# Patient Record
Sex: Male | Born: 1965 | Race: Black or African American | Hispanic: No | Marital: Married | Smoking: Never smoker
Health system: Southern US, Community
[De-identification: ages and names within clinical notes are randomized; demographics above are authoritative.]

## PROBLEM LIST (undated history)

## (undated) DIAGNOSIS — I639 Cerebral infarction, unspecified: Secondary | ICD-10-CM

## (undated) DIAGNOSIS — N289 Disorder of kidney and ureter, unspecified: Secondary | ICD-10-CM

## (undated) DIAGNOSIS — M109 Gout, unspecified: Secondary | ICD-10-CM

## (undated) DIAGNOSIS — I10 Essential (primary) hypertension: Secondary | ICD-10-CM

## (undated) HISTORY — PX: ANKLE FRACTURE SURGERY: SHX122

---

## 2007-05-29 ENCOUNTER — Emergency Department (HOSPITAL_COMMUNITY): Admission: EM | Admit: 2007-05-29 | Discharge: 2007-05-29 | Payer: Self-pay | Admitting: Emergency Medicine

## 2008-08-05 ENCOUNTER — Ambulatory Visit (HOSPITAL_BASED_OUTPATIENT_CLINIC_OR_DEPARTMENT_OTHER): Admission: RE | Admit: 2008-08-05 | Discharge: 2008-08-05 | Payer: Self-pay | Admitting: Internal Medicine

## 2008-08-10 ENCOUNTER — Ambulatory Visit: Payer: Self-pay | Admitting: Internal Medicine

## 2008-11-25 ENCOUNTER — Ambulatory Visit: Payer: Self-pay | Admitting: Diagnostic Radiology

## 2008-11-26 ENCOUNTER — Encounter: Payer: Self-pay | Admitting: Emergency Medicine

## 2008-11-26 ENCOUNTER — Ambulatory Visit: Payer: Self-pay | Admitting: Internal Medicine

## 2008-11-26 ENCOUNTER — Ambulatory Visit: Payer: Self-pay | Admitting: Diagnostic Radiology

## 2008-11-26 ENCOUNTER — Inpatient Hospital Stay (HOSPITAL_COMMUNITY): Admission: EM | Admit: 2008-11-26 | Discharge: 2008-11-29 | Payer: Self-pay | Admitting: Internal Medicine

## 2009-03-27 ENCOUNTER — Emergency Department (HOSPITAL_COMMUNITY): Admission: EM | Admit: 2009-03-27 | Discharge: 2009-03-27 | Payer: Self-pay | Admitting: Emergency Medicine

## 2010-02-02 ENCOUNTER — Encounter: Payer: Self-pay | Admitting: Internal Medicine

## 2010-04-06 LAB — POCT I-STAT, CHEM 8
BUN: 12 mg/dL (ref 6–23)
Chloride: 112 mEq/L (ref 96–112)
Creatinine, Ser: <0.2 mg/dL — ABNORMAL LOW (ref 0.4–1.5)
Glucose, Bld: 97 mg/dL (ref 70–99)
Potassium: 5.4 mEq/L — ABNORMAL HIGH (ref 3.5–5.1)
Sodium: 132 mEq/L — ABNORMAL LOW (ref 135–145)

## 2010-04-06 LAB — DIFFERENTIAL
Basophils Absolute: 0 10*3/uL (ref 0.0–0.1)
Basophils Relative: 0 % (ref 0–1)
Eosinophils Relative: 0 % (ref 0–5)
Lymphocytes Relative: 23 % (ref 12–46)

## 2010-04-06 LAB — CBC
HCT: 40.2 % (ref 39.0–52.0)
MCHC: 33.1 g/dL (ref 30.0–36.0)
Platelets: 209 10*3/uL (ref 150–400)
RDW: 13.5 % (ref 11.5–15.5)

## 2010-04-06 LAB — URINALYSIS, ROUTINE W REFLEX MICROSCOPIC
Ketones, ur: NEGATIVE mg/dL
Leukocytes, UA: NEGATIVE
Nitrite: NEGATIVE
Protein, ur: 100 mg/dL — AB
Urobilinogen, UA: 1 mg/dL (ref 0.0–1.0)
pH: 7.5 (ref 5.0–8.0)

## 2010-04-06 LAB — URINE MICROSCOPIC-ADD ON

## 2010-04-15 LAB — BASIC METABOLIC PANEL
BUN: 12 mg/dL (ref 6–23)
BUN: 11 mg/dL (ref 6–23)
CO2: 25 mEq/L (ref 19–32)
CO2: 29 mEq/L (ref 19–32)
CO2: 30 mEq/L (ref 19–32)
CO2: 27 mEq/L (ref 19–32)
Calcium: 9.3 mg/dL (ref 8.4–10.5)
Chloride: 106 mEq/L (ref 96–112)
Chloride: 108 mEq/L (ref 96–112)
Chloride: 105 mEq/L (ref 96–112)
Creatinine, Ser: 2.03 mg/dL — ABNORMAL HIGH (ref 0.4–1.5)
Creatinine, Ser: 1.4 mg/dL (ref 0.4–1.5)
GFR calc Af Amer: 43 mL/min — ABNORMAL LOW (ref >60)
GFR calc Af Amer: 59 mL/min — ABNORMAL LOW (ref >60)
GFR calc Af Amer: >60 mL/min (ref >60)
GFR calc Af Amer: >60 mL/min (ref >60)
GFR calc non Af Amer: 36 mL/min — ABNORMAL LOW (ref >60)
GFR calc non Af Amer: 54 mL/min — ABNORMAL LOW (ref >60)
GFR calc non Af Amer: 55 mL/min — ABNORMAL LOW (ref >60)
Glucose, Bld: 104 mg/dL — ABNORMAL HIGH (ref 70–99)
Glucose, Bld: 94 mg/dL (ref 70–99)
Glucose, Bld: 98 mg/dL (ref 70–99)
Potassium: 4.1 mEq/L (ref 3.5–5.1)
Potassium: 4.2 mEq/L (ref 3.5–5.1)
Potassium: 4.3 mEq/L (ref 3.5–5.1)
Potassium: 3.9 mEq/L (ref 3.5–5.1)
Sodium: 138 mEq/L (ref 135–145)
Sodium: 139 mEq/L (ref 135–145)
Sodium: 141 mEq/L (ref 135–145)
Sodium: 142 mEq/L (ref 135–145)

## 2010-04-15 LAB — SODIUM, URINE, RANDOM: Sodium, Ur: 26 mEq/L

## 2010-04-15 LAB — URINALYSIS, ROUTINE W REFLEX MICROSCOPIC
Bilirubin Urine: NEGATIVE
Glucose, UA: NEGATIVE mg/dL
Hgb urine dipstick: NEGATIVE
Ketones, ur: NEGATIVE mg/dL
pH: 5.5 (ref 5.0–8.0)

## 2010-04-15 LAB — CBC
HCT: 32.6 % — ABNORMAL LOW (ref 39.0–52.0)
HCT: 33.4 % — ABNORMAL LOW (ref 39.0–52.0)
HCT: 34.3 % — ABNORMAL LOW (ref 39.0–52.0)
HCT: 38.8 % — ABNORMAL LOW (ref 39.0–52.0)
Hemoglobin: 10.9 g/dL — ABNORMAL LOW (ref 13.0–17.0)
Hemoglobin: 10.9 g/dL — ABNORMAL LOW (ref 13.0–17.0)
Hemoglobin: 11.3 g/dL — ABNORMAL LOW (ref 13.0–17.0)
Hemoglobin: 12.8 g/dL — ABNORMAL LOW (ref 13.0–17.0)
MCHC: 33.4 g/dL (ref 30.0–36.0)
MCHC: 32.9 g/dL (ref 30.0–36.0)
MCV: 89.9 fL (ref 78.0–100.0)
MCV: 90.4 fL (ref 78.0–100.0)
MCV: 89.1 fL (ref 78.0–100.0)
Platelets: 226 10*3/uL (ref 150–400)
Platelets: 231 10*3/uL (ref 150–400)
RBC: 3.63 MIL/uL — ABNORMAL LOW (ref 4.22–5.81)
RBC: 3.71 MIL/uL — ABNORMAL LOW (ref 4.22–5.81)
RBC: 3.99 MIL/uL — ABNORMAL LOW (ref 4.22–5.81)
RBC: 4.36 MIL/uL (ref 4.22–5.81)
RDW: 13.8 % (ref 11.5–15.5)
RDW: 14.2 % (ref 11.5–15.5)
RDW: 13.1 % (ref 11.5–15.5)
WBC: 7.3 10*3/uL (ref 4.0–10.5)
WBC: 8.1 10*3/uL (ref 4.0–10.5)

## 2010-04-15 LAB — DIFFERENTIAL
Basophils Absolute: 0 10*3/uL (ref 0.0–0.1)
Basophils Relative: 0 % (ref 0–1)
Basophils Relative: 1 % (ref 0–1)
Eosinophils Absolute: 0.1 10*3/uL (ref 0.0–0.7)
Eosinophils Absolute: 0 10*3/uL (ref 0.0–0.7)
Eosinophils Relative: 1 % (ref 0–5)
Eosinophils Relative: 1 % (ref 0–5)
Lymphocytes Relative: 35 % (ref 12–46)
Lymphocytes Relative: 23 % (ref 12–46)
Lymphs Abs: 1.9 10*3/uL (ref 0.7–4.0)
Monocytes Absolute: 0.7 10*3/uL (ref 0.1–1.0)
Monocytes Relative: 6 % (ref 3–12)
Monocytes Relative: 9 % (ref 3–12)
Neutro Abs: 4.1 10*3/uL (ref 1.7–7.7)
Neutro Abs: 5.5 10*3/uL (ref 1.7–7.7)
Neutrophils Relative %: 67 % (ref 43–77)

## 2010-04-15 LAB — POCT CARDIAC MARKERS
CKMB, poc: <1 ng/mL — ABNORMAL LOW (ref 1.0–8.0)
CKMB, poc: <1 ng/mL — ABNORMAL LOW (ref 1.0–8.0)
Myoglobin, poc: 83.6 ng/mL (ref 12–200)
Myoglobin, poc: 99.5 ng/mL (ref 12–200)
Troponin i, poc: <0.05 ng/mL (ref 0.00–0.09)
Troponin i, poc: <0.05 ng/mL (ref 0.00–0.09)

## 2010-04-15 LAB — URINE MICROSCOPIC-ADD ON

## 2010-04-15 LAB — RETICULOCYTES
RBC.: 4.09 MIL/uL — ABNORMAL LOW (ref 4.22–5.81)
Retic Count, Absolute: 81.8 10*3/uL (ref 19.0–186.0)
Retic Ct Pct: 2 % (ref 0.4–3.1)

## 2010-04-15 LAB — APTT: aPTT: 29 seconds (ref 24–37)

## 2010-04-15 LAB — HEPATIC FUNCTION PANEL
AST: 12 U/L (ref 0–37)
Albumin: 3.7 g/dL (ref 3.5–5.2)
Alkaline Phosphatase: 19 U/L — ABNORMAL LOW (ref 39–117)
Total Bilirubin: 0.5 mg/dL (ref 0.3–1.2)
Total Protein: 7.1 g/dL (ref 6.0–8.3)

## 2010-04-15 LAB — LIPID PANEL
Cholesterol: 207 mg/dL — ABNORMAL HIGH (ref 0–200)
HDL: 34 mg/dL — ABNORMAL LOW (ref >39)
LDL Cholesterol: 150 mg/dL — ABNORMAL HIGH (ref 0–99)
Total CHOL/HDL Ratio: 6.1 RATIO

## 2010-04-15 LAB — VITAMIN B12: Vitamin B-12: 219 pg/mL (ref 211–911)

## 2010-04-15 LAB — PROTIME-INR
INR: 1.08 (ref 0.00–1.49)
INR: 1.19 (ref 0.00–1.49)
INR: 1.06 (ref 0.00–1.49)
Prothrombin Time: 13.9 seconds (ref 11.6–15.2)
Prothrombin Time: 13.7 seconds (ref 11.6–15.2)

## 2010-04-15 LAB — CARDIAC PANEL(CRET KIN+CKTOT+MB+TROPI)
CK, MB: 0.9 ng/mL (ref 0.3–4.0)
Relative Index: 0.7 (ref 0.0–2.5)
Total CK: 123 U/L (ref 7–232)
Total CK: 137 U/L (ref 7–232)
Troponin I: 0.02 ng/mL (ref 0.00–0.06)

## 2010-04-15 LAB — TSH: TSH: 1.316 u[IU]/mL (ref 0.350–4.500)

## 2010-04-15 LAB — URINE CULTURE

## 2010-04-15 LAB — HEPARIN LEVEL (UNFRACTIONATED)
Heparin Unfractionated: 0.44 IU/mL (ref 0.30–0.70)
Heparin Unfractionated: 0.49 IU/mL (ref 0.30–0.70)

## 2010-04-15 LAB — IRON AND TIBC: Iron: 107 ug/dL (ref 42–135)

## 2010-05-26 NOTE — Procedures (Signed)
NAME:  DECARI, DUGGAR NO.:  [id number]   MEDICAL RECORD NO.:  [id number]          PATIENT TYPE:  OUT   LOCATION:  SLEEP CENTER                 FACILITY:  [facility]   PHYSICIAN:  Clinton D. Maple Hudson, MD, FCCP, FACPDATE OF BIRTH:  06/20/1965   DATE OF STUDY:  08/05/2008                            NOCTURNAL POLYSOMNOGRAM   REFERRING PHYSICIAN:  Ike Chukwu [name]   INDICATION FOR STUDY:  Hypersomnia with sleep apnea.   EPWORTH SLEEPINESS SCORE:  Epworth sleepiness score 4/24, BMI 25.1.  Weight 180 pounds, height 71 inches.  Neck 16 inches.   MEDICATIONS:  Home medication charted and reviewed.   SLEEP ARCHITECTURE:  Total sleep time 321.5 minutes with sleep  efficiency 89.8%.  Stage I was 7.9%, stage II 59.7%, stage III 13.7%,  REM 18.7% of total sleep time.  Sleep latency 7 minutes.  REM latency  64.5 minutes.  Awake after sleep onset 29.5 minutes.  Arousal index 22.  No bedtime medication taken.   RESPIRATORY DATA:  Apnea/hypopnea index (AHI) 5.2 per hour.  A total of  28 events were scored including 6 obstructive apneas and 22 hypopneas.  Events were significantly more common while supine.  REM AHI 2 per hour.  An additional 34 marginal events, not meeting duration and intensity  criteria to be scored as apneas or hypopneas, were recognized as  respiratory events related to arousal for a cumulative respiratory  disturbance index (RDI) of 11.6 per hour.  This was a diagnostic NPSG  study.   OXYGEN DATA:  Moderately loud snoring with oxygen desaturation to a  nadir of 89%.  Mean oxygen saturation through the study was 96.6% on  room air.   CARDIAC DATA:  Sinus rhythm.   MOVEMENT-PARASOMNIA:  No movement disturbance.  No bathroom trips.   IMPRESSIONS-RECOMMENDATIONS:  1. Minimal obstructive sleep apnea/hypopnea syndrome, AHI 5.2 per hour      (normal range 0-5 per hour), RDI 11.6 per hour.  Events were      related to supine sleep position.  Loud snoring with  oxygen      desaturation to a nadir of 89% on room air.  2. Current scoring rules would recognize an RDI of 11.6 to be      consistent with a diagnosis of mild obstructive sleep      apnea/hypopnea syndrome which can be addressed with a trial of CPAP      therapy if otherwise, clinically      appropriate and if more conservative measures are insufficient.      Consider return for CPAP titration or evaluate for alternative      management as indicated.      Clinton D. Maple Hudson, MD, [facility], FACP  Diplomate, Biomedical engineer of Sleep Medicine  Electronically Signed     CDY/MEDQ  D:  08/11/2008 09:56:41  T:  08/11/2008 11:00:01  Job:  [id number]

## 2011-03-02 ENCOUNTER — Emergency Department (INDEPENDENT_AMBULATORY_CARE_PROVIDER_SITE_OTHER): Payer: BC Managed Care – PPO

## 2011-03-02 ENCOUNTER — Emergency Department (HOSPITAL_BASED_OUTPATIENT_CLINIC_OR_DEPARTMENT_OTHER)
Admission: EM | Admit: 2011-03-02 | Discharge: 2011-03-02 | Disposition: A | Discharge status: Home / Self Care | Payer: BC Managed Care – PPO | Attending: Emergency Medicine | Admitting: Emergency Medicine

## 2011-03-02 ENCOUNTER — Encounter (HOSPITAL_BASED_OUTPATIENT_CLINIC_OR_DEPARTMENT_OTHER): Payer: Self-pay | Admitting: *Deleted

## 2011-03-02 ENCOUNTER — Other Ambulatory Visit: Payer: Self-pay

## 2011-03-02 DIAGNOSIS — R0602 Shortness of breath: Secondary | ICD-10-CM

## 2011-03-02 DIAGNOSIS — I10 Essential (primary) hypertension: Secondary | ICD-10-CM | POA: Insufficient documentation

## 2011-03-02 DIAGNOSIS — R0789 Other chest pain: Secondary | ICD-10-CM

## 2011-03-02 DIAGNOSIS — R079 Chest pain, unspecified: Secondary | ICD-10-CM

## 2011-03-02 HISTORY — DX: Essential (primary) hypertension: I10

## 2011-03-02 LAB — COMPREHENSIVE METABOLIC PANEL
ALT: 13 U/L (ref 0–53)
AST: 24 U/L (ref 0–37)
Albumin: 4.2 g/dL (ref 3.5–5.2)
Alkaline Phosphatase: 29 U/L — ABNORMAL LOW (ref 39–117)
BUN: 10 mg/dL (ref 6–23)
Chloride: 101 mEq/L (ref 96–112)
Potassium: 3.8 mEq/L (ref 3.5–5.1)
Sodium: 139 mEq/L (ref 135–145)
Total Bilirubin: 0.3 mg/dL (ref 0.3–1.2)
Total Protein: 7.6 g/dL (ref 6.0–8.3)

## 2011-03-02 LAB — DIFFERENTIAL
Basophils Absolute: 0 10*3/uL (ref 0.0–0.1)
Basophils Relative: 0 % (ref 0–1)
Eosinophils Absolute: 0.1 10*3/uL (ref 0.0–0.7)
Monocytes Relative: 10 % (ref 3–12)
Neutro Abs: 3.6 10*3/uL (ref 1.7–7.7)
Neutrophils Relative %: 51 % (ref 43–77)

## 2011-03-02 LAB — CBC
Hemoglobin: 12.7 g/dL — ABNORMAL LOW (ref 13.0–17.0)
MCH: 29.1 pg (ref 26.0–34.0)
MCHC: 34.7 g/dL (ref 30.0–36.0)
Platelets: 201 10*3/uL (ref 150–400)
RDW: 12.5 % (ref 11.5–15.5)

## 2011-03-02 LAB — TROPONIN I: Troponin I: <0.3 ng/mL (ref <0.30)

## 2011-03-02 NOTE — ED Notes (Signed)
Pt to room 1, pt is alert and in nad. Pt reports chest "burning" since Sunday, worse after eating and with laying down at night. Denies any sob. Pt states "burning" also increases with lying on side.

## 2011-03-02 NOTE — Discharge Instructions (Signed)
Prilosec 20 mg twice daily for the next two weeks.      Chest Pain (Nonspecific) It is often hard to give a specific diagnosis for the cause of chest pain. There is always a chance that your pain could be related to something serious, such as a heart attack or a blood clot in the lungs. You need to follow up with your caregiver for further evaluation. CAUSES   Heartburn.   Pneumonia or bronchitis.   Anxiety and stress.   Inflammation around your heart (pericarditis) or lung (pleuritis or pleurisy).   A blood clot in the lung.   A collapsed lung (pneumothorax). It can develop suddenly on its own (spontaneous pneumothorax) or from injury (trauma) to the chest.  The chest wall is composed of bones, muscles, and cartilage. Any of these can be the source of the pain.  The bones can be bruised by injury.   The muscles or cartilage can be strained by coughing or overwork.   The cartilage can be affected by inflammation and become sore (costochondritis).  DIAGNOSIS  Lab tests or other studies, such as X-rays, an EKG, stress testing, or cardiac imaging, may be needed to find the cause of your pain.  TREATMENT   Treatment depends on what may be causing your chest pain. Treatment may include:   Acid blockers for heartburn.   Anti-inflammatory medicine.   Pain medicine for inflammatory conditions.   Antibiotics if an infection is present.   You may be advised to change lifestyle habits. This includes stopping smoking and avoiding caffeine and chocolate.   You may be advised to keep your head raised (elevated) when sleeping. This reduces the chance of acid going backward from your stomach into your esophagus.   Most of the time, nonspecific chest pain will improve within 2 to 3 days with rest and mild pain medicine.  HOME CARE INSTRUCTIONS   If antibiotics were prescribed, take the full amount even if you start to feel better.   For the next few days, avoid physical activities  that bring on chest pain. Continue physical activities as directed.   Do not smoke cigarettes or drink alcohol until your symptoms are gone.   Only take over-the-counter or prescription medicine for pain, discomfort, or fever as directed by your caregiver.   Follow your caregiver's suggestions for further testing if your chest pain does not go away.   Keep any follow-up appointments you made. If you do not go to an appointment, you could develop lasting (chronic) problems with pain. If there is any problem keeping an appointment, you must call to reschedule.  SEEK MEDICAL CARE IF:   You think you are having problems from the medicine you are taking. Read your medicine instructions carefully.   Your chest pain does not go away, even after treatment.   You develop a rash with blisters on your chest.  SEEK IMMEDIATE MEDICAL CARE IF:   You have increased chest pain or pain that spreads to your arm, neck, jaw, back, or belly (abdomen).   You develop shortness of breath, an increasing cough, or you are coughing up blood.   You have severe back or abdominal pain, feel sick to your stomach (nauseous) or throw up (vomit).   You develop severe weakness, fainting, or chills.   You have an oral temperature above 102 F (38.9 C), not controlled by medicine.  THIS IS AN EMERGENCY. Do not wait to see if the pain will go away. Get medical help  at once. Call your local emergency services (911 in [place] Do not drive yourself to the hospital. MAKE SURE YOU:   Understand these instructions.   Will watch your condition.   Will get help right away if you are not doing well or get worse.  Document Released: 10/07/2004 Document Revised: 09/09/2010 Document Reviewed: 08/03/2007 [facility] Patient Information 2012 [place], [place].

## 2011-03-02 NOTE — ED Provider Notes (Signed)
History     CSN: [id number]  Arrival date & time 03/02/11  1753   First MD Initiated Contact with Patient 03/02/11 1814      Chief Complaint  Patient presents with  . Chest Pain    (Consider location/radiation/quality/duration/timing/severity/associated sxs/prior treatment) HPI Comments: Several day history of "burning in chest".  Worse with position, lying flat, certain foods.  Denies sob, diaphoresis, nausea, or radiation to arm or jaw.  No exertional component.  Had normal heart cath 2 years ago.  Patient is a 46 y.o. male presenting with chest pain. The history is provided by the patient.  Chest Pain The severity of the pain is moderate. The quality of the pain is described as burning. The pain does not radiate. Chest pain is worsened by certain positions. Pertinent negatives for primary symptoms include no fever, no cough, no nausea, no vomiting and no dizziness. He tried nothing for the symptoms.     Past Medical History  Diagnosis Date  . Hypertension     History reviewed. No pertinent past surgical history.  History reviewed. No pertinent family history.  History  Substance Use Topics  . Smoking status: Never Smoker   . Smokeless tobacco: Not on file  . Alcohol Use: No      Review of Systems  Constitutional: Negative for fever.  Respiratory: Negative for cough.   Cardiovascular: Positive for chest pain.  Gastrointestinal: Negative for nausea and vomiting.  Neurological: Negative for dizziness.  All other systems reviewed and are negative.    Allergies  Review of patient's allergies indicates no known allergies.  Home Medications  No current outpatient prescriptions on file.  There were no vitals taken for this visit.  Physical Exam  Nursing note and vitals reviewed. Constitutional: He is oriented to person, place, and time. He appears well-developed and well-nourished. No distress.  HENT:  Head: Normocephalic and atraumatic.  Mouth/Throat:  Oropharynx is clear and moist.  Neck: Normal range of motion. Neck supple.  Cardiovascular: Normal rate and regular rhythm.   No murmur heard. Pulmonary/Chest: Effort normal and breath sounds normal. No respiratory distress. He has no wheezes.  Abdominal: Soft. Bowel sounds are normal. He exhibits no distension. There is no tenderness.  Musculoskeletal: Normal range of motion. He exhibits no edema.  Neurological: He is alert and oriented to person, place, and time.  Skin: Skin is warm and dry. He is not diaphoretic.    ED Course  Procedures (including critical care time)  Labs Reviewed - No data to display No results found.   No diagnosis found.   Date: 03/02/2011  Rate: 89  Rhythm: normal sinus rhythm  QRS Axis: left  Intervals: normal  ST/T Wave abnormalities: normal  Conduction Disutrbances:none  Narrative Interpretation:   Old EKG Reviewed: unchanged    MDM  The labs and ekg look okay with the exception of the glucose of 60 (he was given food).  The chest xray looks okay as well.  He had a cath done 2 years ago that was unremarkable.  Given that and the fact that his symptoms are atypical, I believe the patient can be safely discharged.  I will recommend he try prilosec twice daily for the next two weeks and follow up prn.        Geoffery Lyons, MD 03/02/11 [phone]

## 2011-10-13 ENCOUNTER — Emergency Department (HOSPITAL_BASED_OUTPATIENT_CLINIC_OR_DEPARTMENT_OTHER)
Admission: EM | Admit: 2011-10-13 | Discharge: 2011-10-14 | Disposition: A | Discharge status: Home / Self Care | Payer: BC Managed Care – PPO | Attending: Emergency Medicine | Admitting: Emergency Medicine

## 2011-10-13 ENCOUNTER — Encounter (HOSPITAL_BASED_OUTPATIENT_CLINIC_OR_DEPARTMENT_OTHER): Payer: Self-pay | Admitting: *Deleted

## 2011-10-13 DIAGNOSIS — R51 Headache: Secondary | ICD-10-CM

## 2011-10-13 DIAGNOSIS — I10 Essential (primary) hypertension: Secondary | ICD-10-CM

## 2011-10-13 MED ORDER — LISINOPRIL 10 MG PO TABS
10.0000 mg | ORAL_TABLET | Freq: Once | ORAL | Status: AC
  Administered 2011-10-13: 10 mg via ORAL
  Filled 2011-10-13: qty 1

## 2011-10-13 MED ORDER — ACETAMINOPHEN 325 MG PO TABS
650.0000 mg | ORAL_TABLET | Freq: Once | ORAL | Status: AC
  Administered 2011-10-13: 650 mg via ORAL
  Filled 2011-10-13: qty 2

## 2011-10-13 NOTE — ED Notes (Addendum)
Pt c/o h/a  Today not taking  BP meds

## 2011-10-13 NOTE — ED Provider Notes (Signed)
History     CSN: [id number]  Arrival date & time 10/13/11  4098   First MD Initiated Contact with Patient 10/13/11 2140      Chief Complaint  Patient presents with  . Headache    (Consider location/radiation/quality/duration/timing/severity/associated sxs/prior treatment) HPI Kevin Jefferson is a 46 y.o. male past medical history of hypertension who has previously seen a nephrologist and was previously taking 3 separate pills, for medications for hypertension and has not taken any of them in the last 2 days.  Patient was at [facility] when he noticed his blood pressure was elevated. He said he would come to the emergency department for further evaluation. Just complaining about headache he says is 8/10, as always associated with an elevated blood pressure, had a slow onset, typically is relieved by pain medicine and laying down, is located in the frontal region and all over the top of the head, is a squeezing headache, not associated with changes in vision, numbness, tingling, dysarthria or weakness. Patient denies any chest pain or shortness of breath, no nausea vomiting or diarrhea.   Past Medical History  Diagnosis Date  . Hypertension     History reviewed. No pertinent past surgical history.  History reviewed. No pertinent family history.  History  Substance Use Topics  . Smoking status: Never Smoker   . Smokeless tobacco: Not on file  . Alcohol Use: No      Review of Systems At least 10pt or greater review of systems completed and are negative except where specified in the HPI.  Allergies  Review of patient's allergies indicates no known allergies.  Home Medications   Current Outpatient Rx  Name Route Sig Dispense Refill  . AMLODIPINE BESYLATE 10 MG PO TABS Oral Take 10 mg by mouth daily.    . BUPROPION HCL ER (SR) 150 MG PO TB12 Oral Take 150 mg by mouth 2 (two) times daily.    Marland Kitchen LISINOPRIL 10 MG PO TABS Oral Take 10 mg by mouth 2 (two) times daily.    .  TRIAMTERENE-HCTZ 37.5-25 MG PO CAPS Oral Take 1 capsule by mouth every morning.      BP 176/109  Pulse 78  Temp 98.7 F (37.1 C) (Oral)  Resp 20  Ht 5\' 11"  (1.803 m)  Wt 180 lb (81.647 kg)  BMI 25.10 kg/m2  SpO2 99%  Physical Exam  Nursing notes reviewed.  Electronic medical record reviewed. VITAL SIGNS:   Filed Vitals:   10/13/11 2004 10/13/11 2142  BP: 181/119 176/109  Pulse: 76 78  Temp: 98.7 F (37.1 C)   TempSrc: Oral   Resp: 20   Height: 5\' 11"  (1.803 m)   Weight: 180 lb (81.647 kg)   SpO2: 99% 99%   CONSTITUTIONAL: Awake, oriented, appears non-toxic HENT: Atraumatic, normocephalic, oral mucosa pink and moist, airway patent. Nares patent without drainage. External ears normal. EYES: Conjunctiva clear, EOMI, PERRLA NECK: Trachea midline, non-tender, supple CARDIOVASCULAR: Normal heart rate, Normal rhythm, No murmurs, rubs, gallops PULMONARY/CHEST: Clear to auscultation, no rhonchi, wheezes, or rales. Symmetrical breath sounds. Non-tender. ABDOMINAL: Non-distended, soft, non-tender - no rebound or guarding.  BS normal. NEUROLOGIC:CN: Facial sensation equal to light touch bilaterally.  Good muscle bulk in the masseter muscle and good lateral movement of the jaw.  Facial expressions equal and good strength with smile/frown and puffed cheeks.  Hearing grossly intact to finger rub test.  Uvula, tongue are midline with no deviation. Symmetrical palate elevation.  Trapezius and SCM muscles are 5/5 strength bilaterally.  Strength: 5/5 strength flexors and extensors in the upper and lower extremities.  Grip strength, finger adduction/abduction 5/5. Sensation: Sensation intact distally to light touch Cerebellar: No ataxia with walking or dysmetria with finger to nose, rapid alternating hand movements and heels to shin testing. EXTREMITIES: No clubbing, cyanosis, or edema SKIN: Warm, Dry, No erythema, No rash  ED Course  Procedures (including critical care time)  Labs  Reviewed - No data to display No results found.   No diagnosis found.  MDM  Nichole Keltner is a 46 y.o. male with hypertension who has not taken his hypertensive medications in 2 days and presents to the ER because he is concerned that he has high blood pressure and a headache.  Patient was counseled that if he does not take his blood pressure medication, his blood pressure will likely be elevated. Patient was encouraged to make lifestyle changes increasing healthy diet and increasing exercise to potentially reduce the number of medications he must take. He is urged to followup with his nephrologist or primary care physician of choice. Given him one lisinopril which he says he's been taking because he has not been taking the other medications. I do not suspect any end organ damage at this time and do not think any laboratory studies are needed as the patient does not have any chest pain, cardiac symptoms, and he has a normal neurologic exam  I explained the diagnosis and have given explicit precautions to return to the ER including chest pain, shortness of breath, weakness, numbness, dysarthria  or any other new or worsening symptoms. The patient understands and accepts the medical plan as it's been dictated and I have answered their questions. Discharge instructions concerning home care and prescriptions have been given.  The patient is STABLE and is discharged to home in good condition.       Jones Skene, MD 10/20/11 1147

## 2011-10-29 DIAGNOSIS — I517 Cardiomegaly: Secondary | ICD-10-CM | POA: Insufficient documentation

## 2012-02-13 ENCOUNTER — Encounter (HOSPITAL_BASED_OUTPATIENT_CLINIC_OR_DEPARTMENT_OTHER): Payer: Self-pay | Admitting: *Deleted

## 2012-02-13 ENCOUNTER — Emergency Department (HOSPITAL_BASED_OUTPATIENT_CLINIC_OR_DEPARTMENT_OTHER)
Admission: EM | Admit: 2012-02-13 | Discharge: 2012-02-13 | Disposition: A | Discharge status: Home / Self Care | Payer: BC Managed Care – PPO | Attending: Emergency Medicine | Admitting: Emergency Medicine

## 2012-02-13 ENCOUNTER — Emergency Department (HOSPITAL_BASED_OUTPATIENT_CLINIC_OR_DEPARTMENT_OTHER): Payer: BC Managed Care – PPO

## 2012-02-13 DIAGNOSIS — Y939 Activity, unspecified: Secondary | ICD-10-CM | POA: Insufficient documentation

## 2012-02-13 DIAGNOSIS — W010XXA Fall on same level from slipping, tripping and stumbling without subsequent striking against object, initial encounter: Secondary | ICD-10-CM | POA: Insufficient documentation

## 2012-02-13 DIAGNOSIS — I10 Essential (primary) hypertension: Secondary | ICD-10-CM | POA: Insufficient documentation

## 2012-02-13 DIAGNOSIS — S7000XA Contusion of unspecified hip, initial encounter: Secondary | ICD-10-CM | POA: Insufficient documentation

## 2012-02-13 DIAGNOSIS — Y929 Unspecified place or not applicable: Secondary | ICD-10-CM | POA: Insufficient documentation

## 2012-02-13 MED ORDER — HYDROCODONE-ACETAMINOPHEN 5-325 MG PO TABS: 1.0000 | ORAL_TABLET | Freq: Four times a day (QID) | ORAL | Status: DC | PRN

## 2012-02-13 MED ORDER — NAPROXEN 500 MG PO TABS: 500.0000 mg | ORAL_TABLET | Freq: Two times a day (BID) | ORAL | Status: DC

## 2012-02-13 NOTE — ED Provider Notes (Signed)
History  This chart was scribed for Shelda Jakes, MD by Erskine Emery, ED Scribe. This patient was seen in room MH01/MH01 and the patient's care was started at 21:08.   CSN: [id number]  Arrival date & time 02/13/12  2054   First MD Initiated Contact with Patient 02/13/12 2108      Chief Complaint  Patient presents with  . Hip Injury    (Consider location/radiation/quality/duration/timing/severity/associated sxs/prior treatment) The history is provided by the patient. No language interpreter was used.  Kevin Jefferson is a 47 y.o. male who presents to the Emergency Department complaining of right hip and waist line pain since he tripped on a curb and fell from standing onto his right side yesterday. Pt reports the pain is about an 8/10 when moving and last night it felt like the whole front of his leg was burning. Pt reports some back pain upon walking and right leg pain but denies any associated abdominal pain, nausea, emesis, diarrhea, neck pain, headache, visual changes, dysuria, hematuria, rash, h/o bleeding easily, injury to the area in the past, or pain in foot.  Past Medical History  Diagnosis Date  . Hypertension     History reviewed. No pertinent past surgical history.  History reviewed. No pertinent family history.  History  Substance Use Topics  . Smoking status: Never Smoker   . Smokeless tobacco: Not on file  . Alcohol Use: No      Review of Systems  Constitutional: Negative for fever and chills.  HENT: Negative for congestion, rhinorrhea and neck pain.   Eyes: Negative for visual disturbance.  Respiratory: Negative for cough and shortness of breath.   Cardiovascular: Negative for chest pain.  Gastrointestinal: Negative for nausea, vomiting and diarrhea.  Genitourinary: Negative for dysuria and hematuria.  Musculoskeletal: Positive for back pain.       Right hip pain. Right leg pain and burning.  Skin: Negative for rash.  Neurological: Negative for  headaches.  Hematological: Does not bruise/bleed easily.  Psychiatric/Behavioral: Negative for sleep disturbance.  All other systems reviewed and are negative.    Allergies  Review of patient's allergies indicates no known allergies.  Home Medications   Current Outpatient Rx  Name  Route  Sig  Dispense  Refill  . AMLODIPINE BESYLATE 10 MG PO TABS   Oral   Take 10 mg by mouth daily.         . BUPROPION HCL ER (SR) 150 MG PO TB12   Oral   Take 150 mg by mouth 2 (two) times daily.         Marland Kitchen HYDROCODONE-ACETAMINOPHEN 5-325 MG PO TABS   Oral   Take 1-2 tablets by mouth every 6 (six) hours as needed for pain.   14 tablet   0   . LISINOPRIL 10 MG PO TABS   Oral   Take 10 mg by mouth 2 (two) times daily.         Marland Kitchen NAPROXEN 500 MG PO TABS   Oral   Take 1 tablet (500 mg total) by mouth 2 (two) times daily.   14 tablet   0   . TRIAMTERENE-HCTZ 37.5-25 MG PO CAPS   Oral   Take 1 capsule by mouth every morning.           Triage Vitals: BP 143/81  Pulse 97  Temp 98.5 F (36.9 C) (Oral)  Resp 20  Ht 5\' 11"  (1.803 m)  Wt 180 lb (81.647 kg)  BMI 25.10 kg/m2  SpO2 96%  Physical Exam  Nursing note and vitals reviewed. Constitutional: He is oriented to person, place, and time. He appears well-developed and well-nourished. No distress.  HENT:  Head: Normocephalic and atraumatic.  Eyes: EOM are normal. Pupils are equal, round, and reactive to light.  Neck: Neck supple. No tracheal deviation present.  Cardiovascular: Normal rate, regular rhythm and normal heart sounds.   No murmur heard. Pulmonary/Chest: Effort normal and breath sounds normal. No respiratory distress. He has no wheezes.       Lungs are clear.  Abdominal: Soft. Bowel sounds are normal. He exhibits no distension.       No RUQ, RLQ, or left-sided pain. Pain at groin crease on right.  Musculoskeletal: Normal range of motion. He exhibits no edema.       2+ DP pulse on both feet.  Neurological: He is  alert and oriented to person, place, and time.  Skin: Skin is warm and dry.  Psychiatric: He has a normal mood and affect.    ED Course  Procedures (including critical care time) DIAGNOSTIC STUDIES: Oxygen Saturation is 96% on room air, adeqwuate by my interpretation.    COORDINATION OF CARE: 21:20--I evaluated the patient and we discussed a treatment plan including hip x-ray to which the pt agreed.   Dg Hip Complete Right  02/13/2012  *RADIOLOGY REPORT*  Clinical Data: Larey Seat 1 day ago.  Right hip pain.  RIGHT HIP - COMPLETE 2+ VIEW  Comparison: None.  Findings: No fracture or dislocation.  IMPRESSION: No fracture.   Original Report Authenticated By: Lacy Duverney, M.D.       1. Contusion, hip       MDM   X-rays are negative for any bony injury to the right hip suspect a contusion to the hip area. No evidence of any intra-abdominal injury on exam. We'll treat with anti-inflammatories and pain medication patient will followup if not improving in one week.      I personally performed the services described in this documentation, which was scribed in my presence. The recorded information has been reviewed and is accurate.     Shelda Jakes, MD 02/14/12 [phone]

## 2012-02-13 NOTE — ED Notes (Signed)
Pt states he fell yesterday and injured his right hip. Ambulatory with limp. Also c/o right side pain.

## 2012-02-13 NOTE — ED Notes (Signed)
Transported to xray 

## 2012-02-13 NOTE — ED Notes (Signed)
Returned from xray

## 2012-06-04 ENCOUNTER — Encounter (HOSPITAL_BASED_OUTPATIENT_CLINIC_OR_DEPARTMENT_OTHER): Payer: Self-pay

## 2012-06-04 ENCOUNTER — Emergency Department (HOSPITAL_BASED_OUTPATIENT_CLINIC_OR_DEPARTMENT_OTHER): Payer: BC Managed Care – PPO

## 2012-06-04 ENCOUNTER — Emergency Department (HOSPITAL_BASED_OUTPATIENT_CLINIC_OR_DEPARTMENT_OTHER)
Admission: EM | Admit: 2012-06-04 | Discharge: 2012-06-04 | Disposition: A | Discharge status: Home / Self Care | Payer: BC Managed Care – PPO | Attending: Emergency Medicine | Admitting: Emergency Medicine

## 2012-06-04 DIAGNOSIS — R112 Nausea with vomiting, unspecified: Secondary | ICD-10-CM | POA: Insufficient documentation

## 2012-06-04 DIAGNOSIS — Z79899 Other long term (current) drug therapy: Secondary | ICD-10-CM | POA: Insufficient documentation

## 2012-06-04 DIAGNOSIS — K5900 Constipation, unspecified: Secondary | ICD-10-CM | POA: Insufficient documentation

## 2012-06-04 DIAGNOSIS — I10 Essential (primary) hypertension: Secondary | ICD-10-CM | POA: Insufficient documentation

## 2012-06-04 DIAGNOSIS — R109 Unspecified abdominal pain: Secondary | ICD-10-CM | POA: Insufficient documentation

## 2012-06-04 LAB — CBC WITH DIFFERENTIAL/PLATELET
Hemoglobin: 12.1 g/dL — ABNORMAL LOW (ref 13.0–17.0)
Lymphs Abs: 2.3 10*3/uL (ref 0.7–4.0)
MCH: 29.3 pg (ref 26.0–34.0)
Monocytes Relative: 11 % (ref 3–12)
Neutro Abs: 4.4 10*3/uL (ref 1.7–7.7)
Neutrophils Relative %: 58 % (ref 43–77)
RBC: 4.13 MIL/uL — ABNORMAL LOW (ref 4.22–5.81)

## 2012-06-04 LAB — URINALYSIS, ROUTINE W REFLEX MICROSCOPIC
Bilirubin Urine: NEGATIVE
Glucose, UA: NEGATIVE mg/dL
Nitrite: NEGATIVE
Specific Gravity, Urine: 1.015 (ref 1.005–1.030)
pH: 6 (ref 5.0–8.0)

## 2012-06-04 LAB — BASIC METABOLIC PANEL
BUN: 12 mg/dL (ref 6–23)
Chloride: 102 mEq/L (ref 96–112)
Glucose, Bld: 95 mg/dL (ref 70–99)
Potassium: 3.5 mEq/L (ref 3.5–5.1)

## 2012-06-04 LAB — URINE MICROSCOPIC-ADD ON

## 2012-06-04 MED ORDER — OXYCODONE-ACETAMINOPHEN 5-325 MG PO TABS: 1.0000 | ORAL_TABLET | Freq: Four times a day (QID) | ORAL | Status: DC | PRN

## 2012-06-04 MED ORDER — AMLODIPINE BESYLATE 10 MG PO TABS: 10.0000 mg | ORAL_TABLET | Freq: Every day | ORAL | Status: DC

## 2012-06-04 MED ORDER — METRONIDAZOLE 500 MG PO TABS: 500.0000 mg | ORAL_TABLET | Freq: Three times a day (TID) | ORAL | Status: DC

## 2012-06-04 MED ORDER — AMLODIPINE BESYLATE 5 MG PO TABS
10.0000 mg | ORAL_TABLET | Freq: Once | ORAL | Status: AC
  Administered 2012-06-04: 10 mg via ORAL
  Filled 2012-06-04: qty 2

## 2012-06-04 MED ORDER — MORPHINE SULFATE 4 MG/ML IJ SOLN
4.0000 mg | Freq: Once | INTRAMUSCULAR | Status: AC
  Administered 2012-06-04: 4 mg via INTRAVENOUS
  Filled 2012-06-04: qty 1

## 2012-06-04 MED ORDER — ONDANSETRON HCL 4 MG/2ML IJ SOLN
4.0000 mg | Freq: Once | INTRAMUSCULAR | Status: AC
  Administered 2012-06-04: 4 mg via INTRAVENOUS
  Filled 2012-06-04: qty 2

## 2012-06-04 MED ORDER — CIPROFLOXACIN HCL 500 MG PO TABS: 500.0000 mg | ORAL_TABLET | Freq: Two times a day (BID) | ORAL | Status: DC

## 2012-06-04 MED ORDER — KETOROLAC TROMETHAMINE 30 MG/ML IJ SOLN
30.0000 mg | Freq: Once | INTRAMUSCULAR | Status: AC
  Administered 2012-06-04: 30 mg via INTRAVENOUS
  Filled 2012-06-04: qty 1

## 2012-06-04 MED ORDER — SODIUM CHLORIDE 0.9 % IV BOLUS (SEPSIS)
500.0000 mL | Freq: Once | INTRAVENOUS | Status: AC
  Administered 2012-06-04: 500 mL via INTRAVENOUS

## 2012-06-04 NOTE — ED Notes (Signed)
Patient reports right flank pain x 4 days. Reports vomiting this evening. Reports dark urine, no hx of kidney stones

## 2012-06-04 NOTE — ED Provider Notes (Signed)
History     CSN: [id number]  Arrival date & time 06/04/12  0454   First MD Initiated Contact with Patient 06/04/12 [phone]      Chief Complaint  Patient presents with  . Flank Pain    (Consider location/radiation/quality/duration/timing/severity/associated sxs/prior treatment) Patient is a 47 y.o. male presenting with flank pain. The history is provided by the patient.  Flank Pain This is a new problem. The current episode started more than 2 days ago. The problem occurs constantly. The problem has not changed since onset.Pertinent negatives include no chest pain and no headaches. Nothing aggravates the symptoms. Nothing relieves the symptoms. He has tried nothing for the symptoms. The treatment provided no relief.  has also vomited x 2 in 4 days and has been constipated  Past Medical History  Diagnosis Date  . Hypertension     History reviewed. No pertinent past surgical history.  No family history on file.  History  Substance Use Topics  . Smoking status: Never Smoker   . Smokeless tobacco: Not on file  . Alcohol Use: No      Review of Systems  Cardiovascular: Negative for chest pain.  Gastrointestinal: Positive for nausea, vomiting and constipation.  Genitourinary: Positive for flank pain. Negative for dysuria and difficulty urinating.  Neurological: Negative for headaches.  All other systems reviewed and are negative.    Allergies  Review of patient's allergies indicates no known allergies.  Home Medications   Current Outpatient Rx  Name  Route  Sig  Dispense  Refill  . amLODipine (NORVASC) 10 MG tablet   Oral   Take 10 mg by mouth daily.         Marland Kitchen triamterene-hydrochlorothiazide (DYAZIDE) 37.5-25 MG per capsule   Oral   Take 1 capsule by mouth every morning.           BP 187/117  Temp(Src) 98.6 F (37 C) (Oral)  SpO2 97%  Physical Exam  Constitutional: He is oriented to person, place, and time. He appears well-developed and well-nourished.  No distress.  HENT:  Head: Normocephalic and atraumatic.  Mouth/Throat: Oropharynx is clear and moist.  Eyes: Conjunctivae are normal. Pupils are equal, round, and reactive to light.  Neck: Normal range of motion. Neck supple.  Cardiovascular: Normal rate, regular rhythm and intact distal pulses.   Pulmonary/Chest: Effort normal and breath sounds normal. He has no wheezes. He has no rales.  Abdominal: Soft. Bowel sounds are increased. There is no tenderness. There is no rebound and no guarding.  Musculoskeletal: Normal range of motion.  Neurological: He is alert and oriented to person, place, and time.  Skin: Skin is warm and dry.    ED Course  Procedures (including critical care time)  Labs Reviewed  URINALYSIS, ROUTINE W REFLEX MICROSCOPIC - Abnormal; Notable for the following:    Leukocytes, UA TRACE (*)    All other components within normal limits  URINE MICROSCOPIC-ADD ON  CBC WITH DIFFERENTIAL  BASIC METABOLIC PANEL   No results found.   No diagnosis found.    MDM  Will cover inflammation with cipro and flagyl.  Follow up with your doctor this week for recheck.  Patient and wife verbalize understanding and agree to follow up        Susie Pousson Smitty Cords, MD 06/04/12 [phone]

## 2012-06-04 NOTE — ED Notes (Signed)
Returned from CT.

## 2013-03-31 ENCOUNTER — Emergency Department (HOSPITAL_BASED_OUTPATIENT_CLINIC_OR_DEPARTMENT_OTHER): Payer: 59

## 2013-03-31 ENCOUNTER — Inpatient Hospital Stay (HOSPITAL_BASED_OUTPATIENT_CLINIC_OR_DEPARTMENT_OTHER)
Admission: EM | Admit: 2013-03-31 | Discharge: 2013-04-04 | DRG: 064 | Disposition: A | Discharge status: Home Health | Payer: 59 | Attending: Internal Medicine | Admitting: Internal Medicine

## 2013-03-31 ENCOUNTER — Encounter (HOSPITAL_BASED_OUTPATIENT_CLINIC_OR_DEPARTMENT_OTHER): Payer: Self-pay | Admitting: Emergency Medicine

## 2013-03-31 DIAGNOSIS — N183 Chronic kidney disease, stage 3 unspecified: Secondary | ICD-10-CM

## 2013-03-31 DIAGNOSIS — Z006 Encounter for examination for normal comparison and control in clinical research program: Secondary | ICD-10-CM

## 2013-03-31 DIAGNOSIS — R079 Chest pain, unspecified: Secondary | ICD-10-CM

## 2013-03-31 DIAGNOSIS — I129 Hypertensive chronic kidney disease with stage 1 through stage 4 chronic kidney disease, or unspecified chronic kidney disease: Secondary | ICD-10-CM | POA: Diagnosis present

## 2013-03-31 DIAGNOSIS — Z91199 Patient's noncompliance with other medical treatment and regimen due to unspecified reason: Secondary | ICD-10-CM

## 2013-03-31 DIAGNOSIS — R51 Headache: Secondary | ICD-10-CM

## 2013-03-31 DIAGNOSIS — I639 Cerebral infarction, unspecified: Secondary | ICD-10-CM

## 2013-03-31 DIAGNOSIS — R531 Weakness: Secondary | ICD-10-CM | POA: Diagnosis present

## 2013-03-31 DIAGNOSIS — I6340 Cerebral infarction due to embolism of unspecified cerebral artery: Principal | ICD-10-CM | POA: Diagnosis present

## 2013-03-31 DIAGNOSIS — R209 Unspecified disturbances of skin sensation: Secondary | ICD-10-CM | POA: Diagnosis present

## 2013-03-31 DIAGNOSIS — R519 Headache, unspecified: Secondary | ICD-10-CM

## 2013-03-31 DIAGNOSIS — Z9119 Patient's noncompliance with other medical treatment and regimen: Secondary | ICD-10-CM

## 2013-03-31 DIAGNOSIS — I10 Essential (primary) hypertension: Secondary | ICD-10-CM

## 2013-03-31 DIAGNOSIS — G9340 Encephalopathy, unspecified: Secondary | ICD-10-CM

## 2013-03-31 DIAGNOSIS — Z8249 Family history of ischemic heart disease and other diseases of the circulatory system: Secondary | ICD-10-CM

## 2013-03-31 DIAGNOSIS — G9349 Other encephalopathy: Secondary | ICD-10-CM | POA: Diagnosis present

## 2013-03-31 DIAGNOSIS — I160 Hypertensive urgency: Secondary | ICD-10-CM

## 2013-03-31 DIAGNOSIS — R262 Difficulty in walking, not elsewhere classified: Secondary | ICD-10-CM | POA: Diagnosis present

## 2013-03-31 DIAGNOSIS — Z79899 Other long term (current) drug therapy: Secondary | ICD-10-CM

## 2013-03-31 DIAGNOSIS — G8190 Hemiplegia, unspecified affecting unspecified side: Secondary | ICD-10-CM | POA: Diagnosis present

## 2013-03-31 DIAGNOSIS — R0789 Other chest pain: Secondary | ICD-10-CM

## 2013-03-31 DIAGNOSIS — E785 Hyperlipidemia, unspecified: Secondary | ICD-10-CM | POA: Diagnosis present

## 2013-03-31 DIAGNOSIS — I2510 Atherosclerotic heart disease of native coronary artery without angina pectoris: Secondary | ICD-10-CM | POA: Diagnosis present

## 2013-03-31 DIAGNOSIS — H53149 Visual discomfort, unspecified: Secondary | ICD-10-CM | POA: Diagnosis present

## 2013-03-31 LAB — COMPREHENSIVE METABOLIC PANEL
ALT: 43 U/L (ref 0–53)
AST: 25 U/L (ref 0–37)
Albumin: 4 g/dL (ref 3.5–5.2)
Alkaline Phosphatase: 26 U/L — ABNORMAL LOW (ref 39–117)
BUN: 18 mg/dL (ref 6–23)
CALCIUM: 9.6 mg/dL (ref 8.4–10.5)
CO2: 29 meq/L (ref 19–32)
CREATININE: 1.7 mg/dL — AB (ref 0.50–1.35)
Chloride: 98 mEq/L (ref 96–112)
GFR, EST AFRICAN AMERICAN: 53 mL/min — AB (ref >90)
GFR, EST NON AFRICAN AMERICAN: 46 mL/min — AB (ref >90)
GLUCOSE: 110 mg/dL — AB (ref 70–99)
Potassium: 3.4 mEq/L — ABNORMAL LOW (ref 3.7–5.3)
SODIUM: 140 meq/L (ref 137–147)
TOTAL PROTEIN: 7.6 g/dL (ref 6.0–8.3)
Total Bilirubin: 0.5 mg/dL (ref 0.3–1.2)

## 2013-03-31 LAB — CBC WITH DIFFERENTIAL/PLATELET
Basophils Absolute: 0 K/uL (ref 0.0–0.1)
Basophils Relative: 1 % (ref 0–1)
Eosinophils Absolute: 0.1 K/uL (ref 0.0–0.7)
Eosinophils Relative: 1 % (ref 0–5)
HCT: 36.4 % — ABNORMAL LOW (ref 39.0–52.0)
Hemoglobin: 12.3 g/dL — ABNORMAL LOW (ref 13.0–17.0)
Lymphocytes Relative: 34 % (ref 12–46)
Lymphs Abs: 2.7 K/uL (ref 0.7–4.0)
MCH: 29.5 pg (ref 26.0–34.0)
MCHC: 33.8 g/dL (ref 30.0–36.0)
MCV: 87.3 fL (ref 78.0–100.0)
Monocytes Absolute: 0.8 K/uL (ref 0.1–1.0)
Monocytes Relative: 10 % (ref 3–12)
Neutro Abs: 4.5 K/uL (ref 1.7–7.7)
Neutrophils Relative %: 55 % (ref 43–77)
Platelets: 192 K/uL (ref 150–400)
RBC: 4.17 MIL/uL — ABNORMAL LOW (ref 4.22–5.81)
RDW: 12.8 % (ref 11.5–15.5)
WBC: 8.1 K/uL (ref 4.0–10.5)

## 2013-03-31 LAB — TROPONIN I: Troponin I: <0.3 ng/mL (ref <0.30)

## 2013-03-31 LAB — PROTIME-INR
INR: 1 (ref 0.00–1.49)
Prothrombin Time: 13 seconds (ref 11.6–15.2)

## 2013-03-31 LAB — APTT: APTT: 31 s (ref 24–37)

## 2013-03-31 MED ORDER — LABETALOL HCL 5 MG/ML IV SOLN
20.0000 mg | Freq: Once | INTRAVENOUS | Status: AC
  Administered 2013-03-31: 20 mg via INTRAVENOUS
  Filled 2013-03-31: qty 4

## 2013-03-31 NOTE — ED Provider Notes (Signed)
CSN: [id number]     Arrival date & time 03/31/13  2225 History  This chart was scribed for Loren Racer, MD by Blanchard Kelch, ED Scribe. The patient was seen in room MH02/MH02. Patient's care was started at 11:26 PM.    Chief Complaint  Patient presents with  . Chest Pain     Patient is a 48 y.o. male presenting with chest pain and headaches. The history is provided by the patient. No language interpreter was used.  Chest Pain Associated symptoms: headache, nausea and weakness   Associated symptoms: no abdominal pain, no back pain, no dizziness, no fever, no numbness, no palpitations, no shortness of breath and not vomiting   Headache Pain location:  Frontal (left frontal) Onset quality:  Sudden Duration:  2 hours Timing:  Constant Chronicity:  New Relieved by:  Nothing Worsened by:  Neck movement Associated symptoms: nausea and photophobia   Associated symptoms: no abdominal pain, no back pain, no diarrhea, no dizziness, no pain, no fever, no myalgias, no neck pain, no neck stiffness, no numbness, no sore throat and no vomiting     HPI Comments: Serapio Edelson is a 48 y.o. male who presents to the Emergency Department complaining of sudden-onset, unchanged, constant left sided headache that began about two hours ago. He states he was driving when it came on. He reports feeling generalized fatigue and drowsiness since the headache came on. The pain is worsened with movement of his head. He denies blurred vision. He denies taking his HTN medication regularly. He states his last dose was two days ago on 3/19. He is also complaining of constant, left sided chest pain that began prior to the headache coming on. He describes the pain as sharp. He also states he has nausea concurrently with the other symptoms that has resolved. He states that he has been mildly uncoordinated tonight. He denies recent medication intake or alcohol use prior to the drowsiness starting.  He denies abdominal pain,     Past Medical History  Diagnosis Date  . Hypertension    History reviewed. No pertinent past surgical history. History reviewed. No pertinent family history. History  Substance Use Topics  . Smoking status: Never Smoker   . Smokeless tobacco: Not on file  . Alcohol Use: No    Review of Systems  Constitutional: Negative for fever and chills.  HENT: Negative for dental problem and sore throat.   Eyes: Positive for photophobia. Negative for pain and visual disturbance.  Respiratory: Negative for chest tightness and shortness of breath.   Cardiovascular: Positive for chest pain. Negative for palpitations and leg swelling.  Gastrointestinal: Positive for nausea. Negative for vomiting, abdominal pain and diarrhea.  Musculoskeletal: Negative for back pain, myalgias, neck pain and neck stiffness.  Skin: Negative for rash and wound.  Neurological: Positive for weakness and headaches. Negative for dizziness, syncope, light-headedness and numbness.  All other systems reviewed and are negative.      Allergies  Review of patient's allergies indicates no known allergies.  Home Medications   Current Outpatient Rx  Name  Route  Sig  Dispense  Refill  . amLODipine (NORVASC) 10 MG tablet   Oral   Take 10 mg by mouth daily.         Marland Kitchen amLODipine (NORVASC) 10 MG tablet   Oral   Take 1 tablet (10 mg total) by mouth daily.   5 tablet   0   . ciprofloxacin (CIPRO) 500 MG tablet   Oral  Take 1 tablet (500 mg total) by mouth 2 (two) times daily. One po bid x 7 days   14 tablet   0   . metroNIDAZOLE (FLAGYL) 500 MG tablet   Oral   Take 1 tablet (500 mg total) by mouth 3 (three) times daily. One po bid x 7 days   21 tablet   0   . oxyCODONE-acetaminophen (PERCOCET) 5-325 MG per tablet   Oral   Take 1 tablet by mouth every 6 (six) hours as needed for pain.   10 tablet   0   . triamterene-hydrochlorothiazide (DYAZIDE) 37.5-25 MG per capsule   Oral   Take 1 capsule by mouth  every morning.          Triage Vitals: BP 186/115  Pulse 79  Temp(Src) 98 F (36.7 C) (Oral)  Resp 18  SpO2 99%  Physical Exam  Nursing note and vitals reviewed. Constitutional: He is oriented to person, place, and time. He appears well-developed and well-nourished. No distress.  HENT:  Head: Normocephalic and atraumatic.  Mouth/Throat: Oropharynx is clear and moist. No oropharyngeal exudate.  Eyes: EOM are normal. Pupils are equal, round, and reactive to light.  Neck: Normal range of motion. Neck supple.  No nuchal rigidity.  Cardiovascular: Normal rate and regular rhythm.  Exam reveals no gallop and no friction rub.   No murmur heard. Pulmonary/Chest: Effort normal and breath sounds normal. No respiratory distress. He has no wheezes. He has no rales. He exhibits tenderness (chest tenderness is completely reproduced with palpation over the left pectoralis. There is no crepitance or deformity.).  Abdominal: Soft. Bowel sounds are normal. He exhibits no distension and no mass. There is no tenderness. There is no rebound and no guarding.  Musculoskeletal: Normal range of motion. He exhibits no edema and no tenderness.  Patient has no calf swelling or tenderness.  Neurological: He is alert and oriented to person, place, and time.  Patient appears drowsy but is easily aroused. He has delayed response. He has no slurred speech. His finger to nose testing with mild lack of coordination bilaterally. Cranial nerves II through XII grossly intact. 4/5 motor in right upper extremity and right lower extremity. Drifting is apparent. 5/5 motor in left upper and lower extremities. Sensation is grossly intact to light touch.  Skin: Skin is warm and dry. No rash noted. No erythema.  Psychiatric: He has a normal mood and affect. His behavior is normal.    ED Course  Procedures (including critical care time)  DIAGNOSTIC STUDIES: Oxygen Saturation is 99% on room air, normal by my interpretation.     COORDINATION OF CARE: 11:30 PM -Will activate code stroke and [facility]. Patient verbalizes understanding and agrees with treatment plan.    Labs Review Labs Reviewed  CBC WITH DIFFERENTIAL - Abnormal; Notable for the following:    RBC 4.17 (*)    Hemoglobin 12.3 (*)    HCT 36.4 (*)    All other components within normal limits  COMPREHENSIVE METABOLIC PANEL  TROPONIN I   Imaging Review No results found.   EKG Interpretation None      Date: 04/01/2013  Rate: 77  Rhythm: normal sinus rhythm  QRS Axis: normal  Intervals: normal  ST/T Wave abnormalities: normal  Conduction Disutrbances:none  Narrative Interpretation:   Old EKG Reviewed: none available  CRITICAL CARE Performed by: Ranae PalmsYELVERTON, Tye Vigo Total critical care time: 20 min Critical care time was exclusive of separately billable procedures and treating other patients.  Critical care was necessary to treat or prevent imminent or life-threatening deterioration. Critical care was time spent personally by me on the following activities: development of treatment plan with patient and/or surrogate as well as nursing, discussions with consultants, evaluation of patient's response to treatment, examination of patient, obtaining history from patient or surrogate, ordering and performing treatments and interventions, ordering and review of laboratory studies, ordering and review of radiographic studies, pulse oximetry and re-evaluation of patient's condition.   MDM   Final diagnoses:  None   I personally performed the services described in this documentation, which was scribed in my presence. The recorded information has been reviewed and is accurate.  Given the new onset of symptoms including weakness code stroke was called. I discussed the case with Dr. Roseanne Reno who stated there was not enough evidence currently to give TPA. He will see the patient in the emergency department at [facility]. I discussed with Dr.  Meredith Mody or his excepted the patient in transfer. I have reviewed the CT of the patient's head and cannot see any acute bleeding. Have given a dose of labetalol to gradually lower the patient's blood pressure.  Patient is being transferred to the [facility] emergency Department via care Link.   Loren Racer, MD 04/01/13 [phone]

## 2013-03-31 NOTE — ED Notes (Signed)
Pt reports intermittant chest pain x 1 month with episode mid sternal CP this evening starting approx. 1 hr ago

## 2013-03-31 NOTE — ED Notes (Signed)
Pt noted to be excessively sleepy, snoring with forced respirations then light, periodic breaths.  Wife states patient had a sleep study approx 2 years ago.  Pt reports his head 'feels heavy', particularly on the left side.  No facial drooping noted.  Dr. Ranae PalmsYelverton notified.

## 2013-04-01 ENCOUNTER — Observation Stay (HOSPITAL_COMMUNITY): Payer: 59

## 2013-04-01 ENCOUNTER — Emergency Department (HOSPITAL_COMMUNITY): Payer: 59

## 2013-04-01 ENCOUNTER — Encounter (HOSPITAL_COMMUNITY): Payer: Self-pay | Admitting: *Deleted

## 2013-04-01 DIAGNOSIS — N183 Chronic kidney disease, stage 3 unspecified: Secondary | ICD-10-CM

## 2013-04-01 DIAGNOSIS — R51 Headache: Secondary | ICD-10-CM | POA: Diagnosis present

## 2013-04-01 DIAGNOSIS — R079 Chest pain, unspecified: Secondary | ICD-10-CM | POA: Diagnosis present

## 2013-04-01 DIAGNOSIS — R531 Weakness: Secondary | ICD-10-CM | POA: Diagnosis present

## 2013-04-01 DIAGNOSIS — I160 Hypertensive urgency: Secondary | ICD-10-CM | POA: Diagnosis present

## 2013-04-01 DIAGNOSIS — R519 Headache, unspecified: Secondary | ICD-10-CM | POA: Diagnosis present

## 2013-04-01 DIAGNOSIS — G9340 Encephalopathy, unspecified: Secondary | ICD-10-CM | POA: Diagnosis present

## 2013-04-01 DIAGNOSIS — I10 Essential (primary) hypertension: Secondary | ICD-10-CM

## 2013-04-01 LAB — BASIC METABOLIC PANEL
BUN: 16 mg/dL (ref 6–23)
CO2: 26 mEq/L (ref 19–32)
Calcium: 9 mg/dL (ref 8.4–10.5)
Chloride: 101 mEq/L (ref 96–112)
Creatinine, Ser: 1.49 mg/dL — ABNORMAL HIGH (ref 0.50–1.35)
GFR calc Af Amer: 62 mL/min — ABNORMAL LOW (ref >90)
GFR, EST NON AFRICAN AMERICAN: 54 mL/min — AB (ref >90)
Glucose, Bld: 99 mg/dL (ref 70–99)
POTASSIUM: 4 meq/L (ref 3.7–5.3)
Sodium: 140 mEq/L (ref 137–147)

## 2013-04-01 LAB — URINALYSIS, ROUTINE W REFLEX MICROSCOPIC
BILIRUBIN URINE: NEGATIVE
GLUCOSE, UA: NEGATIVE mg/dL
HGB URINE DIPSTICK: NEGATIVE
Ketones, ur: NEGATIVE mg/dL
Nitrite: NEGATIVE
PROTEIN: NEGATIVE mg/dL
Specific Gravity, Urine: 1.012 (ref 1.005–1.030)
Urobilinogen, UA: 0.2 mg/dL (ref 0.0–1.0)
pH: 6.5 (ref 5.0–8.0)

## 2013-04-01 LAB — URINE MICROSCOPIC-ADD ON

## 2013-04-01 LAB — RAPID URINE DRUG SCREEN, HOSP PERFORMED
Amphetamines: NOT DETECTED
BENZODIAZEPINES: NOT DETECTED
Barbiturates: NOT DETECTED
COCAINE: NOT DETECTED
Opiates: NOT DETECTED
Tetrahydrocannabinol: NOT DETECTED

## 2013-04-01 LAB — TROPONIN I
Troponin I: <0.3 ng/mL (ref <0.30)
Troponin I: <0.3 ng/mL (ref <0.30)

## 2013-04-01 LAB — CBC
HCT: 34.5 % — ABNORMAL LOW (ref 39.0–52.0)
Hemoglobin: 11.8 g/dL — ABNORMAL LOW (ref 13.0–17.0)
MCH: 29.3 pg (ref 26.0–34.0)
MCHC: 34.2 g/dL (ref 30.0–36.0)
MCV: 85.6 fL (ref 78.0–100.0)
PLATELETS: 175 10*3/uL (ref 150–400)
RBC: 4.03 MIL/uL — ABNORMAL LOW (ref 4.22–5.81)
RDW: 13.5 % (ref 11.5–15.5)
WBC: 7 10*3/uL (ref 4.0–10.5)

## 2013-04-01 MED ORDER — HYDRALAZINE HCL 20 MG/ML IJ SOLN
10.0000 mg | Freq: Four times a day (QID) | INTRAMUSCULAR | Status: DC | PRN
  Administered 2013-04-02: 10 mg via INTRAVENOUS
  Filled 2013-04-01: qty 1

## 2013-04-01 MED ORDER — ACETAMINOPHEN 325 MG PO TABS
650.0000 mg | ORAL_TABLET | Freq: Four times a day (QID) | ORAL | Status: DC | PRN
  Administered 2013-04-02 (×2): 650 mg via ORAL
  Filled 2013-04-01 (×2): qty 2

## 2013-04-01 MED ORDER — ASPIRIN 325 MG PO TABS
325.0000 mg | ORAL_TABLET | Freq: Every day | ORAL | Status: DC
  Administered 2013-04-01: 325 mg via ORAL
  Filled 2013-04-01: qty 1

## 2013-04-01 MED ORDER — BLOOD PRESSURE CONTROL BOOK
Freq: Once | Status: AC
  Administered 2013-04-01: 18:00:00
  Filled 2013-04-01: qty 1

## 2013-04-01 MED ORDER — ENOXAPARIN SODIUM 40 MG/0.4ML ~~LOC~~ SOLN
40.0000 mg | SUBCUTANEOUS | Status: DC
  Administered 2013-04-01 – 2013-04-03 (×3): 40 mg via SUBCUTANEOUS
  Filled 2013-04-01 (×4): qty 0.4

## 2013-04-01 MED ORDER — AMLODIPINE BESYLATE 10 MG PO TABS
10.0000 mg | ORAL_TABLET | Freq: Every day | ORAL | Status: DC
  Administered 2013-04-01 – 2013-04-04 (×4): 10 mg via ORAL
  Filled 2013-04-01 (×4): qty 1

## 2013-04-01 MED ORDER — ONDANSETRON HCL 4 MG/2ML IJ SOLN: 4.0000 mg | Freq: Four times a day (QID) | INTRAMUSCULAR | Status: DC | PRN

## 2013-04-01 MED ORDER — STUDY - INVESTIGATIONAL DRUG SIMPLE RECORD
180.0000 mg | Freq: Once | Status: AC
  Administered 2013-04-01: 180 mg via ORAL
  Filled 2013-04-01: qty 180

## 2013-04-01 MED ORDER — SODIUM CHLORIDE 0.9 % IV SOLN
INTRAVENOUS | Status: DC
  Administered 2013-04-01: 08:00:00 via INTRAVENOUS

## 2013-04-01 MED ORDER — STUDY - INVESTIGATIONAL DRUG SIMPLE RECORD
300.0000 mg | Freq: Once | Status: AC
  Administered 2013-04-01: 300 mg via ORAL
  Filled 2013-04-01: qty 300

## 2013-04-01 MED ORDER — STUDY - INVESTIGATIONAL DRUG SIMPLE RECORD
90.0000 mg | Freq: Two times a day (BID) | Status: DC
  Administered 2013-04-02 – 2013-04-04 (×5): 90 mg via ORAL
  Filled 2013-04-01 (×5): qty 90

## 2013-04-01 MED ORDER — SENNOSIDES-DOCUSATE SODIUM 8.6-50 MG PO TABS
1.0000 | ORAL_TABLET | Freq: Every evening | ORAL | Status: DC | PRN
  Filled 2013-04-01: qty 1

## 2013-04-01 MED ORDER — ASPIRIN 300 MG RE SUPP
300.0000 mg | Freq: Every day | RECTAL | Status: DC
  Filled 2013-04-01: qty 1

## 2013-04-01 MED ORDER — STUDY - INVESTIGATIONAL DRUG SIMPLE RECORD
100.0000 mg | Freq: Every day | Status: DC
  Administered 2013-04-02 – 2013-04-04 (×3): 100 mg via ORAL
  Filled 2013-04-01 (×3): qty 100

## 2013-04-01 MED ORDER — EXERCISE FOR HEART AND HEALTH BOOK
Freq: Once | Status: AC
  Administered 2013-04-01: 18:00:00
  Filled 2013-04-01 (×2): qty 1

## 2013-04-01 MED ORDER — STROKE: EARLY STAGES OF RECOVERY BOOK
Freq: Once | Status: AC
  Administered 2013-04-01: 18:00:00
  Filled 2013-04-01: qty 1

## 2013-04-01 MED ORDER — IOHEXOL 350 MG/ML SOLN
125.0000 mL | Freq: Once | INTRAVENOUS | Status: AC | PRN
  Administered 2013-04-01: 125 mL via INTRAVENOUS

## 2013-04-01 MED ORDER — POTASSIUM CHLORIDE CRYS ER 20 MEQ PO TBCR
40.0000 meq | EXTENDED_RELEASE_TABLET | Freq: Once | ORAL | Status: AC
  Administered 2013-04-01: 40 meq via ORAL
  Filled 2013-04-01: qty 2

## 2013-04-01 MED ORDER — HYDROCODONE-ACETAMINOPHEN 5-325 MG PO TABS
1.0000 | ORAL_TABLET | ORAL | Status: DC | PRN
  Administered 2013-04-01 (×2): 1 via ORAL
  Filled 2013-04-01 (×2): qty 1

## 2013-04-01 MED ORDER — ONDANSETRON HCL 4 MG/2ML IJ SOLN: 4.0000 mg | Freq: Three times a day (TID) | INTRAMUSCULAR | Status: DC | PRN

## 2013-04-01 NOTE — H&P (Signed)
[facility] History and Physical  Damarie Schoolfield OZH:[id number] DOB: 19-Jun-1965 DOA: 03/31/2013  Referring physician: Dr. Hyacinth Meeker, ER physician PCP: No primary provider on file.   Chief Complaint: Altered mental status  HPI: Nycholas Rayner is a 48 y.o. male who initially presented to [facility] with complaints of chest pain. His wife reports that the patient was having chest pain that began around 8 PM last evening. This began at occur while he was driving. He reports feeling generally unwell for the entire day. She may have noticed some slurring of speech, but the patient denies this. When the patient was walking, his wife noticed that he was leaning towards the right side. 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 He also reports some left facial numbness. Code stroke was called and patient was transferred to [facility] for further evaluation. Upon arrival, he was seen by neurology to further assess. It was felt the patient was not a candidate for TPA due to low NIH score and code stroke was canceled. The hospitalists have been asked to admit the patient for further workup.   Review of Systems:  Limited due to patient's mental status. Pertinent positives as per history of present illness.   Past Medical History  Diagnosis Date  . Hypertension    History reviewed. No pertinent past surgical history. Social History:  reports that he has never smoked. He does not have any smokeless tobacco history on file. He reports that he does not drink alcohol or use illicit drugs.  No Known Allergies  Family history: Positive for heart disease and hypertension   Prior to Admission medications   Medication Sig Start Date End Date Taking? Authorizing Provider  acetaminophen (TYLENOL) 500 MG tablet Take 1,500 mg by mouth every 6 (six) hours as needed for moderate pain or headache.   Yes Historical Provider, MD  amLODipine  (NORVASC) 10 MG tablet Take 10 mg by mouth daily.   Yes Historical Provider, MD  triamterene-hydrochlorothiazide (DYAZIDE) 37.5-25 MG per capsule Take 1 capsule by mouth daily.    Yes Historical Provider, MD   Physical Exam: Filed Vitals:   04/01/13 0500  BP: 145/95  Pulse: 78  Temp:   Resp: 12    BP 145/95  Pulse 78  Temp(Src) 98.6 F (37 C) (Oral)  Resp 12  SpO2 98%  General:  Appears calm and comfortable, somnolent Eyes: PERRL, normal lids, irises & conjunctiva ENT: grossly normal hearing, lips & tongue Neck: no LAD, masses or thyromegaly Cardiovascular: RRR, no m/r/g. Trace lower extremity edema. Telemetry: SR, no arrhythmias  Respiratory: CTA bilaterally, no w/r/r. Normal respiratory effort. Abdomen: soft, nondistended, tender in the periumbilical/hypogastric area, bowel sounds positive Skin: no rash or induration seen on limited exam Musculoskeletal: grossly normal tone BUE/BLE Psychiatric: grossly normal mood and affect, speech fluent and appropriate Neurologic: Strength is 5 out of 5 in the upper extremities bilaterally. Appears to be 2-3/5 in the right lower extremity, 3/5 in the left lower extremity. Unclear if it is a true weakness versus poor effort. No facial asymmetry. Cranial nerves appear to be grossly intact..          Labs on Admission:  Basic Metabolic Panel:  Recent Labs Lab 03/31/13 2300  NA 140  K 3.4*  CL 98  CO2 29  GLUCOSE 110*  BUN 18  CREATININE 1.70*  CALCIUM 9.6   Liver Function Tests:  Recent Labs Lab 03/31/13 2300  AST 25  ALT 43  ALKPHOS 26*  BILITOT 0.5  PROT 7.6  ALBUMIN 4.0   No results found for this basename: LIPASE, AMYLASE,  in the last 168 hours No results found for this basename: AMMONIA,  in the last 168 hours CBC:  Recent Labs Lab 03/31/13 2300  WBC 8.1  NEUTROABS 4.5  HGB 12.3*  HCT 36.4*  MCV 87.3  PLT 192   Cardiac Enzymes:  Recent Labs Lab 03/31/13 2300  TROPONINI <0.30    BNP (last 3  results) No results found for this basename: PROBNP,  in the last 8760 hours CBG: No results found for this basename: GLUCAP,  in the last 168 hours  Radiological Exams on Admission: Ct Angio Head W/cm &/or Wo Cm  04/01/2013   EXAM: CT ANGIOGRAPHY HEAD AND NECK  TECHNIQUE: Multidetector CT imaging of the head and neck was performed using the standard protocol during bolus administration of intravenous contrast. Multiplanar CT image reconstructions and MIPs were obtained to evaluate the vascular anatomy. Carotid stenosis measurements (when applicable) are obtained utilizing NASCET criteria, using the distal internal carotid diameter as the denominator.  CONTRAST:  125mL OMNIPAQUE IOHEXOL 350 MG/ML SOLN  COMPARISON:  None.  FINDINGS: CTA HEAD FINDINGS  Anterior circulation: Normal appearance of the cervical internal carotid arteries, petrous, cavernous and supra clinoid internal carotid arteries. A few scattered atherosclerotic plaques noted within the cavernous segments of the internal carotid arteries bilaterally. Widely patent anterior communicating artery. The right A1 segment is hypoplastic. Normal appearance of the anterior and middle cerebral arteries.  Posterior circulation: Left vertebral artery is dominant, with normal appearance of the vertebral arteries, vertebrobasilar junction and basilar artery, as well as main branch vessels. Basilar artery is tortuous. Small bilateral posterior communicating arteries are present. Normal appearance of posterior cerebral arteries.  No large vessel occlusion, hemodynamically significant stenosis, dissection, luminal irregularity, contrast extravasation or aneurysm within the anterior nor posterior circulation.  Review of the MIP images confirms the above findings.  CTA NECK FINDINGS  The visualized intrathoracic aorta is of normal caliber. Incidental note made of a bovine arch. Visualized subclavian arteries are widely patent bilaterally. No high-grade stenosis at  the origin of the great vessels.  The common carotid arteries are well opacified bilaterally without evidence of dissection or high-grade flow-limiting stenosis. Carotid bifurcations are normal. The internal carotid arteries are symmetric in well opacified bilaterally. No high-grade stenosis or carotid artery dissection.  Visualized external carotid arteries and their branches are within normal limits.  Both the vertebral arteries arise from the subclavian arteries. The left vertebral artery is dominant. No high-grade flow-limiting stenosis, dissection, or other abnormality identified within the vertebral arteries.  Review of the MIP images confirms the above findings.  Visualized soft tissues of the neck are within normal limits. Visualized lungs are clear.  IMPRESSION: CTA HEAD:  1. Normal CTA of the head without evidence of high-grade flow-limiting stenosis, proximal branch occlusion, or intracranial aneurysm.  CTA NECK:  1. Normal CTA of the neck without evidence of high-grade flow-limiting stenosis, dissection, or other acute abnormality. 2. Dominant left vertebral artery.   Electronically Signed   By: Rise MuBenjamin  McClintock M.D.   On: 04/01/2013 02:56   Dg Chest 2 View  03/31/2013   CLINICAL DATA:  Intermittent abdominal pain  EXAM: CHEST  2 VIEW  COMPARISON:  CT ABD/PELV WO CM dated 06/04/2012; DG CHEST 1V PORT dated 03/02/2011  FINDINGS: Mildly enlarged cardiac silhouette. No effusion, infiltrate, pneumothorax. No acute osseous abnormality.  IMPRESSION: No acute cardiopulmonary process.   Electronically Signed   By: Genevive Bi M.D.   On: 03/31/2013 23:57   Ct Head Wo Contrast  04/01/2013   CLINICAL DATA:  Chest pain, hypertension.  EXAM: CT HEAD WITHOUT CONTRAST  TECHNIQUE: Contiguous axial images were obtained from the base of the skull through the vertex without intravenous contrast.  COMPARISON:  CT HEAD W/O CM dated 11/25/2008  FINDINGS: No acute intracranial hemorrhage. No focal mass lesion. No  CT evidence of acute infarction. No midline shift or mass effect. No hydrocephalus. Basilar cisterns are patent. Paranasal sinuses and mastoid air cells are clear.  IMPRESSION: Normal head CT.   Electronically Signed   By: Genevive Bi M.D.   On: 04/01/2013 00:35   Ct Angio Neck W/cm &/or Wo/cm  04/01/2013   EXAM: CT ANGIOGRAPHY HEAD AND NECK  TECHNIQUE: Multidetector CT imaging of the head and neck was performed using the standard protocol during bolus administration of intravenous contrast. Multiplanar CT image reconstructions and MIPs were obtained to evaluate the vascular anatomy. Carotid stenosis measurements (when applicable) are obtained utilizing NASCET criteria, using the distal internal carotid diameter as the denominator.  CONTRAST:  OMNIPAQUE IOHEXOL 350 MG/ML SOLN  COMPARISON:  None.  FINDINGS: CTA HEAD FINDINGS  Anterior circulation: Normal appearance of the cervical internal carotid arteries, petrous, cavernous and supra clinoid internal carotid arteries. A few scattered atherosclerotic plaques noted within the cavernous segments of the internal carotid arteries bilaterally. Widely patent anterior communicating artery. The right A1 segment is hypoplastic. Normal appearance of the anterior and middle cerebral arteries.  Posterior circulation: Left vertebral artery is dominant, with normal appearance of the vertebral arteries, vertebrobasilar junction and basilar artery, as well as main branch vessels. Basilar artery is tortuous. Small bilateral posterior communicating arteries are present. Normal appearance of posterior cerebral arteries.  No large vessel occlusion, hemodynamically significant stenosis, dissection, luminal irregularity, contrast extravasation or aneurysm within the anterior nor posterior circulation.  Review of the MIP images confirms the above findings.  CTA NECK FINDINGS  The visualized intrathoracic aorta is of normal caliber. Incidental note made of a bovine arch.  Visualized subclavian arteries are widely patent bilaterally. No high-grade stenosis at the origin of the great vessels.  The common carotid arteries are well opacified bilaterally without evidence of dissection or high-grade flow-limiting stenosis. Carotid bifurcations are normal. The internal carotid arteries are symmetric in well opacified bilaterally. No high-grade stenosis or carotid artery dissection.  Visualized external carotid arteries and their branches are within normal limits.  Both the vertebral arteries arise from the subclavian arteries. The left vertebral artery is dominant. No high-grade flow-limiting stenosis, dissection, or other abnormality identified within the vertebral arteries.  Review of the MIP images confirms the above findings.  Visualized soft tissues of the neck are within normal limits. Visualized lungs are clear.  IMPRESSION: CTA HEAD:  1. Normal CTA of the head without evidence of high-grade flow-limiting stenosis, proximal branch occlusion, or intracranial aneurysm.  CTA NECK:  1. Normal CTA of the neck without evidence of high-grade flow-limiting stenosis, dissection, or other acute abnormality. 2. Dominant left vertebral artery.   Electronically Signed   By: Rise Mu M.D.   On: 04/01/2013 02:56   Ct Angio Chest Aorta W/cm &/or Wo/cm  04/01/2013   CLINICAL DATA:  Left-sided chest pain. Left-sided weakness and numbness. Lethargy.  EXAM: CT ANGIOGRAPHY CHEST WITH CONTRAST  TECHNIQUE: Multidetector CT imaging of the chest was performed using the standard protocol  during bolus administration of intravenous contrast. Multiplanar CT image reconstructions and MIPs were obtained to evaluate the vascular anatomy.  CONTRAST:  OMNIPAQUE IOHEXOL 350 MG/ML IV.  COMPARISON:  DG CHEST 2 VIEW dated 03/31/2013; DG CHEST 1V PORT dated 03/02/2011; DG CHEST 2 VIEW dated 11/25/2008  FINDINGS: Unenhanced images demonstrate no evidence of mural hematoma in the thoracic her of abdominal  aorta. Calcified plaque is present in the distal thoracic aorta.  Enhanced images demonstrate no evidence of thoracic or upper abdominal aortic aneurysm or dissection. Mild atherosclerosis involving the thoracic aorta with predominantly noncalcified plaque. Bovine aortic arch anatomy is present (left common carotid artery arises from the innominate artery). No visible atherosclerosis involving the proximal great vessels.  Mild LAD coronary atherosclerosis. Cardiomegaly with left ventricular hypertrophy. No pericardial effusion. Central pulmonary arteries patent.  No significant mediastinal, hilar, or axillary lymphadenopathy. Visualized thyroid gland unremarkable.  Expected dependent atelectasis posteriorly in the lower lobes. Pulmonary parenchyma clear without localized airspace consolidation, interstitial disease, or parenchymal nodules or masses. Central airways patent without significant bronchial wall thickening.  Visualized upper abdomen unremarkable for the early arterial phase of enhancement. Bone window images unremarkable.  Review of the MIP images confirms the above findings.  IMPRESSION: 1. No evidence of thoracic aortic aneurysm or dissection. Mild atherosclerosis involving the mid and distal thoracic aorta with predominantly noncalcified plaque. 2. Bovine aortic arch anatomy. No visible atherosclerosis involving the proximal great vessels. 3. Cardiomegaly with left ventricular hypertrophy and mild LAD coronary atherosclerosis. 4.  No acute cardiopulmonary disease.   Electronically Signed   By: Hulan Saas M.D.   On: 04/01/2013 02:57    EKG: Not available for my review. Per ER note, no acute findings  Assessment/Plan Active Problems:   Chest pain   Encephalopathy   Hypertensive urgency   Right sided weakness   CKD (chronic kidney disease) stage 3, GFR 30-59 ml/min   Headache(784.0)   1. Altered mental status. Possibly related to a hypertensive encephalopathy. Patient has a history of  noncompliance. He'll be restarted on his amlodipine. We will hold his diuretics due to elevated creatinine. We'll use when necessary hydralazine. Due to concern for underlying CVA, we will pursue further workup including MRI/MRA of the head, carotid Dopplers and 2-D echocardiogram. Neurology is already following the patient. CT angiogram of the head and neck and chest were unremarkable for acute findings. 2. Right-sided weakness. Etiology is not clear. Review of ER/neurology note indicates some inconsistencies. We'll await further workup. 3. Chest pain. Patient had cardiac catheterization done in 2010 Which showed nonobstructive coronary disease. we'll cycle cardiac markers. I suspect his pain is likely musculoskeletal in origin since it is reproducible.  4.  headache. Likely related to hypertension. Continue to monitor. 5. Chronic kidney disease stage III. We'll have to monitor renal function especially in light of significant IV dye load. Baseline creatinine is around 1.4-1.5.  Neurology, Dr. Roseanne Reno  Code Status: full code Family Communication: discussed with patient and wife at the bedside Disposition Plan: discharge home once improved  Time spent:  [facility] Pager [phone]

## 2013-04-01 NOTE — ED Notes (Signed)
Swallow screen not conducted prior to leaving to expedite departure.  NIH stroke scale at 2.

## 2013-04-01 NOTE — Progress Notes (Signed)
SLP Cancellation Note  Patient Details Name: Kevin Jefferson MRN: [id number] DOB: Aug 19, 1965   Cancelled treatment:       Reason Eval/Treat Not Completed: SLP screened, no needs identified, will sign off  Kevin FowlerKaren Tavares Levinson MS, CCC-SLP [phone] [facility]Kevin Jefferson 04/01/2013, 11:09 AM

## 2013-04-01 NOTE — Code Documentation (Signed)
[facility], initial complaint of chest pain, following by generalized weakness. Code stroke called at 2335 due to increased left side weakness and decreased sensation. [facility] via [place] [place], LKW 2030, [facility], stroke team and neurologist at bedside 0038. Head CT and [facility]. NIH 02, left leg drift with poor effort and left side numbness noted. Code Stroke cancelled per Dr. Roseanne RenoStewart at [phone]. Will continue to monitor

## 2013-04-01 NOTE — ED Notes (Signed)
Patient transported to CT 

## 2013-04-01 NOTE — ED Notes (Signed)
MD at bedside. 

## 2013-04-01 NOTE — Progress Notes (Signed)
MEDICATION RELATED NOTE  Pharmacy Re:  [facility] STUDY  Medications:  Scheduled:  . research study medication  300 mg Oral Once   Followed by  . [START ON 04/02/2013] research study medication  100 mg Oral Daily  . research study medication  180 mg Oral Once   Followed by  . [START ON 04/02/2013] research study medication  90 mg Oral BID    This patient is enrolled in the [employer] study and is receiving Aspirin or Ticagrelor.  Please do not order maintenance Aspirin for this patient while enrolled in the study.   Nadara MustardNita Missey Jefferson, PharmD., MS Clinical Pharmacist Pager:  [phone] Thank you for allowing pharmacy to be part of this patients care team. 04/01/2013,7:29 PM

## 2013-04-01 NOTE — Evaluation (Deleted)
Clinical/Bedside Swallow Evaluation Patient Details  Name: Kevin Jefferson MRN: [id number] Date of Birth: 03/18/1965  Today's Date: 04/01/2013 Time: 1030-1050 SLP Time Calculation (min): 20 min  Past Medical History:  Past Medical History  Diagnosis Date  . Hypertension    Past Surgical History: History reviewed. No pertinent past surgical history. HPI:   Kevin Jefferson is an 48 y.o. male with a history of hypertension and not compliant with taking medication regularly who developed acute onset of left-sided headache as well as dizziness and also complaining of photophobia. At one point he was nauseated and vomited. No clear focal deficits occurred.  CT scan of his head showed no acute intracranial abnormality. [facility] code stroke status for further management.  NIH stroke score was 2 with reduced level of alertness and drift of left lower extremity, although effort was clearly poor. Laboratory studies were unremarkable with no signs of acute metabolic abnormality. Serum glucose was 110. Urine drug screen is pending Code stroke was subsequently canceled.  BSE ordered per Stroke Protocol.   Assessment / Plan / Recommendation Clinical Impression  BSE completed.  Ororpharyngeal swallow functional for regular consistency and thin liquids with no outward clinical s/s of aspiration with any consistency.  No further Skilled ST indicated.   ST to sign off as education complete.      Aspiration Risk  None    Diet Recommendation Regular;Thin liquid   Liquid Administration via: Cup;Straw Medication Administration: Whole meds with liquid Supervision: Patient able to self feed    Other  Recommendations Oral Care Recommendations: Oral care BID   Follow Up Recommendations  None         Swallow Study Prior Functional Status     Lives at home with spouse.  No prior history of dysphagia   General Date of Onset: 03/31/13 HPI:  Kevin Jefferson is an 48 y.o. male with  a history of hypertension and not compliant with taking medication regularly who developed acute onset of left-sided headache as well as dizziness and also complaining of photophobia. At one point he was nauseated and vomited. No clear focal deficits occurred. His wife described him as being in somewhat dazed. CT scan of his head showed no acute intracranial abnormality. [facility] code stroke status for further management. Patient was somewhat drowsy but well oriented at the time of his arrival. No clear focal deficits were noted. NIH stroke score was 2 with reduced level of alertness and drift of left lower extremity, although effort was clearly poor. Laboratory studies were unremarkable with no signs of acute metabolic abnormality. Serum glucose was 110. Urine drug screen is pending Code stroke was subsequently canceled. CTA of head and neck was normal. Type of Study: Bedside swallow evaluation Diet Prior to this Study: NPO Temperature Spikes Noted: No Respiratory Status: Room air History of Recent Intubation: No Behavior/Cognition: Cooperative;Pleasant mood;Lethargic Oral Cavity - Dentition: Adequate natural dentition Self-Feeding Abilities: Able to feed self Patient Positioning: Upright in bed Baseline Vocal Quality: Clear Volitional Cough: Strong Volitional Swallow: Able to elicit    Oral/Motor/Sensory Function Overall Oral Motor/Sensory Function: Appears within functional limits for tasks assessed   Ice Chips Ice chips: Not tested   Thin Liquid Thin Liquid: Within functional limits Presentation: Cup;Straw    Nectar Thick Nectar Thick Liquid: Not tested   Honey Thick Honey Thick Liquid: Not tested   Puree Puree: Within functional limits Presentation: Self Fed;Spoon   Solid  GO    Solid: Within functional limits      Moreen Fowler MS, CCC-SLP [phone] [facility] 04/01/2013,10:59 AM

## 2013-04-01 NOTE — Progress Notes (Signed)
Utilization review completed.  

## 2013-04-01 NOTE — Progress Notes (Signed)
Subjective: MRI shows small temporal stroke.  LKW: 3/21 7pm tPA given: no mild deficits  Exam: Filed Vitals:   04/01/13 0600  BP: 176/98  Pulse: 74  Temp: 98.1 F (36.7 C)  Resp: 16   Gen: In bed, NAD MS: Awake, alert, interactive and appropriate.  AO:ZHYQMCN:PERRL, EOMI, left facial sensation decreased to temp Motor: inconsistent exam, initially gives weakness of right arm and leg, but with prompting improves strength. Initially has downward drift without pronation, but with prompting is able to hold it aloft.  Sensory:decreased on left face and arm to temp  Dopplers show no stenosis.   Impression: 48 yo M with small infarction likely secondary to hypertension. Not on any antiplatelets prior to admission. I suspect that some of his deficit is embellishment on real underlying symptoms. Also possible would be hypertensive urgency causing the confusion and stroke.   Recommendations: 1. HgbA1c, fasting lipid panel 2. Frequent neuro checks 3. Echocardiogram pending 4. Prophylactic therapy-Antiplatelet med: Aspirin - dose 325mg  5. Risk factor modification 6. Telemetry monitoring 7. PT consult, OT consult, Speech consult   Ritta SlotMcNeill Dailey Alberson, MD [facility] [phone]  If 7pm- 7am, please page neurology on call as listed in AMION.

## 2013-04-01 NOTE — Progress Notes (Addendum)
[facility] PROGRESS NOTE  Kevin Jefferson ZOX:[id number] DOB: 17-Jan-1965 DOA: 03/31/2013 PCP: No primary provider on file.  Assessment/Plan: 1-Encephalopathy; Improved.  Possibly related to a hypertensive encephalopathy. Due to concern for underlying CVA, we will pursue further workup including MRI/MRA of the head CT angiogram of the head and neck and chest were unremarkable for acute findings  2-Right-sided weakness. Etiology is not clear, hypertensive emergency ? . MRI, MRA ordered. Doppler;: Bilateral: 1-39% ICA stenosis. Vertebral artery flow is antegrade.  3-Chest pain. Patient had cardiac catheterization done in 2010 Which showed nonobstructive coronary disease. . I suspect his pain is likely musculoskeletal in origin since it is reproducible. Troponin times 2 negative. ECHO pending.  4-headache. Likely related to hypertension. Continue to monitor.  5-Chronic kidney disease stage III.Marland Kitchen Baseline creatinine is around 1.4-1.5. Improved with IV fluids. Cr Peak to 1.7.  6-HTN emergency; continue with Norvasc. PRN Hydralazine.      Code Status: full Code.  Family Communication: Care discussed with patient.  Disposition Plan: remain in hospital.    Consultants:  Neurology  Procedures: ECHO; Doppler; Preliminary findings: Bilateral: 1-39% ICA stenosis. Vertebral artery flow is antegrade.     Antibiotics:  none  HPI/Subjective: Feeling better. No significant chest pain. Still feel some what weak on left side.   Objective: Filed Vitals:   04/01/13 0600  BP: 176/98  Pulse: 74  Temp: 98.1 F (36.7 C)  Resp: 16   No intake or output data in the 24 hours ending 04/01/13 1256 Filed Weights   04/01/13 0600  Weight: 88.451 kg (195 lb)    Exam:   General:  No distress.   Cardiovascular: S 1, S 2 RRR  Respiratory: CTA  Abdomen: Bs, NT, ND  Musculoskeletal: trace edema.   Neuro; right upper and Lower extremities 4/5  Data Reviewed: Basic Metabolic  Panel:  Recent Labs Lab 03/31/13 2300 04/01/13 0745  NA 140 140  K 3.4* 4.0  CL 98 101  CO2 29 26  GLUCOSE 110* 99  BUN 18 16  CREATININE 1.70* 1.49*  CALCIUM 9.6 9.0   Liver Function Tests:  Recent Labs Lab 03/31/13 2300  AST 25  ALT 43  ALKPHOS 26*  BILITOT 0.5  PROT 7.6  ALBUMIN 4.0   No results found for this basename: LIPASE, AMYLASE,  in the last 168 hours No results found for this basename: AMMONIA,  in the last 168 hours CBC:  Recent Labs Lab 03/31/13 2300 04/01/13 0745  WBC 8.1 7.0  NEUTROABS 4.5  --   HGB 12.3* 11.8*  HCT 36.4* 34.5*  MCV 87.3 85.6  PLT 192 175   Cardiac Enzymes:  Recent Labs Lab 03/31/13 2300 04/01/13 0745  TROPONINI <0.30 <0.30   BNP (last 3 results) No results found for this basename: PROBNP,  in the last 8760 hours CBG: No results found for this basename: GLUCAP,  in the last 168 hours  No results found for this or any previous visit (from the past 240 hour(s)).   Studies: Ct Angio Head W/cm &/or Wo Cm  04/01/2013   EXAM: CT ANGIOGRAPHY HEAD AND NECK  TECHNIQUE: Multidetector CT imaging of the head and neck was performed using the standard protocol during bolus administration of intravenous contrast. Multiplanar CT image reconstructions and MIPs were obtained to evaluate the vascular anatomy. Carotid stenosis measurements (when applicable) are obtained utilizing NASCET criteria, using the distal internal carotid diameter as the denominator.  CONTRAST:  OMNIPAQUE IOHEXOL 350 MG/ML SOLN  COMPARISON:  None.  FINDINGS: CTA HEAD FINDINGS  Anterior circulation: Normal appearance of the cervical internal carotid arteries, petrous, cavernous and supra clinoid internal carotid arteries. A few scattered atherosclerotic plaques noted within the cavernous segments of the internal carotid arteries bilaterally. Widely patent anterior communicating artery. The right A1 segment is hypoplastic. Normal appearance of the anterior and middle  cerebral arteries.  Posterior circulation: Left vertebral artery is dominant, with normal appearance of the vertebral arteries, vertebrobasilar junction and basilar artery, as well as main branch vessels. Basilar artery is tortuous. Small bilateral posterior communicating arteries are present. Normal appearance of posterior cerebral arteries.  No large vessel occlusion, hemodynamically significant stenosis, dissection, luminal irregularity, contrast extravasation or aneurysm within the anterior nor posterior circulation.  Review of the MIP images confirms the above findings.  CTA NECK FINDINGS  The visualized intrathoracic aorta is of normal caliber. Incidental note made of a bovine arch. Visualized subclavian arteries are widely patent bilaterally. No high-grade stenosis at the origin of the great vessels.  The common carotid arteries are well opacified bilaterally without evidence of dissection or high-grade flow-limiting stenosis. Carotid bifurcations are normal. The internal carotid arteries are symmetric in well opacified bilaterally. No high-grade stenosis or carotid artery dissection.  Visualized external carotid arteries and their branches are within normal limits.  Both the vertebral arteries arise from the subclavian arteries. The left vertebral artery is dominant. No high-grade flow-limiting stenosis, dissection, or other abnormality identified within the vertebral arteries.  Review of the MIP images confirms the above findings.  Visualized soft tissues of the neck are within normal limits. Visualized lungs are clear.  IMPRESSION: CTA HEAD:  1. Normal CTA of the head without evidence of high-grade flow-limiting stenosis, proximal branch occlusion, or intracranial aneurysm.  CTA NECK:  1. Normal CTA of the neck without evidence of high-grade flow-limiting stenosis, dissection, or other acute abnormality. 2. Dominant left vertebral artery.   Electronically Signed   By: Rise MuBenjamin  McClintock M.D.   On:  04/01/2013 02:56   Dg Chest 2 View  03/31/2013   CLINICAL DATA:  Intermittent abdominal pain  EXAM: CHEST  2 VIEW  COMPARISON:  CT ABD/PELV WO CM dated 06/04/2012; DG CHEST 1V PORT dated 03/02/2011  FINDINGS: Mildly enlarged cardiac silhouette. No effusion, infiltrate, pneumothorax. No acute osseous abnormality.  IMPRESSION: No acute cardiopulmonary process.   Electronically Signed   By: Genevive BiStewart  Edmunds M.D.   On: 03/31/2013 23:57   Ct Head Wo Contrast  04/01/2013   CLINICAL DATA:  Chest pain, hypertension.  EXAM: CT HEAD WITHOUT CONTRAST  TECHNIQUE: Contiguous axial images were obtained from the base of the skull through the vertex without intravenous contrast.  COMPARISON:  CT HEAD W/O CM dated 11/25/2008  FINDINGS: No acute intracranial hemorrhage. No focal mass lesion. No CT evidence of acute infarction. No midline shift or mass effect. No hydrocephalus. Basilar cisterns are patent. Paranasal sinuses and mastoid air cells are clear.  IMPRESSION: Normal head CT.   Electronically Signed   By: Genevive BiStewart  Edmunds M.D.   On: 04/01/2013 00:35   Ct Angio Neck W/cm &/or Wo/cm  04/01/2013   EXAM: CT ANGIOGRAPHY HEAD AND NECK  TECHNIQUE: Multidetector CT imaging of the head and neck was performed using the standard protocol during bolus administration of intravenous contrast. Multiplanar CT image reconstructions and MIPs were obtained to evaluate the vascular anatomy. Carotid stenosis measurements (when applicable) are obtained utilizing NASCET criteria, using the distal internal carotid diameter as the denominator.  CONTRAST:  125mL OMNIPAQUE IOHEXOL 350  MG/ML SOLN  COMPARISON:  None.  FINDINGS: CTA HEAD FINDINGS  Anterior circulation: Normal appearance of the cervical internal carotid arteries, petrous, cavernous and supra clinoid internal carotid arteries. A few scattered atherosclerotic plaques noted within the cavernous segments of the internal carotid arteries bilaterally. Widely patent anterior communicating  artery. The right A1 segment is hypoplastic. Normal appearance of the anterior and middle cerebral arteries.  Posterior circulation: Left vertebral artery is dominant, with normal appearance of the vertebral arteries, vertebrobasilar junction and basilar artery, as well as main branch vessels. Basilar artery is tortuous. Small bilateral posterior communicating arteries are present. Normal appearance of posterior cerebral arteries.  No large vessel occlusion, hemodynamically significant stenosis, dissection, luminal irregularity, contrast extravasation or aneurysm within the anterior nor posterior circulation.  Review of the MIP images confirms the above findings.  CTA NECK FINDINGS  The visualized intrathoracic aorta is of normal caliber. Incidental note made of a bovine arch. Visualized subclavian arteries are widely patent bilaterally. No high-grade stenosis at the origin of the great vessels.  The common carotid arteries are well opacified bilaterally without evidence of dissection or high-grade flow-limiting stenosis. Carotid bifurcations are normal. The internal carotid arteries are symmetric in well opacified bilaterally. No high-grade stenosis or carotid artery dissection.  Visualized external carotid arteries and their branches are within normal limits.  Both the vertebral arteries arise from the subclavian arteries. The left vertebral artery is dominant. No high-grade flow-limiting stenosis, dissection, or other abnormality identified within the vertebral arteries.  Review of the MIP images confirms the above findings.  Visualized soft tissues of the neck are within normal limits. Visualized lungs are clear.  IMPRESSION: CTA HEAD:  1. Normal CTA of the head without evidence of high-grade flow-limiting stenosis, proximal branch occlusion, or intracranial aneurysm.  CTA NECK:  1. Normal CTA of the neck without evidence of high-grade flow-limiting stenosis, dissection, or other acute abnormality. 2. Dominant  left vertebral artery.   Electronically Signed   By: Rise Mu M.D.   On: 04/01/2013 02:56   Ct Angio Chest Aorta W/cm &/or Wo/cm  04/01/2013   CLINICAL DATA:  Left-sided chest pain. Left-sided weakness and numbness. Lethargy.  EXAM: CT ANGIOGRAPHY CHEST WITH CONTRAST  TECHNIQUE: Multidetector CT imaging of the chest was performed using the standard protocol during bolus administration of intravenous contrast. Multiplanar CT image reconstructions and MIPs were obtained to evaluate the vascular anatomy.  CONTRAST:  OMNIPAQUE IOHEXOL 350 MG/ML IV.  COMPARISON:  DG CHEST 2 VIEW dated 03/31/2013; DG CHEST 1V PORT dated 03/02/2011; DG CHEST 2 VIEW dated 11/25/2008  FINDINGS: Unenhanced images demonstrate no evidence of mural hematoma in the thoracic her of abdominal aorta. Calcified plaque is present in the distal thoracic aorta.  Enhanced images demonstrate no evidence of thoracic or upper abdominal aortic aneurysm or dissection. Mild atherosclerosis involving the thoracic aorta with predominantly noncalcified plaque. Bovine aortic arch anatomy is present (left common carotid artery arises from the innominate artery). No visible atherosclerosis involving the proximal great vessels.  Mild LAD coronary atherosclerosis. Cardiomegaly with left ventricular hypertrophy. No pericardial effusion. Central pulmonary arteries patent.  No significant mediastinal, hilar, or axillary lymphadenopathy. Visualized thyroid gland unremarkable.  Expected dependent atelectasis posteriorly in the lower lobes. Pulmonary parenchyma clear without localized airspace consolidation, interstitial disease, or parenchymal nodules or masses. Central airways patent without significant bronchial wall thickening.  Visualized upper abdomen unremarkable for the early arterial phase of enhancement. Bone window images unremarkable.  Review of the MIP images confirms the  above findings.  IMPRESSION: 1. No evidence of thoracic aortic aneurysm  or dissection. Mild atherosclerosis involving the mid and distal thoracic aorta with predominantly noncalcified plaque. 2. Bovine aortic arch anatomy. No visible atherosclerosis involving the proximal great vessels. 3. Cardiomegaly with left ventricular hypertrophy and mild LAD coronary atherosclerosis. 4.  No acute cardiopulmonary disease.   Electronically Signed   By: Hulan Saas M.D.   On: 04/01/2013 02:57    Scheduled Meds: . amLODipine  10 mg Oral Daily  . aspirin  300 mg Rectal Daily   Or  . aspirin  325 mg Oral Daily  . enoxaparin (LOVENOX) injection  40 mg Subcutaneous Q24H   Continuous Infusions: . sodium chloride 100 mL/hr at 04/01/13 1610    Active Problems:   Chest pain   Encephalopathy   Hypertensive urgency   Right sided weakness   CKD (chronic kidney disease) stage 3, GFR 30-59 ml/min   Headache(784.0)    Time spent: 35 minutes.     Hartley Barefoot A  [facility] Pager [phone]. If 7PM-7AM, please contact night-coverage at [email or web address], password [facility] 04/01/2013, 12:56 PM  LOS: 1 day

## 2013-04-01 NOTE — ED Provider Notes (Signed)
CSN: [id number]     Arrival date & time 03/31/13  [id number] History   First MD Initiated Contact with Patient 03/31/13 2306     Chief Complaint  Patient presents with  . Chest Pain     (Consider location/radiation/quality/duration/timing/severity/associated sxs/prior Treatment) HPI Comments: 48 year old male, history of hypertension, noncompliance with antihypertensives presented to an outside facility with a complaint of headache and chest pain. On my exam and history the patient complains of acute onset of chest pain which started approximately 2 weeks ago, it has been intermittent, severe at times, this evening it was more severe than usual and started around 8:00. This is located in the center of his chest, sometimes radiates to the back and this evening it was associated with a feeling of disorientation as he was wandering the street where his wife pick him up outside of a [employer]. He felt like he was just confused and disoriented, at the outside facility there was some suggestion of  lateralization of weakness and numbness in the second was activated. The patient has a slight changes mental status, is very sleepy, he arouses to voice and answers questions. He denies any history of stroke, cardiac disease or diabetes. CT scan obtained at the outside facility showed no signs of acute hemorrhage or stroke.  Patient is a 48 y.o. male presenting with chest pain. The history is provided by the patient.  Chest Pain   Past Medical History  Diagnosis Date  . Hypertension    History reviewed. No pertinent past surgical history. History reviewed. No pertinent family history. History  Substance Use Topics  . Smoking status: Never Smoker   . Smokeless tobacco: Not on file  . Alcohol Use: No    Review of Systems  Unable to perform ROS: Mental status change  Cardiovascular: Positive for chest pain.      Allergies  Review of patient's allergies indicates no known allergies.  Home  Medications   Current Outpatient Rx  Name  Route  Sig  Dispense  Refill  . acetaminophen (TYLENOL) 500 MG tablet   Oral   Take 1,500 mg by mouth every 6 (six) hours as needed for moderate pain or headache.         Marland Kitchen. amLODipine (NORVASC) 10 MG tablet   Oral   Take 10 mg by mouth daily.         Marland Kitchen. triamterene-hydrochlorothiazide (DYAZIDE) 37.5-25 MG per capsule   Oral   Take 1 capsule by mouth daily.           BP 143/105  Pulse 81  Temp(Src) 98 F (36.7 C) (Oral)  Resp 12  SpO2 99% Physical Exam  Nursing note and vitals reviewed. Constitutional: He appears well-developed and well-nourished.  HENT:  Head: Normocephalic and atraumatic.  Mouth/Throat: Oropharynx is clear and moist. No oropharyngeal exudate.  Eyes: Conjunctivae and EOM are normal. Pupils are equal, round, and reactive to light. Right eye exhibits no discharge. Left eye exhibits no discharge. No scleral icterus.  Neck: Normal range of motion. Neck supple. No JVD present. No thyromegaly present.  No carotid bruits auscultated  Cardiovascular: Normal rate, regular rhythm, normal heart sounds and intact distal pulses.  Exam reveals no gallop and no friction rub.   No murmur heard. Pulmonary/Chest: Effort normal and breath sounds normal. No respiratory distress. He has no wheezes. He has no rales.  Abdominal: Soft. Bowel sounds are normal. He exhibits no distension and no mass. There is no tenderness.  Musculoskeletal: Normal range  of motion. He exhibits no edema and no tenderness.  Lymphadenopathy:    He has no cervical adenopathy.  Neurological: Coordination normal.  Somnolent but arousable to voice, slowed responses with answers to questions and following commands. 5 out of 5 strength in all 4 extremities, normal reflexes, normal sensation to light touch except for the left side of his face at the corner of the lip. Cranial nerves III through XII intact  Skin: Skin is warm and dry. No rash noted. No erythema.   Psychiatric: He has a normal mood and affect. His behavior is normal.    ED Course  Procedures (including critical care time) Labs Review Labs Reviewed  CBC WITH DIFFERENTIAL - Abnormal; Notable for the following:    RBC 4.17 (*)    Hemoglobin 12.3 (*)    HCT 36.4 (*)    All other components within normal limits  COMPREHENSIVE METABOLIC PANEL - Abnormal; Notable for the following:    Potassium 3.4 (*)    Glucose, Bld 110 (*)    Creatinine, Ser 1.70 (*)    Alkaline Phosphatase 26 (*)    GFR calc non Af Amer 46 (*)    GFR calc Af Amer 53 (*)    All other components within normal limits  URINALYSIS, ROUTINE W REFLEX MICROSCOPIC - Abnormal; Notable for the following:    Leukocytes, UA TRACE (*)    All other components within normal limits  TROPONIN I  PROTIME-INR  APTT  URINE RAPID DRUG SCREEN (HOSP PERFORMED)  URINE MICROSCOPIC-ADD ON   Imaging Review Ct Angio Head W/cm &/or Wo Cm  04/01/2013   EXAM: CT ANGIOGRAPHY HEAD AND NECK  TECHNIQUE: Multidetector CT imaging of the head and neck was performed using the standard protocol during bolus administration of intravenous contrast. Multiplanar CT image reconstructions and MIPs were obtained to evaluate the vascular anatomy. Carotid stenosis measurements (when applicable) are obtained utilizing NASCET criteria, using the distal internal carotid diameter as the denominator.  CONTRAST:  OMNIPAQUE IOHEXOL 350 MG/ML SOLN  COMPARISON:  None.  FINDINGS: CTA HEAD FINDINGS  Anterior circulation: Normal appearance of the cervical internal carotid arteries, petrous, cavernous and supra clinoid internal carotid arteries. A few scattered atherosclerotic plaques noted within the cavernous segments of the internal carotid arteries bilaterally. Widely patent anterior communicating artery. The right A1 segment is hypoplastic. Normal appearance of the anterior and middle cerebral arteries.  Posterior circulation: Left vertebral artery is dominant,  with normal appearance of the vertebral arteries, vertebrobasilar junction and basilar artery, as well as main branch vessels. Basilar artery is tortuous. Small bilateral posterior communicating arteries are present. Normal appearance of posterior cerebral arteries.  No large vessel occlusion, hemodynamically significant stenosis, dissection, luminal irregularity, contrast extravasation or aneurysm within the anterior nor posterior circulation.  Review of the MIP images confirms the above findings.  CTA NECK FINDINGS  The visualized intrathoracic aorta is of normal caliber. Incidental note made of a bovine arch. Visualized subclavian arteries are widely patent bilaterally. No high-grade stenosis at the origin of the great vessels.  The common carotid arteries are well opacified bilaterally without evidence of dissection or high-grade flow-limiting stenosis. Carotid bifurcations are normal. The internal carotid arteries are symmetric in well opacified bilaterally. No high-grade stenosis or carotid artery dissection.  Visualized external carotid arteries and their branches are within normal limits.  Both the vertebral arteries arise from the subclavian arteries. The left vertebral artery is dominant. No high-grade flow-limiting stenosis, dissection, or other abnormality identified within the  vertebral arteries.  Review of the MIP images confirms the above findings.  Visualized soft tissues of the neck are within normal limits. Visualized lungs are clear.  IMPRESSION: CTA HEAD:  1. Normal CTA of the head without evidence of high-grade flow-limiting stenosis, proximal branch occlusion, or intracranial aneurysm.  CTA NECK:  1. Normal CTA of the neck without evidence of high-grade flow-limiting stenosis, dissection, or other acute abnormality. 2. Dominant left vertebral artery.   Electronically Signed   By: Rise Mu M.D.   On: 04/01/2013 02:56   Dg Chest 2 View  03/31/2013   CLINICAL DATA:  Intermittent  abdominal pain  EXAM: CHEST  2 VIEW  COMPARISON:  CT ABD/PELV WO CM dated 06/04/2012; DG CHEST 1V PORT dated 03/02/2011  FINDINGS: Mildly enlarged cardiac silhouette. No effusion, infiltrate, pneumothorax. No acute osseous abnormality.  IMPRESSION: No acute cardiopulmonary process.   Electronically Signed   By: Genevive Bi M.D.   On: 03/31/2013 23:57   Ct Head Wo Contrast  04/01/2013   CLINICAL DATA:  Chest pain, hypertension.  EXAM: CT HEAD WITHOUT CONTRAST  TECHNIQUE: Contiguous axial images were obtained from the base of the skull through the vertex without intravenous contrast.  COMPARISON:  CT HEAD W/O CM dated 11/25/2008  FINDINGS: No acute intracranial hemorrhage. No focal mass lesion. No CT evidence of acute infarction. No midline shift or mass effect. No hydrocephalus. Basilar cisterns are patent. Paranasal sinuses and mastoid air cells are clear.  IMPRESSION: Normal head CT.   Electronically Signed   By: Genevive Bi M.D.   On: 04/01/2013 00:35   Ct Angio Neck W/cm &/or Wo/cm  04/01/2013   EXAM: CT ANGIOGRAPHY HEAD AND NECK  TECHNIQUE: Multidetector CT imaging of the head and neck was performed using the standard protocol during bolus administration of intravenous contrast. Multiplanar CT image reconstructions and MIPs were obtained to evaluate the vascular anatomy. Carotid stenosis measurements (when applicable) are obtained utilizing NASCET criteria, using the distal internal carotid diameter as the denominator.  CONTRAST:  OMNIPAQUE IOHEXOL 350 MG/ML SOLN  COMPARISON:  None.  FINDINGS: CTA HEAD FINDINGS  Anterior circulation: Normal appearance of the cervical internal carotid arteries, petrous, cavernous and supra clinoid internal carotid arteries. A few scattered atherosclerotic plaques noted within the cavernous segments of the internal carotid arteries bilaterally. Widely patent anterior communicating artery. The right A1 segment is hypoplastic. Normal appearance of the anterior  and middle cerebral arteries.  Posterior circulation: Left vertebral artery is dominant, with normal appearance of the vertebral arteries, vertebrobasilar junction and basilar artery, as well as main branch vessels. Basilar artery is tortuous. Small bilateral posterior communicating arteries are present. Normal appearance of posterior cerebral arteries.  No large vessel occlusion, hemodynamically significant stenosis, dissection, luminal irregularity, contrast extravasation or aneurysm within the anterior nor posterior circulation.  Review of the MIP images confirms the above findings.  CTA NECK FINDINGS  The visualized intrathoracic aorta is of normal caliber. Incidental note made of a bovine arch. Visualized subclavian arteries are widely patent bilaterally. No high-grade stenosis at the origin of the great vessels.  The common carotid arteries are well opacified bilaterally without evidence of dissection or high-grade flow-limiting stenosis. Carotid bifurcations are normal. The internal carotid arteries are symmetric in well opacified bilaterally. No high-grade stenosis or carotid artery dissection.  Visualized external carotid arteries and their branches are within normal limits.  Both the vertebral arteries arise from the subclavian arteries. The left vertebral artery is dominant. No high-grade flow-limiting stenosis,  dissection, or other abnormality identified within the vertebral arteries.  Review of the MIP images confirms the above findings.  Visualized soft tissues of the neck are within normal limits. Visualized lungs are clear.  IMPRESSION: CTA HEAD:  1. Normal CTA of the head without evidence of high-grade flow-limiting stenosis, proximal branch occlusion, or intracranial aneurysm.  CTA NECK:  1. Normal CTA of the neck without evidence of high-grade flow-limiting stenosis, dissection, or other acute abnormality. 2. Dominant left vertebral artery.   Electronically Signed   By: Rise Mu M.D.    On: 04/01/2013 02:56   Ct Angio Chest Aorta W/cm &/or Wo/cm  04/01/2013   CLINICAL DATA:  Left-sided chest pain. Left-sided weakness and numbness. Lethargy.  EXAM: CT ANGIOGRAPHY CHEST WITH CONTRAST  TECHNIQUE: Multidetector CT imaging of the chest was performed using the standard protocol during bolus administration of intravenous contrast. Multiplanar CT image reconstructions and MIPs were obtained to evaluate the vascular anatomy.  CONTRAST:  OMNIPAQUE IOHEXOL 350 MG/ML IV.  COMPARISON:  DG CHEST 2 VIEW dated 03/31/2013; DG CHEST 1V PORT dated 03/02/2011; DG CHEST 2 VIEW dated 11/25/2008  FINDINGS: Unenhanced images demonstrate no evidence of mural hematoma in the thoracic her of abdominal aorta. Calcified plaque is present in the distal thoracic aorta.  Enhanced images demonstrate no evidence of thoracic or upper abdominal aortic aneurysm or dissection. Mild atherosclerosis involving the thoracic aorta with predominantly noncalcified plaque. Bovine aortic arch anatomy is present (left common carotid artery arises from the innominate artery). No visible atherosclerosis involving the proximal great vessels.  Mild LAD coronary atherosclerosis. Cardiomegaly with left ventricular hypertrophy. No pericardial effusion. Central pulmonary arteries patent.  No significant mediastinal, hilar, or axillary lymphadenopathy. Visualized thyroid gland unremarkable.  Expected dependent atelectasis posteriorly in the lower lobes. Pulmonary parenchyma clear without localized airspace consolidation, interstitial disease, or parenchymal nodules or masses. Central airways patent without significant bronchial wall thickening.  Visualized upper abdomen unremarkable for the early arterial phase of enhancement. Bone window images unremarkable.  Review of the MIP images confirms the above findings.  IMPRESSION: 1. No evidence of thoracic aortic aneurysm or dissection. Mild atherosclerosis involving the mid and distal thoracic aorta  with predominantly noncalcified plaque. 2. Bovine aortic arch anatomy. No visible atherosclerosis involving the proximal great vessels. 3. Cardiomegaly with left ventricular hypertrophy and mild LAD coronary atherosclerosis. 4.  No acute cardiopulmonary disease.   Electronically Signed   By: Hulan Saas M.D.   On: 04/01/2013 02:57     EKG Interpretation None      MDM   Final diagnoses:  Headache  Right sided weakness  Hypertension  Chest wall pain    The patient is no acute cardiac findings, he does have a slight encephalopathy and some focal numbness to the left side of the face but now reports that he was having some difficulty with memory yesterday remembering numbers, required help with remembering simple tasks several times. I do have some concern with the chest pain over the last couple of weeks and this may be related to dissection, but also consider dissection of the neck vessels or stroke. Dr. Roseanne Reno with neurology has seen the patient as well and agrees that the patient needs CT angiograms of the chest neck and head. He was given labetalol prior to arrival with minimal improvement in his significant hypertension.  Discussed with the hospitalist, discussed with Dr. Roseanne Reno, patient will be admitted. Blood pressure has improved significantly. CT scans negative for acute aortic dissection, cervical artery  dissection or brain abnormality.  ED ECG REPORT  I personally interpreted this EKG   Date: 04/01/2013   Rate: 78  Rhythm: normal sinus rhythm  QRS Axis: left  Intervals: normal  ST/T Wave abnormalities: nonspecific ST/T changes  Conduction Disutrbances:none  Narrative Interpretation:   Old EKG Reviewed: none available   Vida Roller, MD 04/01/13 [id number]

## 2013-04-01 NOTE — ED Notes (Signed)
Report given to Alric SetonWoody, Charge [facility].  Informed that patient did not have his swallow screen prior to an expedited departure.

## 2013-04-01 NOTE — ED Notes (Signed)
[facility] here....report given to RipleyPaula.  [facility]  Will notify ED.

## 2013-04-01 NOTE — ED Notes (Signed)
Per EMS pt states he has not taken bp meds two days; pt states he felt dizzy and nausea around 8:30pm. Wife states pt was disoriented around 10pm. C/o chest pain on movement;

## 2013-04-01 NOTE — Evaluation (Signed)
Clinical/Bedside Swallow Evaluation Patient Details  Name: Kevin Jefferson MRN: [id number] Date of Birth: [birth year]  Today's Date: 04/01/2013 Time: 1030-1050 SLP Time Calculation (min): 20 min  Past Medical History:  Past Medical History  Diagnosis Date  . Hypertension    Past Surgical History: History reviewed. No pertinent past surgical history. HPI:   Kevin BonusJames Ade is an 48 y.o. male with a history of hypertension and not compliant with taking medication regularly who developed acute onset of left-sided headache as well as dizziness and also complaining of photophobia. At one point he was nauseated and vomited. No clear focal deficits occurred. His wife described him as being in somewhat dazed. CT scan of his head showed no acute intracranial abnormality. [facility] code stroke status for further management. NIH stroke score was 2 with reduced level of alertness and drift of left lower extremity, although effort was clearly poor. Laboratory studies were unremarkable with no signs of acute metabolic abnormality. Serum glucose was 110. Urine drug screen is pending.  BSE indicated per Stroke Protocol.     Assessment / Plan / Recommendation Clinical Impression  BSE completed.  Ororpharyngeal swallow functional for regular consistency and thin liquids with no outward clinical s/s of aspiration with any consistency.   No further Skilled ST indicated.   ST to sign off as education complete.      Aspiration Risk  None    Diet Recommendation Regular;Thin liquid   Liquid Administration via: Cup;Straw Medication Administration: Whole meds with liquid Supervision: Patient able to self feed    Other  Recommendations Oral Care Recommendations: Oral care BID   Follow Up Recommendations  None        Swallow Study Prior Functional Status   Lives at home with spouse.  No prior history of dysphagia     General Date of Onset: 03/31/13 HPI:  Kevin BonusJames Hobin is an 48  y.o. male with a history of hypertension and not compliant with taking medication regularly who developed acute onset of left-sided headache as well as dizziness and also complaining of photophobia. At one point he was nauseated and vomited. No clear focal deficits occurred. His wife described him as being in somewhat dazed. CT scan of his head showed no acute intracranial abnormality. [facility] code stroke status for further management. Patient was somewhat drowsy but well oriented at the time of his arrival. No clear focal deficits were noted. NIH stroke score was 2 with reduced level of alertness and drift of left lower extremity, although effort was clearly poor. Laboratory studies were unremarkable with no signs of acute metabolic abnormality. Serum glucose was 110. Urine drug screen is pending Code stroke was subsequently canceled. CTA of head and neck was normal. Type of Study: Bedside swallow evaluation Diet Prior to this Study: NPO Temperature Spikes Noted: No Respiratory Status: Room air History of Recent Intubation: No Behavior/Cognition: Cooperative;Pleasant mood;Lethargic Oral Cavity - Dentition: Adequate natural dentition Self-Feeding Abilities: Able to feed self Patient Positioning: Upright in bed Baseline Vocal Quality: Clear Volitional Cough: Strong Volitional Swallow: Able to elicit    Oral/Motor/Sensory Function Overall Oral Motor/Sensory Function: Appears within functional limits for tasks assessed   Ice Chips Ice chips: Not tested   Thin Liquid Thin Liquid: Within functional limits Presentation: Cup;Straw    Nectar Thick Nectar Thick Liquid: Not tested   Honey Thick Honey Thick Liquid: Not tested   Puree Puree: Within functional limits Presentation: Self Fed;Spoon  Solid   GO Functional Limitations: Swallowing Swallow Current Status (Z6109): 0 percent impaired, limited or restricted Swallow Goal Status (U0454): 0 percent impaired,  limited or restricted Swallow Discharge Status [phone]): 0 percent impaired, limited or restricted  Solid: Within functional limits      Moreen Fowler MS, CCC-SLP [phone] [facility] 04/01/2013,11:04 AM

## 2013-04-01 NOTE — Progress Notes (Signed)
*  PRELIMINARY RESULTS* Vascular Ultrasound Carotid Duplex has been completed.  Preliminary findings: Bilateral:  1-39% ICA stenosis.  Vertebral artery flow is antegrade.     Farrel DemarkJill Eunice, RDMS, RVT  04/01/2013, 9:59 AM

## 2013-04-01 NOTE — ED Notes (Signed)
MD at bedside, Neuro MD, and Stroke RN at bedside

## 2013-04-01 NOTE — ED Notes (Signed)
Internal MD at bedside.

## 2013-04-01 NOTE — Consult Note (Signed)
Reason for Consult: Headache, dizziness and photophobia as well as lethargy.  HPI:                                                                                                                                          Kevin Jefferson is an 48 y.o. male with a history of hypertension and not compliant with taking medication regularly who developed acute onset of left-sided headache as well as dizziness and also complaining of photophobia. At one point he was nauseated and vomited. No clear focal deficits occurred. His wife described him as being in somewhat dazed. CT scan of his head showed no acute intracranial abnormality. Patient was transferred from [facility] to [facility] in code stroke status for further management. Patient was somewhat drowsy but well oriented at the time of his arrival. No clear focal deficits were noted. NIH stroke score was 2 with reduced level of alertness and drift of left lower extremity, although effort was clearly poor. Laboratory studies were unremarkable with no signs of acute metabolic abnormality. Serum glucose was 110. Urine drug screen is pending Code stroke was subsequently canceled. CTA of head and neck was normal.  Past Medical History  Diagnosis Date  . Hypertension     History reviewed. No pertinent past surgical history.  History reviewed. No pertinent family history.  Social History:  reports that he has never smoked. He does not have any smokeless tobacco history on file. He reports that he does not drink alcohol or use illicit drugs.  No Known Allergies  MEDICATIONS:                                                                                                                     I have reviewed the patient's current medications.   ROS:  History obtained from spouse and the patient  General ROS: negative for - chills,  fatigue, fever, night sweats, weight gain or weight loss Psychological ROS: negative for - behavioral disorder, hallucinations, memory difficulties, mood swings or suicidal ideation Ophthalmic ROS: negative for - blurry vision, double vision, eye pain or loss of vision ENT ROS: negative for - epistaxis, nasal discharge, oral lesions, sore throat, tinnitus or vertigo Allergy and Immunology ROS: negative for - hives or itchy/watery eyes Hematological and Lymphatic ROS: negative for - bleeding problems, bruising or swollen lymph nodes Endocrine ROS: negative for - galactorrhea, hair pattern changes, polydipsia/polyuria or temperature intolerance Respiratory ROS: negative for - cough, hemoptysis, shortness of breath or wheezing Cardiovascular ROS: Positive for intermittent chest pain over the past couple of weeks Gastrointestinal ROS: negative for - abdominal pain, diarrhea, hematemesis, nausea/vomiting or stool incontinence Genito-Urinary ROS: negative for - dysuria, hematuria, incontinence or urinary frequency/urgency Musculoskeletal ROS: negative for - joint swelling or muscular weakness Neurological ROS: as noted in HPI Dermatological ROS: negative for rash and skin lesion changes   Blood pressure 186/124, pulse 79, temperature 98 F (36.7 C), temperature source Oral, resp. rate 14, SpO2 97.00%.   Neurologic Examination:                                                                                                      Mental Status: Somewhat lethargic, oriented x3.  Speech fluent without evidence of aphasia. Able to follow commands without difficulty. Cranial Nerves: II-Visual fields were normal. III/IV/VI-Pupils were equal and reacted. Extraocular movements were full and conjugate.    V/VII-no facial numbness and no facial weakness. VIII-normal. X-normal speech. Motor: Mild drift of left lower extremity with poor effort noted; motor exam is otherwise unremarkable. Sensory: Normal  throughout. Deep Tendon Reflexes: 1+ and symmetric. Plantars: Mute bilaterally Cerebellar: Normal finger-to-nose testing.  Lab Results  Component Value Date/Time   CHOL  Value: 207        ATP III CLASSIFICATION:  <200     mg/dL   Desirable  200-239  mg/dL   Borderline High  >=240    mg/dL   High       * 11/26/2008  8:35 AM    Results for orders placed during the hospital encounter of 03/31/13 (from the past 48 hour(s))  CBC WITH DIFFERENTIAL     Status: Abnormal   Collection Time    03/31/13 11:00 PM      Result Value Ref Range   WBC 8.1  4.0 - 10.5 K/uL   RBC 4.17 (*) 4.22 - 5.81 MIL/uL   Hemoglobin 12.3 (*) 13.0 - 17.0 g/dL   HCT 36.4 (*) 39.0 - 52.0 %   MCV 87.3  78.0 - 100.0 fL   MCH 29.5  26.0 - 34.0 pg   MCHC 33.8  30.0 - 36.0 g/dL   RDW 12.8  11.5 - 15.5 %   Platelets 192  150 - 400 K/uL   Neutrophils Relative % 55  43 - 77 %   Neutro Abs 4.5  1.7 - 7.7 K/uL  Lymphocytes Relative 34  12 - 46 %   Lymphs Abs 2.7  0.7 - 4.0 K/uL   Monocytes Relative 10  3 - 12 %   Monocytes Absolute 0.8  0.1 - 1.0 K/uL   Eosinophils Relative 1  0 - 5 %   Eosinophils Absolute 0.1  0.0 - 0.7 K/uL   Basophils Relative 1  0 - 1 %   Basophils Absolute 0.0  0.0 - 0.1 K/uL  COMPREHENSIVE METABOLIC PANEL     Status: Abnormal   Collection Time    03/31/13 11:00 PM      Result Value Ref Range   Sodium 140  137 - 147 mEq/L   Potassium 3.4 (*) 3.7 - 5.3 mEq/L   Chloride 98  96 - 112 mEq/L   CO2 29  19 - 32 mEq/L   Glucose, Bld 110 (*) 70 - 99 mg/dL   BUN 18  6 - 23 mg/dL   Creatinine, Ser 1.70 (*) 0.50 - 1.35 mg/dL   Calcium 9.6  8.4 - 10.5 mg/dL   Total Protein 7.6  6.0 - 8.3 g/dL   Albumin 4.0  3.5 - 5.2 g/dL   AST 25  0 - 37 U/L   ALT 43  0 - 53 U/L   Alkaline Phosphatase 26 (*) 39 - 117 U/L   Total Bilirubin 0.5  0.3 - 1.2 mg/dL   GFR calc non Af Amer 46 (*) >90 mL/min   GFR calc Af Amer 53 (*) >90 mL/min   Comment: (NOTE)     The eGFR has been calculated using the CKD EPI  equation.     This calculation has not been validated in all clinical situations.     eGFR's persistently <90 mL/min signify possible Chronic Kidney     Disease.  TROPONIN I     Status: None   Collection Time    03/31/13 11:00 PM      Result Value Ref Range   Troponin I <0.30  <0.30 ng/mL   Comment:            Due to the release kinetics of cTnI,     a negative result within the first hours     of the onset of symptoms does not rule out     myocardial infarction with certainty.     If myocardial infarction is still suspected,     repeat the test at appropriate intervals.  PROTIME-INR     Status: None   Collection Time    03/31/13 11:00 PM      Result Value Ref Range   Prothrombin Time 13.0  11.6 - 15.2 seconds   INR 1.00  0.00 - 1.49  APTT     Status: None   Collection Time    03/31/13 11:00 PM      Result Value Ref Range   aPTT 31  24 - 37 seconds    Dg Chest 2 View  03/31/2013   CLINICAL DATA:  Intermittent abdominal pain  EXAM: CHEST  2 VIEW  COMPARISON:  CT ABD/PELV WO CM dated 06/04/2012; DG CHEST 1V PORT dated 03/02/2011  FINDINGS: Mildly enlarged cardiac silhouette. No effusion, infiltrate, pneumothorax. No acute osseous abnormality.  IMPRESSION: No acute cardiopulmonary process.   Electronically Signed   By: Suzy Bouchard M.D.   On: 03/31/2013 23:57   Ct Head Wo Contrast  04/01/2013   CLINICAL DATA:  Chest pain, hypertension.  EXAM: CT HEAD WITHOUT CONTRAST  TECHNIQUE: Contiguous axial images  were obtained from the base of the skull through the vertex without intravenous contrast.  COMPARISON:  CT HEAD W/O CM dated 11/25/2008  FINDINGS: No acute intracranial hemorrhage. No focal mass lesion. No CT evidence of acute infarction. No midline shift or mass effect. No hydrocephalus. Basilar cisterns are patent. Paranasal sinuses and mastoid air cells are clear.  IMPRESSION: Normal head CT.   Electronically Signed   By: Suzy Bouchard M.D.   On: 04/01/2013 00:35     Assessment/Plan: Headache, dizziness, photophobia and nausea of unclear etiology. Patient has no clinical signs of acute stroke and no focal abnormalities. There is also afebrile. Acute meningitis was unlikely.  Recommendations: 1. MRI of the brain without contrast; no further neurodiagnostic studies if MRI is unremarkable. 2. Aspirin 81 mg per day. 3. Encourage compliance with taking blood pressure medication.  C.R. Nicole Kindred, MD [facility] [phone]  04/01/2013, 12:56 AM

## 2013-04-02 ENCOUNTER — Encounter (HOSPITAL_COMMUNITY): Payer: Self-pay | Admitting: *Deleted

## 2013-04-02 DIAGNOSIS — I517 Cardiomegaly: Secondary | ICD-10-CM

## 2013-04-02 DIAGNOSIS — M6281 Muscle weakness (generalized): Secondary | ICD-10-CM

## 2013-04-02 LAB — BASIC METABOLIC PANEL
BUN: 10 mg/dL (ref 6–23)
CHLORIDE: 103 meq/L (ref 96–112)
CO2: 26 mEq/L (ref 19–32)
CREATININE: 1.42 mg/dL — AB (ref 0.50–1.35)
Calcium: 9.3 mg/dL (ref 8.4–10.5)
GFR calc non Af Amer: 57 mL/min — ABNORMAL LOW (ref >90)
GFR, EST AFRICAN AMERICAN: 66 mL/min — AB (ref >90)
Glucose, Bld: 90 mg/dL (ref 70–99)
Potassium: 3.9 mEq/L (ref 3.7–5.3)
Sodium: 140 mEq/L (ref 137–147)

## 2013-04-02 LAB — SEDIMENTATION RATE: SED RATE: 15 mm/h (ref 0–16)

## 2013-04-02 LAB — ANTITHROMBIN III: AntiThromb III Func: 91 % (ref 75–120)

## 2013-04-02 LAB — RPR: RPR: NONREACTIVE

## 2013-04-02 LAB — HEMOGLOBIN A1C
Hgb A1c MFr Bld: 5.5 % (ref <5.7)
Mean Plasma Glucose: 111 mg/dL (ref <117)

## 2013-04-02 MED ORDER — TRIAMTERENE-HCTZ 37.5-25 MG PO CAPS
1.0000 | ORAL_CAPSULE | Freq: Every day | ORAL | Status: DC
  Administered 2013-04-03 – 2013-04-04 (×2): 1 via ORAL
  Filled 2013-04-02 (×2): qty 1

## 2013-04-02 MED ORDER — METOPROLOL TARTRATE 1 MG/ML IV SOLN: 5.0000 mg | Freq: Four times a day (QID) | INTRAVENOUS | Status: DC | PRN

## 2013-04-02 MED ORDER — SIMVASTATIN 20 MG PO TABS
20.0000 mg | ORAL_TABLET | Freq: Every day | ORAL | Status: DC
  Administered 2013-04-02 – 2013-04-04 (×3): 20 mg via ORAL
  Filled 2013-04-02 (×5): qty 1

## 2013-04-02 NOTE — Evaluation (Signed)
Physical Therapy Evaluation Patient Details Name: Kevin Jefferson MRN: [id number] DOB: Jan 26, 1965 Today's Date: 04/02/2013 Time: 4098-1191 PT Time Calculation (min): 21 min  PT Assessment / Plan / Recommendation History of Present Illness  Admitted with chest pain and various s/s of stroke. incl. L facial numbness, leaning to right, ? R sided weakness, ? slurred speech.  MRI show small L infarct which may not explain all that shows up.  Clinical Impression  Pt admitted with s/s of stroke. Pt's MMT and function are not what I would expect to see  (R and L sides equally weak) are disproportionate and the functioning doesn't match the MMT.  Pt currently limited functionally due to the problems listed. ( See problems list.)   Pt will benefit from PT to maximize function and safety in order to get ready for next venue listed below.     PT Assessment  Patient needs continued PT services    Follow Up Recommendations  CIR    Does the patient have the potential to tolerate intense rehabilitation      Barriers to Discharge Decreased caregiver support      Equipment Recommendations   (TBA)    Recommendations for Other Services Rehab consult   Frequency Min 3X/week    Precautions / Restrictions Precautions Precautions: Fall   Pertinent Vitals/Pain       Mobility  Bed Mobility Overal bed mobility: Needs Assistance Bed Mobility: Supine to Sit;Sit to Supine Supine to sit: Min guard Sit to supine: Min guard General bed mobility comments: struggle with UE's out of bed, struggle with LE into bed. Transfers Overall transfer level: Needs assistance Equipment used: Rolling walker (2 wheeled);None Transfers: Sit to/from Stand Sit to Stand: Mod assist General transfer comment: cuing for sequencing, steady assist until he could bring he Leg strength to bear. Ambulation/Gait Ambulation/Gait assistance: Mod assist Ambulation Distance (Feet): 30 Feet Assistive device: Rolling walker (2  wheeled) Gait Pattern/deviations: Step-through pattern Gait velocity interpretation: Below normal speed for age/gender General Gait Details: Heavy use of UE into RW. Variable gait pattern and gait speed, but not age appropriate, labored Modified Rankin (Stroke Patients Only) Pre-Morbid Rankin Score: No symptoms Modified Rankin: Moderately severe disability    Exercises     PT Diagnosis: Generalized weakness  PT Problem List: Decreased strength;Decreased activity tolerance;Decreased balance;Decreased mobility;Impaired sensation PT Treatment Interventions: Gait training;DME instruction;Functional mobility training;Stair training;Therapeutic activities;Cognitive remediation;Patient/family education;Balance training     PT Goals(Current goals can be found in the care plan section) Acute Rehab PT Goals Patient Stated Goal: Get home and back to work PT Goal Formulation: With patient Time For Goal Achievement: 04/02/13 Potential to Achieve Goals: Good  Visit Information  Last PT Received On: 04/02/13 Assistance Needed: +1 History of Present Illness: Admitted with chest pain and various s/s of stroke. incl. L facial numbness, leaning to right, ? R sided weakness, ? slurred speech.  MRI show small L infarct which may not explain all that shows up.       Prior Functioning  Home Living Family/patient expects to be discharged to:: Private residence Living Arrangements: Spouse/significant other Available Help at Discharge: Available PRN/intermittently (everyone works or is in school) Type of Home: House Home Access: Stairs to enter Secretary/administrator of Steps: 3 Entrance Stairs-Rails: Right;Left Home Layout: One level Home Equipment: None Prior Function Level of Independence: Independent Comments: shorter distance truck driver Communication Communication: No difficulties Dominant Hand: Right    Cognition  Cognition Arousal/Alertness: Awake/alert Behavior During Therapy: WFL  for tasks  assessed/performed Overall Cognitive Status: Within Functional Limits for tasks assessed    Extremity/Trunk Assessment Upper Extremity Assessment Upper Extremity Assessment: RUE deficits/detail;LUE deficits/detail RUE Deficits / Details: At time of this assessment grip 2/5, biceps 2+/5, triceps2+/5, shd flexion2+/5 (The problem is he utilized the RW with at least a 4/5 tricep) RUE Sensation:  (pt reports decr sensation L greater than R) LUE Deficits / Details: MMT grip 2/5, biceps 2+/5, triceps 2 to 2+/5, shoulder flexion 2+/5 (When not thinking he raised L arm up on the pillow behind he) LUE Sensation: decreased light touch (L dreased more than R) LUE Coordination: decreased gross motor Lower Extremity Assessment Lower Extremity Assessment: RLE deficits/detail;LLE deficits/detail RLE Deficits / Details: hip flexors3/5, quads 3+/5, hams 3+/5, df/pf2+/5 (pt able to utilize R LE in automatic task fetter than MMT) RLE Sensation: decreased light touch (L decr more than R) RLE Coordination: decreased gross motor LLE Deficits / Details: see R LE except quads test 3- to 3/5 LLE Sensation: decreased light touch LLE Coordination: decreased gross motor   Balance Balance Overall balance assessment: Needs assistance Sitting-balance support: No upper extremity supported;Single extremity supported Sitting balance-Leahy Scale: Good Standing balance support: Bilateral upper extremity supported;During functional activity Standing balance-Leahy Scale: Poor  End of Session PT - End of Session Equipment Utilized During Treatment: Gait belt Activity Tolerance: Patient tolerated treatment well Patient left: in bed;with call bell/phone within reach;with family/visitor present Nurse Communication: Mobility status  GP     Shamarr Faucett, Eliseo GumKenneth V 04/02/2013, 1:24 PM 04/02/2013  Grants BingKen Ilynn Stauffer, PT [phone]  (pager)

## 2013-04-02 NOTE — Progress Notes (Signed)
Stroke Team Progress Note  HISTORY Kevin Jefferson is an 48 y.o. male with a history of hypertension and not compliant with taking medication regularly who developed acute onset of left-sided headache as well as dizziness and also complaining of photophobia. At one point he was nauseated and vomited. No clear focal deficits occurred. His wife described him as being in somewhat dazed. CT scan of his head showed no acute intracranial abnormality. Patient was transferred from [facility] to [facility] in code stroke status for further management. Patient was somewhat drowsy but well oriented at the time of his arrival. No clear focal deficits were noted. NIH stroke score was 2 with reduced level of alertness and drift of left lower extremity, although effort was clearly poor. Laboratory studies were unremarkable with no signs of acute metabolic abnormality. Serum glucose was 110. Urine drug screen is pending Code stroke was subsequently canceled. CTA of head and neck was normal. Patient was last seen normal 04/01/2013 at 2030.   He was admitted for further evaluation and treatment.  SUBJECTIVE His wife is at the bedside.  Overall he feels his condition is stable. 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  OBJECTIVE Most recent Vital Signs: Filed Vitals:   04/01/13 2000 04/02/13 0000 04/02/13 0400 04/02/13 0754  BP: 168/95 145/75 146/88 156/98  Pulse: 72 71 81 70  Temp: 97.5 F (36.4 C) 97.3 F (36.3 C) 98.1 F (36.7 C) 98.3 F (36.8 C)  TempSrc:    Oral  Resp: $Remo'18 16 16 16  'nQXVu$ Height:      Weight:      SpO2: 97% 100% 99% 100%   CBG (last 3)  No results found for this basename: GLUCAP,  in the last 72 hours  IV Fluid Intake:     MEDICATIONS  . amLODipine  10 mg Oral Daily  . enoxaparin (LOVENOX) injection  40 mg Subcutaneous Q24H  . research study medication  100 mg Oral Daily  . research study medication  90 mg Oral BID   PRN:  acetaminophen, hydrALAZINE, HYDROcodone-acetaminophen, ondansetron  (ZOFRAN) IV, senna-docusate  Diet:  Cardiac thin liquids Activity:   DVT Prophylaxis:  Lovenox 40 mg sq daily   CLINICALLY SIGNIFICANT STUDIES Basic Metabolic Panel:  Recent Labs Lab 03/31/13 2300 04/01/13 0745  NA 140 140  K 3.4* 4.0  CL 98 101  CO2 29 26  GLUCOSE 110* 99  BUN 18 16  CREATININE 1.70* 1.49*  CALCIUM 9.6 9.0   Liver Function Tests:  Recent Labs Lab 03/31/13 2300  AST 25  ALT 43  ALKPHOS 26*  BILITOT 0.5  PROT 7.6  ALBUMIN 4.0   CBC:  Recent Labs Lab 03/31/13 2300 04/01/13 0745  WBC 8.1 7.0  NEUTROABS 4.5  --   HGB 12.3* 11.8*  HCT 36.4* 34.5*  MCV 87.3 85.6  PLT 192 175   Coagulation:  Recent Labs Lab 03/31/13 2300  LABPROT 13.0  INR 1.00   Cardiac Enzymes:  Recent Labs Lab 04/01/13 0745 04/01/13 1345 04/01/13 1827  TROPONINI <0.30 <0.30 <0.30   Urinalysis:  Recent Labs Lab 04/01/13 0120  COLORURINE YELLOW  LABSPEC 1.012  PHURINE 6.5  GLUCOSEU NEGATIVE  HGBUR NEGATIVE  BILIRUBINUR NEGATIVE  KETONESUR NEGATIVE  PROTEINUR NEGATIVE  UROBILINOGEN 0.2  NITRITE NEGATIVE  LEUKOCYTESUR TRACE*   Lipid Panel    Component Value Date/Time   CHOL 218* 04/02/2013 0643   TRIG 241* 04/02/2013 0643   HDL 30* 04/02/2013 0643   CHOLHDL 7.3 04/02/2013  0643   VLDL 48* 04/02/2013 0643   LDLCALC 140* 04/02/2013 0643   HgbA1C  No results found for this basename: HGBA1C    Urine Drug Screen:     Component Value Date/Time   LABOPIA NONE DETECTED 04/01/2013 0120   COCAINSCRNUR NONE DETECTED 04/01/2013 0120   LABBENZ NONE DETECTED 04/01/2013 0120   AMPHETMU NONE DETECTED 04/01/2013 0120   THCU NONE DETECTED 04/01/2013 0120   LABBARB NONE DETECTED 04/01/2013 0120    Alcohol Level: No results found for this basename: ETH,  in the last 168 hours   CT of the brain  04/01/2013    Normal head CT.      CT Angio Head   04/01/2013   1. Normal CTA of the head without evidence of high-grade flow-limiting stenosis, proximal branch occlusion, or  intracranial aneurysm.    CT Angio Neck  04/01/2013    1. Normal CTA of the neck without evidence of high-grade flow-limiting stenosis, dissection, or other acute abnormality. 2. Dominant left vertebral artery.    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 No swelling or hemorrhage.  Otherwise normal appearance the brain.    MRA of the brain  04/01/2013    No vascular stenosis or occlusion.    2D Echocardiogram  EF 60-65% with no source of embolus.   Carotid Doppler  No evidence of hemodynamically significant internal carotid artery stenosis. Vertebral artery flow is antegrade.   CXR  03/31/2013    No acute cardiopulmonary process.     CT Angio Chest Aorta  04/01/2013   1. No evidence of thoracic aortic aneurysm or dissection. Mild atherosclerosis involving the mid and distal thoracic aorta with predominantly noncalcified plaque. 2. Bovine aortic arch anatomy. No visible atherosclerosis involving the proximal great vessels. 3. Cardiomegaly with left ventricular hypertrophy and mild LAD coronary atherosclerosis. 4.  No acute cardiopulmonary disease.     EKG  normal sinus rhythm. For complete results please see formal report.   Therapy Recommendations CIR  Physical Exam   Middle-aged [place] American male currently not in distress.Awake alert. Afebrile. Head is nontraumatic. Neck is supple without bruit. Hearing is normal. Cardiac exam no murmur or gallop. Lungs are clear to auscultation. Distal pulses are well felt. Neurological Exam : Awake alert oriented x3 with normal speech and language function. Extraocular movements are full range without nystagmus. Face is symmetric without weakness. Fundi were not visualized. Vision acuity seems adequate. Tongue is midline. Motor system exam no upper or lower extremity drift. Mild grip weakness right greater than left but effort is poor. No lower extremity weakness. Subjective decreased  sensation on the left face and arm but not the leg. Splits the midline. Coordination is slow bilateral but accurate. Gait not tested. ASSESSMENT [name] is a 47 y.o. male presenting with Headache, dizziness and photophobia as well as lethargy.  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 Suspect brainstem stroke not seen on MRI. Infarct felt to be embolic secondary to unknown source.  On no antithrombotics prior to admission. Now Enrolled in [employer] Trial for secondary stroke prevention. Stroke work up underway.  Malignant Hypertension, 197/119 on arrival  Hyperlipidemia, LDL 140, on no statin PTA, now on no statin, goal LDL < 100  Chest pain, thought to be musculoskeletal Headache, resolved Chronic kidney disease stage III  Hospital day # 2  TREATMENT/PLAN  Continue SOCRATES for  secondary stroke prevention, comparison of 90-day treatment with ticagrelor vs aspirin for the prevention of major vascular events in patients with acute ischaemic stroke or TIA. TEE to look for embolic source. Arranged with Laura for tomorrow.  If positive for PFO (patent foramen ovale), check bilateral lower extremity venous dopplers to rule out DVT as possible source of stroke. (I have made patient NPO after midnight tonight). F/u HgbA1c Start zocor 20 mg daily check Hypercoagulable panel (minus factor 5 leiden and beta-2-glycoprotein as these test for venous clots) and vasculitic labs (C3, C4, CH50, ANA, ESR) HIV and RPR  Burnetta Sabin, MSN, RN, ANVP-BC, AGPCNP-BC [facility] Pager: [phone] 04/02/2013 11:16 AM  I have personally obtained a history, examined the patient, evaluated imaging results, and formulated the assessment and plan of care. I agree with the above. Antony Contras, MD   To contact Stroke Continuity provider, please refer to [email or web address] After hours, contact General  Neurology

## 2013-04-02 NOTE — Progress Notes (Signed)
Nurse notified of elevated blood pressure. Pt working with PT at the time of elevated pressure. Manual 168/94

## 2013-04-02 NOTE — Progress Notes (Signed)
Patient given hydralazine PRN per Dr Carmell Austriaegaldo order. Approximately 30 minutes after administration of medication pt wife alerted nurse that patient felt like it was "hard to breath". Upon nurse entering room, Pt lying in bed resting with eyes closed. Unlabored breathing with unchanged lung sounds. Oxygen levels 100% on room. HR remains NSR in 70s. BP now 152/98. Pt placed on 2L o2 via nasal cannula for patient comfort. Pt continued to close eyes and stated that he was "falling asleep" while I wastalking to him Dr Carmell Austriaegaldo paged to be made aware. Will see patient soon. Levonne Spillerhasidy Ashima Shrake, RN

## 2013-04-02 NOTE — Clinical Documentation Improvement (Signed)
  Admitted with CVA and "Hypertensive urgency". In the Coding world this term equated to "benign hypertension". Please clarify in record if this patient has "benign hypertension", "Accelerated hypertension" or "Malignant hypertension" to reflect severity of illness and risk of mortality. Thank you.  Thank You, Beverley FiedlerLaurie E Najat Olazabal ,RN Clinical Documentation Specialist:  [phone]  [facility]

## 2013-04-02 NOTE — Progress Notes (Signed)
Rehab Admissions Coordinator Note:  Patient was screened by Clois DupesBoyette, Lindsey Demonte Godwin for appropriateness for an Inpatient Acute Rehab Consult per PT recommendation.  At this time, we are recommending Inpatient Rehab consult. Please order if you feel appropriate.  Clois DupesBoyette, Cassadi Purdie Godwin 04/02/2013, 1:31 PM  I can be reached at [phone].

## 2013-04-02 NOTE — Progress Notes (Signed)
[facility] PROGRESS NOTE  Kevin Jefferson ZOX:[id number] DOB: 1965-03-05 DOA: 03/31/2013 PCP: No primary provider on file.  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 Right-sided weakness. Etiology is not clear. . WJX:BJYNWG acute infarction within the antero medial temporal lobe on the right. No swelling or hemorrhage. NFA:OZHY internal carotid arteries are widely patent into the brain Doppler;: Bilateral: 1-39% ICA stenosis. Vertebral artery flow is antegrade.  ECHO no source of thrombus.  TEE tomorrow.  LDL 140; Simvastatin daily.  Vasculitis panel ordered.   2-Encephalopathy; Improved.  Possibly related to a hypertensive encephalopathy. Due to concern for underlying CVA, we will pursue further workup including MRI/MRA of the head CT angiogram of the head and neck and chest were unremarkable for acute findings  3-Chest pain. Patient had cardiac catheterization done in 2010 Which showed nonobstructive coronary disease. . I suspect his pain is likely musculoskeletal in origin since it is reproducible. Troponin times 2 negative. ECHO; Ef 65 %.   4-Headache. Likely related to hypertension. Continue to monitor. Resolved.   5-Chronic kidney disease stage III.Marland Kitchen Baseline creatinine is around 1.4-1.5. Improved with IV fluids. Cr Peak to 1.7. Cr decrease to 1.4.   6-Malignant HTN; continue with Norvasc. Discontinue PRN Hydralazine , patient had episode of SOB. Will resume diuretic tomorrow. PRN labetalol.    Code Status: full Code.  Family Communication: Care discussed with patient.  Disposition Plan: remain in hospital.    Consultants:  Neurology  Procedures: ECHO;Left ventricle: The cavity size was normal. There was mild focal basal hypertrophy of the septum. Systolic function was normal. The estimated ejection fraction was in the range of 60% to 65%. Wall motion was normal; there were no regional wall motion  abnormalities. - Atrial septum: No defect or patent foramen ovale was identified.  Doppler; Preliminary findings: Bilateral: 1-39% ICA stenosis. Vertebral artery flow is antegrade.     Antibiotics:  none  HPI/Subjective: Patient developed some SOB after hydralazine, no rah. His oxygen sat 100 % room air. No respiratory distress. He is feeling better , he said. Breathing fine.  He is now sleepy, but wake to answer questions, move all 4 extremities, wife relates he usually sleep a lot at home.   Objective: Filed Vitals:   04/02/13 1546  BP: 139/69  Pulse: 83  Temp: 98.5 F (36.9 C)  Resp: 16    Intake/Output Summary (Last 24 hours) at 04/02/13 1551 Last data filed at 04/02/13 0905  Gross per 24 hour  Intake      0 ml  Output    250 ml  Net   -250 ml   Filed Weights   04/01/13 0600  Weight: 88.451 kg (195 lb)    Exam:   General:  No distress.   Cardiovascular: S 1, S 2 RRR  Respiratory: CTA  Abdomen: Bs, NT, ND  Musculoskeletal: trace edema.   Neuro; right upper and Lower extremities 4/5, speech clear.   Data Reviewed: Basic Metabolic Panel:  Recent Labs Lab 03/31/13 2300 04/01/13 0745 04/02/13 1303  NA 140 140 140  K 3.4* 4.0 3.9  CL 98 101 103  CO2 29 26 26   GLUCOSE 110* 99 90  BUN 18 16 10   CREATININE 1.70* 1.49* 1.42*  CALCIUM 9.6 9.0 9.3   Liver Function Tests:  Recent Labs Lab 03/31/13 2300  AST 25  ALT 43  ALKPHOS 26*  BILITOT 0.5  PROT 7.6  ALBUMIN 4.0   No results found for this  basename: LIPASE, AMYLASE,  in the last 168 hours No results found for this basename: AMMONIA,  in the last 168 hours CBC:  Recent Labs Lab 03/31/13 2300 04/01/13 0745  WBC 8.1 7.0  NEUTROABS 4.5  --   HGB 12.3* 11.8*  HCT 36.4* 34.5*  MCV 87.3 85.6  PLT 192 175   Cardiac Enzymes:  Recent Labs Lab 03/31/13 2300 04/01/13 0745 04/01/13 1345 04/01/13 1827  TROPONINI <0.30 <0.30 <0.30 <0.30   BNP (last 3 results) No results found for  this basename: PROBNP,  in the last 8760 hours CBG: No results found for this basename: GLUCAP,  in the last 168 hours  No results found for this or any previous visit (from the past 240 hour(s)).   Studies: Ct Angio Head W/cm &/or Wo Cm  04/01/2013   EXAM: CT ANGIOGRAPHY HEAD AND NECK  TECHNIQUE: Multidetector CT imaging of the head and neck was performed using the standard protocol during bolus administration of intravenous contrast. Multiplanar CT image reconstructions and MIPs were obtained to evaluate the vascular anatomy. Carotid stenosis measurements (when applicable) are obtained utilizing NASCET criteria, using the distal internal carotid diameter as the denominator.  CONTRAST:  125mL OMNIPAQUE IOHEXOL 350 MG/ML SOLN  COMPARISON:  None.  FINDINGS: CTA HEAD FINDINGS  Anterior circulation: Normal appearance of the cervical internal carotid arteries, petrous, cavernous and supra clinoid internal carotid arteries. A few scattered atherosclerotic plaques noted within the cavernous segments of the internal carotid arteries bilaterally. Widely patent anterior communicating artery. The right A1 segment is hypoplastic. Normal appearance of the anterior and middle cerebral arteries.  Posterior circulation: Left vertebral artery is dominant, with normal appearance of the vertebral arteries, vertebrobasilar junction and basilar artery, as well as main branch vessels. Basilar artery is tortuous. Small bilateral posterior communicating arteries are present. Normal appearance of posterior cerebral arteries.  No large vessel occlusion, hemodynamically significant stenosis, dissection, luminal irregularity, contrast extravasation or aneurysm within the anterior nor posterior circulation.  Review of the MIP images confirms the above findings.  CTA NECK FINDINGS  The visualized intrathoracic aorta is of normal caliber. Incidental note made of a bovine arch. Visualized subclavian arteries are widely patent bilaterally.  No high-grade stenosis at the origin of the great vessels.  The common carotid arteries are well opacified bilaterally without evidence of dissection or high-grade flow-limiting stenosis. Carotid bifurcations are normal. The internal carotid arteries are symmetric in well opacified bilaterally. No high-grade stenosis or carotid artery dissection.  Visualized external carotid arteries and their branches are within normal limits.  Both the vertebral arteries arise from the subclavian arteries. The left vertebral artery is dominant. No high-grade flow-limiting stenosis, dissection, or other abnormality identified within the vertebral arteries.  Review of the MIP images confirms the above findings.  Visualized soft tissues of the neck are within normal limits. Visualized lungs are clear.  IMPRESSION: CTA HEAD:  1. Normal CTA of the head without evidence of high-grade flow-limiting stenosis, proximal branch occlusion, or intracranial aneurysm.  CTA NECK:  1. Normal CTA of the neck without evidence of high-grade flow-limiting stenosis, dissection, or other acute abnormality. 2. Dominant left vertebral artery.   Electronically Signed   By: Rise MuBenjamin  McClintock M.D.   On: 04/01/2013 02:56   Dg Chest 2 View  03/31/2013   CLINICAL DATA:  Intermittent abdominal pain  EXAM: CHEST  2 VIEW  COMPARISON:  CT ABD/PELV WO CM dated 06/04/2012; DG CHEST 1V PORT dated 03/02/2011  FINDINGS: Mildly enlarged cardiac silhouette. No  effusion, infiltrate, pneumothorax. No acute osseous abnormality.  IMPRESSION: No acute cardiopulmonary process.   Electronically Signed   By: Genevive Bi M.D.   On: 03/31/2013 23:57   Ct Head Wo Contrast  04/01/2013   CLINICAL DATA:  Chest pain, hypertension.  EXAM: CT HEAD WITHOUT CONTRAST  TECHNIQUE: Contiguous axial images were obtained from the base of the skull through the vertex without intravenous contrast.  COMPARISON:  CT HEAD W/O CM dated 11/25/2008  FINDINGS: No acute intracranial hemorrhage.  No focal mass lesion. No CT evidence of acute infarction. No midline shift or mass effect. No hydrocephalus. Basilar cisterns are patent. Paranasal sinuses and mastoid air cells are clear.  IMPRESSION: Normal head CT.   Electronically Signed   By: Genevive Bi M.D.   On: 04/01/2013 00:35   Ct Angio Neck W/cm &/or Wo/cm  04/01/2013   EXAM: CT ANGIOGRAPHY HEAD AND NECK  TECHNIQUE: Multidetector CT imaging of the head and neck was performed using the standard protocol during bolus administration of intravenous contrast. Multiplanar CT image reconstructions and MIPs were obtained to evaluate the vascular anatomy. Carotid stenosis measurements (when applicable) are obtained utilizing NASCET criteria, using the distal internal carotid diameter as the denominator.  CONTRAST:  OMNIPAQUE IOHEXOL 350 MG/ML SOLN  COMPARISON:  None.  FINDINGS: CTA HEAD FINDINGS  Anterior circulation: Normal appearance of the cervical internal carotid arteries, petrous, cavernous and supra clinoid internal carotid arteries. A few scattered atherosclerotic plaques noted within the cavernous segments of the internal carotid arteries bilaterally. Widely patent anterior communicating artery. The right A1 segment is hypoplastic. Normal appearance of the anterior and middle cerebral arteries.  Posterior circulation: Left vertebral artery is dominant, with normal appearance of the vertebral arteries, vertebrobasilar junction and basilar artery, as well as main branch vessels. Basilar artery is tortuous. Small bilateral posterior communicating arteries are present. Normal appearance of posterior cerebral arteries.  No large vessel occlusion, hemodynamically significant stenosis, dissection, luminal irregularity, contrast extravasation or aneurysm within the anterior nor posterior circulation.  Review of the MIP images confirms the above findings.  CTA NECK FINDINGS  The visualized intrathoracic aorta is of normal caliber. Incidental note  made of a bovine arch. Visualized subclavian arteries are widely patent bilaterally. No high-grade stenosis at the origin of the great vessels.  The common carotid arteries are well opacified bilaterally without evidence of dissection or high-grade flow-limiting stenosis. Carotid bifurcations are normal. The internal carotid arteries are symmetric in well opacified bilaterally. No high-grade stenosis or carotid artery dissection.  Visualized external carotid arteries and their branches are within normal limits.  Both the vertebral arteries arise from the subclavian arteries. The left vertebral artery is dominant. No high-grade flow-limiting stenosis, dissection, or other abnormality identified within the vertebral arteries.  Review of the MIP images confirms the above findings.  Visualized soft tissues of the neck are within normal limits. Visualized lungs are clear.  IMPRESSION: CTA HEAD:  1. Normal CTA of the head without evidence of high-grade flow-limiting stenosis, proximal branch occlusion, or intracranial aneurysm.  CTA NECK:  1. Normal CTA of the neck without evidence of high-grade flow-limiting stenosis, dissection, or other acute abnormality. 2. Dominant left vertebral artery.   Electronically Signed   By: Rise Mu M.D.   On: 04/01/2013 02:56   Mr Brain Wo Contrast  04/01/2013   CLINICAL DATA:  Right-sided weakness and left facial numbness.  EXAM: MRI HEAD WITHOUT CONTRAST  MRA HEAD WITHOUT CONTRAST  TECHNIQUE: Multiplanar, multiecho pulse sequences of  the brain and surrounding structures were obtained without intravenous contrast. Angiographic images of the head were obtained using MRA technique without contrast.  COMPARISON:  Head CT same day.  FINDINGS: MRI HEAD FINDINGS  Diffusion imaging shows a linear focus of acute infarction in the anteromedial temporal lobe on the right. No other acute infarction. The brainstem and cerebellum are normal. The cerebral hemispheres are otherwise  normal. No other old or acute insult. No mass lesion, hemorrhage, hydrocephalus or extra-axial collection. No pituitary mass. No inflammatory sinus disease.  MRA HEAD FINDINGS  Both internal carotid arteries are widely patent into the brain. No siphon stenosis. The right internal carotid artery supplies the right middle cerebral artery in the right posterior cerebral artery. The left internal carotid artery supplies the left middle cerebral artery and both anterior cerebral arteries.  Both vertebral arteries are patent with the left being dominant. No basilar stenosis. Posterior circulation branch vessels appear patent and normal.  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 No swelling or hemorrhage.  Otherwise normal appearance the brain.  No vascular stenosis or occlusion.   Electronically Signed   By: Paulina Fusi M.D.   On: 04/01/2013 15:46   Mr Maxine Glenn Head/brain Wo Cm  04/01/2013   CLINICAL DATA:  Right-sided weakness and left facial numbness.  EXAM: MRI HEAD WITHOUT CONTRAST  MRA HEAD WITHOUT CONTRAST  TECHNIQUE: Multiplanar, multiecho pulse sequences of the brain and surrounding structures were obtained without intravenous contrast. Angiographic images of the head were obtained using MRA technique without contrast.  COMPARISON:  Head CT same day.  FINDINGS: MRI HEAD FINDINGS  Diffusion imaging shows a linear focus of acute infarction in the anteromedial temporal lobe on the right. No other acute infarction. The brainstem and cerebellum are normal. The cerebral hemispheres are otherwise normal. No other old or acute insult. No mass lesion, hemorrhage, hydrocephalus or extra-axial collection. No pituitary mass. No inflammatory sinus disease.  MRA HEAD FINDINGS  Both internal carotid arteries are widely patent into the brain. No siphon stenosis. The right internal carotid artery supplies the right middle cerebral artery in the right posterior cerebral artery. The  left internal carotid artery supplies the left middle cerebral artery and both anterior cerebral arteries.  Both vertebral arteries are patent with the left being dominant. No basilar stenosis. Posterior circulation branch vessels appear patent and normal.  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 No swelling or hemorrhage.  Otherwise normal appearance the brain.  No vascular stenosis or occlusion.   Electronically Signed   By: Paulina Fusi M.D.   On: 04/01/2013 15:46   Ct Angio Chest Aorta W/cm &/or Wo/cm  04/01/2013   CLINICAL DATA:  Left-sided chest pain. Left-sided weakness and numbness. Lethargy.  EXAM: CT ANGIOGRAPHY CHEST WITH CONTRAST  TECHNIQUE: Multidetector CT imaging of the chest was performed using the standard protocol during bolus administration of intravenous contrast. Multiplanar CT image reconstructions and MIPs were obtained to evaluate the vascular anatomy.  CONTRAST:  OMNIPAQUE IOHEXOL 350 MG/ML IV.  COMPARISON:  DG CHEST 2 VIEW dated 03/31/2013; DG CHEST 1V PORT dated 03/02/2011; DG CHEST 2 VIEW dated 11/25/2008  FINDINGS: Unenhanced images demonstrate no evidence of mural hematoma in the thoracic her of abdominal aorta. Calcified plaque is present in the distal thoracic aorta.  Enhanced images demonstrate no evidence of thoracic or upper abdominal aortic aneurysm or dissection. Mild atherosclerosis involving the thoracic aorta with predominantly noncalcified  plaque. Bovine aortic arch anatomy is present (left common carotid artery arises from the innominate artery). No visible atherosclerosis involving the proximal great vessels.  Mild LAD coronary atherosclerosis. Cardiomegaly with left ventricular hypertrophy. No pericardial effusion. Central pulmonary arteries patent.  No significant mediastinal, hilar, or axillary lymphadenopathy. Visualized thyroid gland unremarkable.  Expected dependent atelectasis posteriorly in the lower lobes.  Pulmonary parenchyma clear without localized airspace consolidation, interstitial disease, or parenchymal nodules or masses. Central airways patent without significant bronchial wall thickening.  Visualized upper abdomen unremarkable for the early arterial phase of enhancement. Bone window images unremarkable.  Review of the MIP images confirms the above findings.  IMPRESSION: 1. No evidence of thoracic aortic aneurysm or dissection. Mild atherosclerosis involving the mid and distal thoracic aorta with predominantly noncalcified plaque. 2. Bovine aortic arch anatomy. No visible atherosclerosis involving the proximal great vessels. 3. Cardiomegaly with left ventricular hypertrophy and mild LAD coronary atherosclerosis. 4.  No acute cardiopulmonary disease.   Electronically Signed   By: Hulan Saas M.D.   On: 04/01/2013 02:57    Scheduled Meds: . amLODipine  10 mg Oral Daily  . enoxaparin (LOVENOX) injection  40 mg Subcutaneous Q24H  . research study medication  100 mg Oral Daily  . research study medication  90 mg Oral BID  . simvastatin  20 mg Oral q1800  . [START ON 04/03/2013] triamterene-hydrochlorothiazide  1 capsule Oral Daily   Continuous Infusions:    Active Problems:   Chest pain   Encephalopathy   Hypertensive urgency   Right sided weakness   CKD (chronic kidney disease) stage 3, GFR 30-59 ml/min   Headache(784.0)    Time spent: 35 minutes.     Hartley Barefoot A  [facility] Pager [phone]. If 7PM-7AM, please contact night-coverage at [email or web address], password [facility] 04/02/2013, 3:51 PM  LOS: 2 days

## 2013-04-02 NOTE — Progress Notes (Signed)
Repeat order for swallow evaluation received. Swallow evaluation complete 3/22 and indicated a functional oropharyngeal swallow with recommendations for regular diet and no SLP f/u. Spoke with RN who reported patient consuming pos well without overt indication of aspiration. Will defer order. Please reconsult if needed. Ferdinand LangoLeah Shenea Giacobbe MA, CCC-SLP [phone]

## 2013-04-02 NOTE — Evaluation (Signed)
Occupational Therapy Evaluation Patient Details Name: Kevin Jefferson MRN: [id number] DOB: [birth year] Today's Date: 04/02/2013 Time: 1340-1400 OT Time Calculation (min): 20 min  OT Assessment / Plan / Recommendation History of present illness Admitted with chest pain and various s/s of stroke. incl. L facial numbness, leaning to right, ? R sided weakness, ? slurred speech.  MRI show small L infarct which may not explain all that shows up.   Clinical Impression   Pt presents with inconsistencies in functional skills vs his performance during formal testing of UE strength and sensation and from PT to OT session this date.  R hand coordination appears equal to that of L. Will follow acutely to address ADL and safety.      OT Assessment  Patient needs continued OT Services    Follow Up Recommendations  CIR    Barriers to Discharge      Equipment Recommendations       Recommendations for Other Services    Frequency  Min 2X/week    Precautions / Restrictions Precautions Precautions: Fall Restrictions Weight Bearing Restrictions: No   Pertinent Vitals/Pain Limited activity due to increased BP, no pain    ADL  Eating/Feeding: Independent Where Assessed - Eating/Feeding: Edge of bed Grooming: Wash/dry hands;Min guard Where Assessed - Grooming: Unsupported standing Upper Body Dressing: Set up Where Assessed - Upper Body Dressing: Unsupported sitting Lower Body Dressing: Supervision/safety Where Assessed - Lower Body Dressing: Unsupported sitting Toilet Transfer: Minimal assistance Toilet Transfer Method: Stand pivot Transfers/Ambulation Related to ADLs: didn not ambulate with pt ADL Comments: Pt able to easily long sit in bed.    OT Diagnosis: Generalized weakness  OT Problem List: Decreased strength;Decreased activity tolerance;Impaired balance (sitting and/or standing);Impaired UE functional use;Decreased coordination;Impaired sensation OT Treatment Interventions: Self-care/ADL  training;Therapeutic exercise;DME and/or AE instruction;Patient/family education;Balance training   OT Goals(Current goals can be found in the care plan section) Acute Rehab OT Goals Patient Stated Goal: Get home and back to work OT Goal Formulation: With patient Time For Goal Achievement: 04/09/13 Potential to Achieve Goals: Good ADL Goals Pt Will Perform Grooming: with supervision;standing Pt Will Perform Lower Body Bathing: with supervision;sit to/from stand Pt Will Perform Lower Body Dressing: with supervision;sit to/from stand Pt Will Transfer to Toilet: with supervision;ambulating Pt Will Perform Toileting - Clothing Manipulation and hygiene: with supervision;sit to/from stand Pt Will Perform Tub/Shower Transfer: Shower transfer;with supervision;ambulating;grab bars  Visit Information  Last OT Received On: 04/02/13 Assistance Needed: +1 History of Present Illness: Admitted with chest pain and various s/s of stroke. incl. L facial numbness, leaning to right, ? R sided weakness, ? slurred speech.  MRI show small L infarct which may not explain all that shows up.       Prior Functioning     Home Living Family/patient expects to be discharged to:: Private residence Living Arrangements: Spouse/significant other Available Help at Discharge: Available PRN/intermittently Type of Home: House Home Access: Stairs to enter Secretary/administrator of Steps: 3 Entrance Stairs-Rails: Right;Left Home Layout: One level Home Equipment: Grab bars - tub/shower Prior Function Level of Independence: Independent Comments: shorter distance truck driver Communication Communication: No difficulties Dominant Hand: Right         Vision/Perception Vision - History Baseline Vision: No visual deficits Patient Visual Report: No change from baseline   Cognition  Cognition Arousal/Alertness: Awake/alert Behavior During Therapy: WFL for tasks assessed/performed Overall Cognitive Status:  Within Functional Limits for tasks assessed    Extremity/Trunk Assessment Upper Extremity Assessment Upper Extremity Assessment: RUE deficits/detail;LUE  deficits/detail RUE Deficits / Details: Pt observed to be able to reach in all planes with full ROM and functional use for use of cell phone and to donn socks.  No drift noted with vision occluded. RUE Sensation:  (pt reports decr sensation L greater than R) RUE Coordination:  (impaired alternating thumb to finger movement, slow) LUE Deficits / Details: at least 3/5, observed to use functionally to hold phone to ear, donn sock, maintain shoulder at 90 degrees for 10 seconds without drift LUE Sensation: decreased light touch (reports L UE feeling dull to light touch and pain, intact te) LUE Coordination: decreased fine motor;decreased gross motor (decreased alternating thumb to finger, slow) Lower Extremity Assessment Lower Extremity Assessment: Defer to PT evaluation RLE Deficits / Details: hip flexors3/5, quads 3+/5, hams 3+/5, df/pf2+/5 (pt able to utilize R LE in automatic task fetter than MMT) RLE Sensation: decreased light touch (L decr more than R) RLE Coordination: decreased gross motor LLE Deficits / Details: see R LE except quads test 3- to 3/5 LLE Sensation: decreased light touch LLE Coordination: decreased gross motor Cervical / Trunk Assessment Cervical / Trunk Assessment: Normal     Mobility Bed Mobility Overal bed mobility: Needs Assistance Bed Mobility: Supine to Sit;Sit to Supine Supine to sit: Supervision Sit to supine: Supervision General bed mobility comments: appears to require increased effort Transfers Overall transfer level: Needs assistance Equipment used: Rolling walker (2 wheeled);None Transfers: Sit to/from Stand Sit to Stand: min assist General transfer comment: cuing for sequencing, steady assist until he could bring he Leg strength to bear.     Exercise     Balance Balance Overall balance  assessment: Needs assistance Sitting-balance support: No upper extremity supported;Single extremity supported Sitting balance-Leahy Scale: Good Standing balance support: Bilateral upper extremity supported;During functional activity Standing balance-Leahy Scale: Poor   End of Session OT - End of Session Activity Tolerance: Patient tolerated treatment well Patient left: in bed;with call bell/phone within reach;with bed alarm set;with family/visitor present Nurse Communication:  (has not received lunch)  GO     Evern BioMayberry, Lakechia Nay Lynn 04/02/2013, 2:15 PM [phone]

## 2013-04-02 NOTE — Progress Notes (Signed)
  Echocardiogram 2D Echocardiogram has been performed.  Kevin Jefferson, [name] 04/02/2013, 11:37 AM

## 2013-04-02 NOTE — Progress Notes (Signed)
UR completed. Patient changed to inpatient- acute CVA 

## 2013-04-03 ENCOUNTER — Encounter (HOSPITAL_COMMUNITY): Payer: Self-pay | Admitting: Physical Medicine and Rehabilitation

## 2013-04-03 ENCOUNTER — Encounter (HOSPITAL_COMMUNITY)
Admission: EM | Disposition: A | Discharge status: Home Health | Payer: Self-pay | Source: Home / Self Care | Attending: Internal Medicine

## 2013-04-03 DIAGNOSIS — I639 Cerebral infarction, unspecified: Secondary | ICD-10-CM

## 2013-04-03 DIAGNOSIS — I6340 Cerebral infarction due to embolism of unspecified cerebral artery: Secondary | ICD-10-CM

## 2013-04-03 DIAGNOSIS — I6789 Other cerebrovascular disease: Secondary | ICD-10-CM

## 2013-04-03 HISTORY — PX: TEE WITHOUT CARDIOVERSION: SHX5443

## 2013-04-03 LAB — ANA: Anti Nuclear Antibody(ANA): NEGATIVE

## 2013-04-03 LAB — LUPUS ANTICOAGULANT PANEL
DRVVT: 48.8 secs — ABNORMAL HIGH (ref <42.9)
Lupus Anticoagulant: NOT DETECTED
PTT Lupus Anticoagulant: 42.8 secs (ref 28.0–43.0)
dRVVT Incubated 1:1 Mix: 39.3 secs (ref <42.9)

## 2013-04-03 LAB — C3 COMPLEMENT: C3 Complement: 128 mg/dL (ref 90–180)

## 2013-04-03 LAB — C4 COMPLEMENT: COMPLEMENT C4, BODY FLUID: 21 mg/dL (ref 10–40)

## 2013-04-03 LAB — HIV ANTIBODY (ROUTINE TESTING W REFLEX): HIV: NONREACTIVE

## 2013-04-03 LAB — PROTHROMBIN GENE MUTATION

## 2013-04-03 LAB — CARDIOLIPIN ANTIBODIES, IGG, IGM, IGA
ANTICARDIOLIPIN IGG: 8 GPL U/mL — AB (ref <23)
Anticardiolipin IgA: 9 APL U/mL — ABNORMAL LOW (ref <22)
Anticardiolipin IgM: 1 MPL U/mL — ABNORMAL LOW (ref <11)

## 2013-04-03 SURGERY — ECHOCARDIOGRAM, TRANSESOPHAGEAL
Anesthesia: Moderate Sedation

## 2013-04-03 MED ORDER — MIDAZOLAM HCL 5 MG/ML IJ SOLN
INTRAMUSCULAR | Status: AC
  Filled 2013-04-03: qty 2

## 2013-04-03 MED ORDER — FENTANYL CITRATE 0.05 MG/ML IJ SOLN
INTRAMUSCULAR | Status: AC
  Filled 2013-04-03: qty 2

## 2013-04-03 MED ORDER — SODIUM CHLORIDE 0.9 % IV SOLN
INTRAVENOUS | Status: DC
  Administered 2013-04-03: 13:00:00 via INTRAVENOUS

## 2013-04-03 MED ORDER — FENTANYL CITRATE 0.05 MG/ML IJ SOLN
INTRAMUSCULAR | Status: DC | PRN
  Administered 2013-04-03 (×2): 25 ug via INTRAVENOUS

## 2013-04-03 MED ORDER — BUTAMBEN-TETRACAINE-BENZOCAINE 2-2-14 % EX AERO
INHALATION_SPRAY | CUTANEOUS | Status: DC | PRN
  Administered 2013-04-03: 2 via TOPICAL

## 2013-04-03 MED ORDER — MIDAZOLAM HCL 10 MG/2ML IJ SOLN
INTRAMUSCULAR | Status: DC | PRN
Start: 2013-04-03 — End: 2013-04-03
  Administered 2013-04-03 (×2): 2 mg via INTRAVENOUS

## 2013-04-03 NOTE — H&P (View-Only) (Signed)
[facility] PROGRESS NOTE  Kevin Jefferson ZOX:[id number] DOB: 1965-03-05 DOA: 03/31/2013 PCP: No primary provider on file.  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 Right-sided weakness. Etiology is not clear. . WJX:BJYNWG acute infarction within the antero medial temporal lobe on the right. No swelling or hemorrhage. NFA:OZHY internal carotid arteries are widely patent into the brain Doppler;: Bilateral: 1-39% ICA stenosis. Vertebral artery flow is antegrade.  ECHO no source of thrombus.  TEE tomorrow.  LDL 140; Simvastatin daily.  Vasculitis panel ordered.   2-Encephalopathy; Improved.  Possibly related to a hypertensive encephalopathy. Due to concern for underlying CVA, we will pursue further workup including MRI/MRA of the head CT angiogram of the head and neck and chest were unremarkable for acute findings  3-Chest pain. Patient had cardiac catheterization done in 2010 Which showed nonobstructive coronary disease. . I suspect his pain is likely musculoskeletal in origin since it is reproducible. Troponin times 2 negative. ECHO; Ef 65 %.   4-Headache. Likely related to hypertension. Continue to monitor. Resolved.   5-Chronic kidney disease stage III.Marland Kitchen Baseline creatinine is around 1.4-1.5. Improved with IV fluids. Cr Peak to 1.7. Cr decrease to 1.4.   6-Malignant HTN; continue with Norvasc. Discontinue PRN Hydralazine , patient had episode of SOB. Will resume diuretic tomorrow. PRN labetalol.    Code Status: full Code.  Family Communication: Care discussed with patient.  Disposition Plan: remain in hospital.    Consultants:  Neurology  Procedures: ECHO;Left ventricle: The cavity size was normal. There was mild focal basal hypertrophy of the septum. Systolic function was normal. The estimated ejection fraction was in the range of 60% to 65%. Wall motion was normal; there were no regional wall motion  abnormalities. - Atrial septum: No defect or patent foramen ovale was identified.  Doppler; Preliminary findings: Bilateral: 1-39% ICA stenosis. Vertebral artery flow is antegrade.     Antibiotics:  none  HPI/Subjective: Patient developed some SOB after hydralazine, no rah. His oxygen sat 100 % room air. No respiratory distress. He is feeling better , he said. Breathing fine.  He is now sleepy, but wake to answer questions, move all 4 extremities, wife relates he usually sleep a lot at home.   Objective: Filed Vitals:   04/02/13 1546  BP: 139/69  Pulse: 83  Temp: 98.5 F (36.9 C)  Resp: 16    Intake/Output Summary (Last 24 hours) at 04/02/13 1551 Last data filed at 04/02/13 0905  Gross per 24 hour  Intake      0 ml  Output    250 ml  Net   -250 ml   Filed Weights   04/01/13 0600  Weight: 88.451 kg (195 lb)    Exam:   General:  No distress.   Cardiovascular: S 1, S 2 RRR  Respiratory: CTA  Abdomen: Bs, NT, ND  Musculoskeletal: trace edema.   Neuro; right upper and Lower extremities 4/5, speech clear.   Data Reviewed: Basic Metabolic Panel:  Recent Labs Lab 03/31/13 2300 04/01/13 0745 04/02/13 1303  NA 140 140 140  K 3.4* 4.0 3.9  CL 98 101 103  CO2 29 26 26   GLUCOSE 110* 99 90  BUN 18 16 10   CREATININE 1.70* 1.49* 1.42*  CALCIUM 9.6 9.0 9.3   Liver Function Tests:  Recent Labs Lab 03/31/13 2300  AST 25  ALT 43  ALKPHOS 26*  BILITOT 0.5  PROT 7.6  ALBUMIN 4.0   No results found for this  basename: LIPASE, AMYLASE,  in the last 168 hours No results found for this basename: AMMONIA,  in the last 168 hours CBC:  Recent Labs Lab 03/31/13 2300 04/01/13 0745  WBC 8.1 7.0  NEUTROABS 4.5  --   HGB 12.3* 11.8*  HCT 36.4* 34.5*  MCV 87.3 85.6  PLT 192 175   Cardiac Enzymes:  Recent Labs Lab 03/31/13 2300 04/01/13 0745 04/01/13 1345 04/01/13 1827  TROPONINI <0.30 <0.30 <0.30 <0.30   BNP (last 3 results) No results found for  this basename: PROBNP,  in the last 8760 hours CBG: No results found for this basename: GLUCAP,  in the last 168 hours  No results found for this or any previous visit (from the past 240 hour(s)).   Studies: Ct Angio Head W/cm &/or Wo Cm  04/01/2013   EXAM: CT ANGIOGRAPHY HEAD AND NECK  TECHNIQUE: Multidetector CT imaging of the head and neck was performed using the standard protocol during bolus administration of intravenous contrast. Multiplanar CT image reconstructions and MIPs were obtained to evaluate the vascular anatomy. Carotid stenosis measurements (when applicable) are obtained utilizing NASCET criteria, using the distal internal carotid diameter as the denominator.  CONTRAST:  125mL OMNIPAQUE IOHEXOL 350 MG/ML SOLN  COMPARISON:  None.  FINDINGS: CTA HEAD FINDINGS  Anterior circulation: Normal appearance of the cervical internal carotid arteries, petrous, cavernous and supra clinoid internal carotid arteries. A few scattered atherosclerotic plaques noted within the cavernous segments of the internal carotid arteries bilaterally. Widely patent anterior communicating artery. The right A1 segment is hypoplastic. Normal appearance of the anterior and middle cerebral arteries.  Posterior circulation: Left vertebral artery is dominant, with normal appearance of the vertebral arteries, vertebrobasilar junction and basilar artery, as well as main branch vessels. Basilar artery is tortuous. Small bilateral posterior communicating arteries are present. Normal appearance of posterior cerebral arteries.  No large vessel occlusion, hemodynamically significant stenosis, dissection, luminal irregularity, contrast extravasation or aneurysm within the anterior nor posterior circulation.  Review of the MIP images confirms the above findings.  CTA NECK FINDINGS  The visualized intrathoracic aorta is of normal caliber. Incidental note made of a bovine arch. Visualized subclavian arteries are widely patent bilaterally.  No high-grade stenosis at the origin of the great vessels.  The common carotid arteries are well opacified bilaterally without evidence of dissection or high-grade flow-limiting stenosis. Carotid bifurcations are normal. The internal carotid arteries are symmetric in well opacified bilaterally. No high-grade stenosis or carotid artery dissection.  Visualized external carotid arteries and their branches are within normal limits.  Both the vertebral arteries arise from the subclavian arteries. The left vertebral artery is dominant. No high-grade flow-limiting stenosis, dissection, or other abnormality identified within the vertebral arteries.  Review of the MIP images confirms the above findings.  Visualized soft tissues of the neck are within normal limits. Visualized lungs are clear.  IMPRESSION: CTA HEAD:  1. Normal CTA of the head without evidence of high-grade flow-limiting stenosis, proximal branch occlusion, or intracranial aneurysm.  CTA NECK:  1. Normal CTA of the neck without evidence of high-grade flow-limiting stenosis, dissection, or other acute abnormality. 2. Dominant left vertebral artery.   Electronically Signed   By: Rise MuBenjamin  McClintock M.D.   On: 04/01/2013 02:56   Dg Chest 2 View  03/31/2013   CLINICAL DATA:  Intermittent abdominal pain  EXAM: CHEST  2 VIEW  COMPARISON:  CT ABD/PELV WO CM dated 06/04/2012; DG CHEST 1V PORT dated 03/02/2011  FINDINGS: Mildly enlarged cardiac silhouette. No  effusion, infiltrate, pneumothorax. No acute osseous abnormality.  IMPRESSION: No acute cardiopulmonary process.   Electronically Signed   By: Genevive Bi M.D.   On: 03/31/2013 23:57   Ct Head Wo Contrast  04/01/2013   CLINICAL DATA:  Chest pain, hypertension.  EXAM: CT HEAD WITHOUT CONTRAST  TECHNIQUE: Contiguous axial images were obtained from the base of the skull through the vertex without intravenous contrast.  COMPARISON:  CT HEAD W/O CM dated 11/25/2008  FINDINGS: No acute intracranial hemorrhage.  No focal mass lesion. No CT evidence of acute infarction. No midline shift or mass effect. No hydrocephalus. Basilar cisterns are patent. Paranasal sinuses and mastoid air cells are clear.  IMPRESSION: Normal head CT.   Electronically Signed   By: Genevive Bi M.D.   On: 04/01/2013 00:35   Ct Angio Neck W/cm &/or Wo/cm  04/01/2013   EXAM: CT ANGIOGRAPHY HEAD AND NECK  TECHNIQUE: Multidetector CT imaging of the head and neck was performed using the standard protocol during bolus administration of intravenous contrast. Multiplanar CT image reconstructions and MIPs were obtained to evaluate the vascular anatomy. Carotid stenosis measurements (when applicable) are obtained utilizing NASCET criteria, using the distal internal carotid diameter as the denominator.  CONTRAST:  OMNIPAQUE IOHEXOL 350 MG/ML SOLN  COMPARISON:  None.  FINDINGS: CTA HEAD FINDINGS  Anterior circulation: Normal appearance of the cervical internal carotid arteries, petrous, cavernous and supra clinoid internal carotid arteries. A few scattered atherosclerotic plaques noted within the cavernous segments of the internal carotid arteries bilaterally. Widely patent anterior communicating artery. The right A1 segment is hypoplastic. Normal appearance of the anterior and middle cerebral arteries.  Posterior circulation: Left vertebral artery is dominant, with normal appearance of the vertebral arteries, vertebrobasilar junction and basilar artery, as well as main branch vessels. Basilar artery is tortuous. Small bilateral posterior communicating arteries are present. Normal appearance of posterior cerebral arteries.  No large vessel occlusion, hemodynamically significant stenosis, dissection, luminal irregularity, contrast extravasation or aneurysm within the anterior nor posterior circulation.  Review of the MIP images confirms the above findings.  CTA NECK FINDINGS  The visualized intrathoracic aorta is of normal caliber. Incidental note  made of a bovine arch. Visualized subclavian arteries are widely patent bilaterally. No high-grade stenosis at the origin of the great vessels.  The common carotid arteries are well opacified bilaterally without evidence of dissection or high-grade flow-limiting stenosis. Carotid bifurcations are normal. The internal carotid arteries are symmetric in well opacified bilaterally. No high-grade stenosis or carotid artery dissection.  Visualized external carotid arteries and their branches are within normal limits.  Both the vertebral arteries arise from the subclavian arteries. The left vertebral artery is dominant. No high-grade flow-limiting stenosis, dissection, or other abnormality identified within the vertebral arteries.  Review of the MIP images confirms the above findings.  Visualized soft tissues of the neck are within normal limits. Visualized lungs are clear.  IMPRESSION: CTA HEAD:  1. Normal CTA of the head without evidence of high-grade flow-limiting stenosis, proximal branch occlusion, or intracranial aneurysm.  CTA NECK:  1. Normal CTA of the neck without evidence of high-grade flow-limiting stenosis, dissection, or other acute abnormality. 2. Dominant left vertebral artery.   Electronically Signed   By: Rise Mu M.D.   On: 04/01/2013 02:56   Mr Brain Wo Contrast  04/01/2013   CLINICAL DATA:  Right-sided weakness and left facial numbness.  EXAM: MRI HEAD WITHOUT CONTRAST  MRA HEAD WITHOUT CONTRAST  TECHNIQUE: Multiplanar, multiecho pulse sequences of  the brain and surrounding structures were obtained without intravenous contrast. Angiographic images of the head were obtained using MRA technique without contrast.  COMPARISON:  Head CT same day.  FINDINGS: MRI HEAD FINDINGS  Diffusion imaging shows a linear focus of acute infarction in the anteromedial temporal lobe on the right. No other acute infarction. The brainstem and cerebellum are normal. The cerebral hemispheres are otherwise  normal. No other old or acute insult. No mass lesion, hemorrhage, hydrocephalus or extra-axial collection. No pituitary mass. No inflammatory sinus disease.  MRA HEAD FINDINGS  Both internal carotid arteries are widely patent into the brain. No siphon stenosis. The right internal carotid artery supplies the right middle cerebral artery in the right posterior cerebral artery. The left internal carotid artery supplies the left middle cerebral artery and both anterior cerebral arteries.  Both vertebral arteries are patent with the left being dominant. No basilar stenosis. Posterior circulation branch vessels appear patent and normal.  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 No swelling or hemorrhage.  Otherwise normal appearance the brain.  No vascular stenosis or occlusion.   Electronically Signed   By: Paulina Fusi M.D.   On: 04/01/2013 15:46   Mr Maxine Glenn Head/brain Wo Cm  04/01/2013   CLINICAL DATA:  Right-sided weakness and left facial numbness.  EXAM: MRI HEAD WITHOUT CONTRAST  MRA HEAD WITHOUT CONTRAST  TECHNIQUE: Multiplanar, multiecho pulse sequences of the brain and surrounding structures were obtained without intravenous contrast. Angiographic images of the head were obtained using MRA technique without contrast.  COMPARISON:  Head CT same day.  FINDINGS: MRI HEAD FINDINGS  Diffusion imaging shows a linear focus of acute infarction in the anteromedial temporal lobe on the right. No other acute infarction. The brainstem and cerebellum are normal. The cerebral hemispheres are otherwise normal. No other old or acute insult. No mass lesion, hemorrhage, hydrocephalus or extra-axial collection. No pituitary mass. No inflammatory sinus disease.  MRA HEAD FINDINGS  Both internal carotid arteries are widely patent into the brain. No siphon stenosis. The right internal carotid artery supplies the right middle cerebral artery in the right posterior cerebral artery. The  left internal carotid artery supplies the left middle cerebral artery and both anterior cerebral arteries.  Both vertebral arteries are patent with the left being dominant. No basilar stenosis. Posterior circulation branch vessels appear patent and normal.  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 No swelling or hemorrhage.  Otherwise normal appearance the brain.  No vascular stenosis or occlusion.   Electronically Signed   By: Paulina Fusi M.D.   On: 04/01/2013 15:46   Ct Angio Chest Aorta W/cm &/or Wo/cm  04/01/2013   CLINICAL DATA:  Left-sided chest pain. Left-sided weakness and numbness. Lethargy.  EXAM: CT ANGIOGRAPHY CHEST WITH CONTRAST  TECHNIQUE: Multidetector CT imaging of the chest was performed using the standard protocol during bolus administration of intravenous contrast. Multiplanar CT image reconstructions and MIPs were obtained to evaluate the vascular anatomy.  CONTRAST:  OMNIPAQUE IOHEXOL 350 MG/ML IV.  COMPARISON:  DG CHEST 2 VIEW dated 03/31/2013; DG CHEST 1V PORT dated 03/02/2011; DG CHEST 2 VIEW dated 11/25/2008  FINDINGS: Unenhanced images demonstrate no evidence of mural hematoma in the thoracic her of abdominal aorta. Calcified plaque is present in the distal thoracic aorta.  Enhanced images demonstrate no evidence of thoracic or upper abdominal aortic aneurysm or dissection. Mild atherosclerosis involving the thoracic aorta with predominantly noncalcified  plaque. Bovine aortic arch anatomy is present (left common carotid artery arises from the innominate artery). No visible atherosclerosis involving the proximal great vessels.  Mild LAD coronary atherosclerosis. Cardiomegaly with left ventricular hypertrophy. No pericardial effusion. Central pulmonary arteries patent.  No significant mediastinal, hilar, or axillary lymphadenopathy. Visualized thyroid gland unremarkable.  Expected dependent atelectasis posteriorly in the lower lobes.  Pulmonary parenchyma clear without localized airspace consolidation, interstitial disease, or parenchymal nodules or masses. Central airways patent without significant bronchial wall thickening.  Visualized upper abdomen unremarkable for the early arterial phase of enhancement. Bone window images unremarkable.  Review of the MIP images confirms the above findings.  IMPRESSION: 1. No evidence of thoracic aortic aneurysm or dissection. Mild atherosclerosis involving the mid and distal thoracic aorta with predominantly noncalcified plaque. 2. Bovine aortic arch anatomy. No visible atherosclerosis involving the proximal great vessels. 3. Cardiomegaly with left ventricular hypertrophy and mild LAD coronary atherosclerosis. 4.  No acute cardiopulmonary disease.   Electronically Signed   By: Hulan Saas M.D.   On: 04/01/2013 02:57    Scheduled Meds: . amLODipine  10 mg Oral Daily  . enoxaparin (LOVENOX) injection  40 mg Subcutaneous Q24H  . research study medication  100 mg Oral Daily  . research study medication  90 mg Oral BID  . simvastatin  20 mg Oral q1800  . [START ON 04/03/2013] triamterene-hydrochlorothiazide  1 capsule Oral Daily   Continuous Infusions:    Active Problems:   Chest pain   Encephalopathy   Hypertensive urgency   Right sided weakness   CKD (chronic kidney disease) stage 3, GFR 30-59 ml/min   Headache(784.0)    Time spent: 35 minutes.     Hartley Barefoot A  [facility] Pager [phone]. If 7PM-7AM, please contact night-coverage at [email or web address], password [facility] 04/02/2013, 3:51 PM  LOS: 2 days

## 2013-04-03 NOTE — Progress Notes (Signed)
[facility] PROGRESS NOTE  Kevin Jefferson TMA:[id number] DOB: [birth year] DOA: 03/31/2013 PCP: No primary provider on file.  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 Right-sided hemiparesis, improving. Etiology is not clear. . YBW:LSLHTD acute infarction within the antero medial temporal lobe on the right. No swelling or hemorrhage. SKA:JGOT internal carotid arteries are widely patent into the brain Doppler;: Bilateral: 1-39% ICA stenosis. Vertebral artery flow is antegrade.  ECHO no source of thrombus.  TEE today.  LDL 140; Simvastatin daily.  Vasculitis panel: ANA negative, ESR 15, RPR, HIV negative, C 3 normal,   2-Encephalopathy; Improved.  Possibly related to a hypertensive encephalopathy. Due to concern for underlying CVA, we will pursue further workup including MRI/MRA of the head CT angiogram of the head and neck and chest were unremarkable for acute findings  3-Chest pain. Patient had cardiac catheterization done in 2010 Which showed nonobstructive coronary disease. . I suspect his pain is likely musculoskeletal in origin since it is reproducible. Troponin times 2 negative. ECHO; Ef 65 %.   4-Headache. Likely related to hypertension. Continue to monitor. Resolved.   5-Chronic kidney disease stage III.Marland Kitchen Baseline creatinine is around 1.4-1.5. Improved with IV fluids. Cr Peak to 1.7. Cr decrease to 1.4.   6-Malignant HTN; continue with Norvasc. Discontinue PRN Hydralazine , patient had episode of SOB. Will resume diuretic today. PRN labetalol. Renal function stable.    Code Status: full Code.  Family Communication: Care discussed with patient.  Disposition Plan: remain in hospital. CIR consulted.    Consultants:  Neurology  Procedures: ECHO;Left ventricle: The cavity size was normal. There was mild focal basal hypertrophy of the septum. Systolic function was normal. The estimated ejection fraction was in  the range of 60% to 65%. Wall motion was normal; there were no regional wall motion abnormalities. - Atrial septum: No defect or patent foramen ovale was identified.  Doppler; Preliminary findings: Bilateral: 1-39% ICA stenosis. Vertebral artery flow is antegrade.     Antibiotics:  none  HPI/Subjective: Feeling better, no dyspnea.   Objective: Filed Vitals:   04/03/13 1400  BP: 161/87  Pulse: 74  Temp:   Resp: 25    Intake/Output Summary (Last 24 hours) at 04/03/13 1420 Last data filed at 04/03/13 1417  Gross per 24 hour  Intake      0 ml  Output    300 ml  Net   -300 ml   Filed Weights   04/01/13 0600  Weight: 88.451 kg (195 lb)    Exam:   General:  No distress.   Cardiovascular: S 1, S 2 RRR  Respiratory: CTA  Abdomen: Bs, NT, ND  Musculoskeletal: trace edema.   Neuro; motor strengh 5/5, speech clear.   Data Reviewed: Basic Metabolic Panel:  Recent Labs Lab 03/31/13 2300 04/01/13 0745 04/02/13 1303  NA 140 140 140  K 3.4* 4.0 3.9  CL 98 101 103  CO2 _0 GLUCOSE 110* 99 90  BUN _1 CREATININE 1.70* 1.49* 1.42*  CALCIUM 9.6 9.0 9.3   Liver Function Tests:  Recent Labs Lab 03/31/13 2300  AST 25  ALT 43  ALKPHOS 26*  BILITOT 0.5  PROT 7.6  ALBUMIN 4.0   No results found for this basename: LIPASE, AMYLASE,  in the last 168 hours No results found for this basename: AMMONIA,  in the last 168 hours CBC:  Recent Labs Lab 03/31/13 2300 04/01/13 0745  WBC 8.1 7.0  NEUTROABS  4.5  --   HGB 12.3* 11.8*  HCT 36.4* 34.5*  MCV 87.3 85.6  PLT 192 175   Cardiac Enzymes:  Recent Labs Lab 03/31/13 2300 04/01/13 0745 04/01/13 1345 04/01/13 1827  TROPONINI <0.30 <0.30 <0.30 <0.30   BNP (last 3 results) No results found for this basename: PROBNP,  in the last 8760 hours CBG: No results found for this basename: GLUCAP,  in the last 168 hours  No results found for this or any previous visit (from the past 240  hour(s)).   Studies: Mr Brain Wo Contrast  04/01/2013   CLINICAL DATA:  Right-sided weakness and left facial numbness.  EXAM: MRI HEAD WITHOUT CONTRAST  MRA HEAD WITHOUT CONTRAST  TECHNIQUE: Multiplanar, multiecho pulse sequences of the brain and surrounding structures were obtained without intravenous contrast. Angiographic images of the head were obtained using MRA technique without contrast.  COMPARISON:  Head CT same day.  FINDINGS: MRI HEAD FINDINGS  Diffusion imaging shows a linear focus of acute infarction in the anteromedial temporal lobe on the right. No other acute infarction. The brainstem and cerebellum are normal. The cerebral hemispheres are otherwise normal. No other old or acute insult. No mass lesion, hemorrhage, hydrocephalus or extra-axial collection. No pituitary mass. No inflammatory sinus disease.  MRA HEAD FINDINGS  Both internal carotid arteries are widely patent into the brain. No siphon stenosis. The right internal carotid artery supplies the right middle cerebral artery in the right posterior cerebral artery. The left internal carotid artery supplies the left middle cerebral artery and both anterior cerebral arteries.  Both vertebral arteries are patent with the left being dominant. No basilar stenosis. Posterior circulation branch vessels appear patent and normal.  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 No swelling or hemorrhage.  Otherwise normal appearance the brain.  No vascular stenosis or occlusion.   Electronically Signed   By: Nelson Chimes M.D.   On: 04/01/2013 15:46   Mr Jodene Nam Head/brain Wo Cm  04/01/2013   CLINICAL DATA:  Right-sided weakness and left facial numbness.  EXAM: MRI HEAD WITHOUT CONTRAST  MRA HEAD WITHOUT CONTRAST  TECHNIQUE: Multiplanar, multiecho pulse sequences of the brain and surrounding structures were obtained without intravenous contrast. Angiographic images of the head were obtained using MRA technique  without contrast.  COMPARISON:  Head CT same day.  FINDINGS: MRI HEAD FINDINGS  Diffusion imaging shows a linear focus of acute infarction in the anteromedial temporal lobe on the right. No other acute infarction. The brainstem and cerebellum are normal. The cerebral hemispheres are otherwise normal. No other old or acute insult. No mass lesion, hemorrhage, hydrocephalus or extra-axial collection. No pituitary mass. No inflammatory sinus disease.  MRA HEAD FINDINGS  Both internal carotid arteries are widely patent into the brain. No siphon stenosis. The right internal carotid artery supplies the right middle cerebral artery in the right posterior cerebral artery. The left internal carotid artery supplies the left middle cerebral artery and both anterior cerebral arteries.  Both vertebral arteries are patent with the left being dominant. No basilar stenosis. Posterior circulation branch vessels appear patent and normal.  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 No swelling or hemorrhage.  Otherwise normal appearance the brain.  No vascular stenosis or occlusion.   Electronically Signed   By: Nelson Chimes M.D.   On: 04/01/2013 15:46    Scheduled Meds: . [MAR HOLD] amLODipine  10 mg Oral Daily  . [  MAR HOLD] enoxaparin (LOVENOX) injection  40 mg Subcutaneous Q24H  . [facility] HOLD] research study medication  100 mg Oral Daily  . [facility] HOLD] research study medication  90 mg Oral BID  . [facility] HOLD] simvastatin  20 mg Oral q1800  . [facility] HOLD] triamterene-hydrochlorothiazide  1 capsule Oral Daily   Continuous Infusions: . sodium chloride 20 mL/hr at 04/03/13 1236    Active Problems:   Chest pain   Encephalopathy   Hypertensive urgency   Right sided weakness   CKD (chronic kidney disease) stage 3, GFR 30-59 ml/min   Headache(784.0)    Time spent: 25 minutes.     Niel Hummer A  [facility] Pager [phone]. If 7PM-7AM, please contact  night-coverage at [email or web address], password [facility] 04/03/2013, 2:20 PM  LOS: 3 days

## 2013-04-03 NOTE — Interval H&P Note (Signed)
History and Physical Interval Note:  04/03/2013 1:04 PM  Kevin Jefferson  has presented today for surgery, with the diagnosis of STROKE  The various methods of treatment have been discussed with the patient and family. After consideration of risks, benefits and other options for treatment, the patient has consented to  Procedure(s): TRANSESOPHAGEAL ECHOCARDIOGRAM (TEE) (N/A) as a surgical intervention .  The patient's history has been reviewed, patient examined, no change in status, stable for surgery.  I have reviewed the patient's chart and labs.  Questions were answered to the patient's satisfaction.     Olga MillersBrian Travon Crochet

## 2013-04-03 NOTE — Clinical Documentation Improvement (Signed)
  Patient with acute CVA initially presenting with "weakness right arm and leg". In the Coding world the term "weakness" is considered nonspecific and low weighted. If possible, please clarify this term to show severity of illness and risk of mortality. Thank you.  Possible Clinical Conditions? - right hemiparesis improving  - resolving right hemiparesis  - initial right hemiparesis  - other condition (please specify)  Thank You, Beverley FiedlerLaurie E Ahsley Attwood ,RN Clinical Documentation Specialist:  [phone]  [facility]- Health Information Management

## 2013-04-03 NOTE — Progress Notes (Addendum)
Physical Therapy Treatment Patient Details Name: Kevin BonusJames Bera MRN: [id number] DOB: [birth year] Today's Date: 04/03/2013    History of Present Illness 48 y.o. [facility] with chest pain and s/s of stroke.  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  Pt's function and his MMT have been inconsistant per PT/OT/      PT Comments    Pt continues to have inconsistancies with his functional mobility and his strength and coordination testing seated on the EOB.  However, based on his demonstrated functional ability he would benefit from continued therapy prior to going home.  He does not have 24/7 assist and I do believe with continued therapy he could get to mod I level of care.    Follow Up Recommendations  CIR     Equipment Recommendations  Rolling walker with 5" wheels    Recommendations for Other Services Rehab consult     Precautions / Restrictions Precautions Precautions: Fall Precaution Comments: generalized weakness and reliance on RW when on his feet.     Mobility  Bed Mobility Overal bed mobility: Modified Independent Bed Mobility: Supine to Sit     Supine to sit: Independent     General bed mobility comments: Pt has very good abdominal strength as he is leaning backwards to reach his phone on the opposite side of the bed from sitting wihtout upper extremity support.  He moved easily and quickly to the EOB which is different than his speed of movement walking or when doing formal coordination testing.   Transfers Overall transfer level: Needs assistance Equipment used: Rolling walker (2 wheeled);None Transfers: Sit to/from Stand Sit to Stand: Min guard         General transfer comment: min guard assist for safety.  I let pt do what he could on his own.  Verbal cues for safe hand placement.    Ambulation/Gait Ambulation/Gait assistance: Min assist Ambulation Distance (Feet): 20 Feet Assistive  device: Rolling walker (2 wheeled) Gait Pattern/deviations: Step-to pattern;Shuffle;Trunk flexed (knees flexed. ) Gait velocity: decreased Gait velocity interpretation: Below normal speed for age/gender General Gait Details: Pt again with heavy reliance on upper extremity support on RW.  Pt demonstrated 4/5 strength in his arms during MMT EOB, but now walking he demonstrates 5/5 strength as he is supporting his body with his hands and arms.  He demonstrated 4/5 bil generally weak leg strength, but he demonstrated weaker leg strength with gait.  He reports today that his left leg was weaker and numb.  Pt was able to get off of very low toilet without assistance.        Modified Rankin (Stroke Patients Only) Modified Rankin (Stroke Patients Only) Pre-Morbid Rankin Score: No symptoms Modified Rankin: Moderately severe disability     Balance Overall balance assessment: Needs assistance Sitting-balance support: No upper extremity supported;Feet unsupported;Feet supported Sitting balance-Leahy Scale: Normal Sitting balance - Comments: pt donned socks quickly and easily on both left and right foot.  This is inconsistant with upper and lower extremity strength and coordination testing.     Standing balance support: Bilateral upper extremity supported Standing balance-Leahy Scale: Poor                      Cognition Arousal/Alertness: Awake/alert Behavior During Therapy: WFL for tasks assessed/performed Overall Cognitive Status: Within Functional Limits for tasks assessed  Pertinent Vitals/Pain See vitals flow sheet.            PT Goals (current goals can now be found in the care plan section) Acute Rehab PT Goals Patient Stated Goal: Get home and back to work Progress towards PT goals: Progressing toward goals    Frequency  Min 3X/week    PT Plan Current plan remains appropriate    End of Session   Activity Tolerance: Patient  limited by fatigue Patient left: in chair;with call bell/phone within reach     Time: 8469-6295 PT Time Calculation (min): 32 min  Charges:  $Gait Training: 8-22 mins $Therapeutic Activity: 8-22 mins                      Icker Swigert B. Nataline Basara, PT, DPT [phone]   04/03/2013, 5:51 PM

## 2013-04-03 NOTE — Consult Note (Signed)
Physical Medicine and Rehabilitation Consult  Reason for Consult: Left facial numbness, UE weakness, difficulty walking Referring Physician: Dr. Pearlean Brownie.    HPI: Kevin Jefferson is a 48 y.o. male with history of HTN who was admitted on 04/01/13 with chest pain, N/V dizziness as well as  malaise. Patient with lethargy as well as complaints of HA and non-compliant with medications--BP 197/119. He was transferred to [facility] for workup and CCT negative. CTA neck without evidence of high grade stenosis or dissection. MRI/MRA of brain with linear acute infarct within antero medial temporal lobe on the right and no swelling or hemorrhage. Swallow evaluation without evidence of dysphagia. 2D echo with EF 60-65% and no source of emboli. Carotid dopplers without evidence of significant stenosis. Cardiac enzymes negative. UDS negative. Patient with mild RUE>LUE weakness with poor efffort. Neurology suspects embolic brainstem stroke not seen on MRI and TEE ordered for work up. Patient continues to have poorly controlled BP as well as lethargy that's resolving. Therapy evaluation done yesterday and MD/rehab team recommending CIR.    Review of Systems  HENT: Negative for hearing loss.   Eyes: Negative for blurred vision and double vision.  Respiratory: Negative for cough, sputum production and shortness of breath.   Cardiovascular: Negative for chest pain, palpitations and orthopnea.  Gastrointestinal: Negative for heartburn, nausea and vomiting.  Genitourinary: Negative for dysuria and frequency.  Musculoskeletal: Negative for back pain, myalgias and neck pain.  Neurological: Positive for dizziness, sensory change and focal weakness. Negative for headaches.  Psychiatric/Behavioral: Positive for suicidal ideas. The patient is nervous/anxious.     Past Medical History  Diagnosis Date  . Hypertension    History reviewed. No pertinent past surgical history.  Family History  Problem Relation Age of Onset    . Hypertension Mother   . Hypertension Father   . Irregular heart beat Maternal Grandmother     Social History:  Married. Works as a Naval architect. Wife works full time.  He reports that he has never smoked. He does not have any smokeless tobacco history on file. He reports that he does not drink alcohol or use illicit drugs.   Allergies  Allergen Reactions  . Hydralazine Hcl     SOB ?    Medications Prior to Admission  Medication Sig Dispense Refill  . acetaminophen (TYLENOL) 500 MG tablet Take 1,500 mg by mouth every 6 (six) hours as needed for moderate pain or headache.      Marland Kitchen amLODipine (NORVASC) 10 MG tablet Take 10 mg by mouth daily.      Marland Kitchen triamterene-hydrochlorothiazide (DYAZIDE) 37.5-25 MG per capsule Take 1 capsule by mouth daily.         Home: Home Living Family/patient expects to be discharged to:: Private residence Living Arrangements: Spouse/significant other Available Help at Discharge: Available PRN/intermittently Type of Home: House Home Access: Stairs to enter Secretary/administrator of Steps: 3 Entrance Stairs-Rails: Right;Left Home Layout: One level Home Equipment: Grab bars - tub/shower  Functional History: Prior Function Level of Independence: Independent Comments: shorter distance truck driver Functional Status:  Mobility: Bed Mobility Overal bed mobility: Needs Assistance Bed Mobility: Supine to Sit;Sit to Supine Supine to sit: Supervision Sit to supine: Supervision General bed mobility comments: appears to require increased effort Transfers Overall transfer level: Needs assistance Equipment used: Rolling walker (2 wheeled);None Transfers: Sit to/from Stand Sit to Stand: Mod assist General transfer comment: cuing for sequencing, steady assist until he could bring he Leg strength to bear. Ambulation/Gait  Ambulation/Gait assistance: Mod assist Ambulation Distance (Feet): 30 Feet Assistive device: Rolling walker (2 wheeled) Gait  Pattern/deviations: Step-through pattern Gait velocity interpretation: Below normal speed for age/gender General Gait Details: Heavy use of UE into RW. Variable gait pattern and gait speed, but not age appropriate, labored    ADL:    Cognition: Cognition Overall Cognitive Status: Within Functional Limits for tasks assessed Orientation Level: Oriented X4 Cognition Arousal/Alertness: Awake/alert Behavior During Therapy: WFL for tasks assessed/performed Overall Cognitive Status: Within Functional Limits for tasks assessed  Blood pressure 156/86, pulse 68, temperature 98.7 F (37.1 C), temperature source Oral, resp. rate 16, height 5\' 11"  (1.803 m), weight 88.451 kg (195 lb), SpO2 100.00%. Physical Exam  Nursing note and vitals reviewed. Constitutional: He is oriented to person, place, and time. He appears well-developed and well-nourished.  HENT:  Head: Normocephalic and atraumatic.  Eyes: Conjunctivae are normal. Pupils are equal, round, and reactive to light.  Neck: Normal range of motion. Neck supple.  Cardiovascular: Normal rate and regular rhythm.   Respiratory: Effort normal and breath sounds normal.  GI: Soft. Bowel sounds are normal. He exhibits no distension. There is no tenderness.  Musculoskeletal: He exhibits no edema and no tenderness.  Neurological: He is alert and oriented to person, place, and time.  Speech clear.  RUE ataxia with finger to nose greater than left limb ataxia. Decreased PP/LT over left face and LLE.  Few beats nystagmus to left lateral field.  Strength grossly 4/5 in all 4 limbs. Good insight and awareness. Asked appropriate questions  Skin: Skin is warm and dry.  Psychiatric: He has a normal mood and affect. His behavior is normal. Thought content normal.    Results for orders placed during the hospital encounter of 03/31/13 (from the past 24 hour(s))  BASIC METABOLIC PANEL     Status: Abnormal   Collection Time    04/02/13  1:03 PM      Result  Value Ref Range   Sodium 140  137 - 147 mEq/L   Potassium 3.9  3.7 - 5.3 mEq/L   Chloride 103  96 - 112 mEq/L   CO2 26  19 - 32 mEq/L   Glucose, Bld 90  70 - 99 mg/dL   BUN 10  6 - 23 mg/dL   Creatinine, Ser 1.61 (*) 0.50 - 1.35 mg/dL   Calcium 9.3  8.4 - 09.6 mg/dL   GFR calc non Af Amer 57 (*) >90 mL/min   GFR calc Af Amer 66 (*) >90 mL/min  C3 COMPLEMENT     Status: None   Collection Time    04/02/13  1:03 PM      Result Value Ref Range   C3 Complement 128  90 - 180 mg/dL  C4 COMPLEMENT     Status: None   Collection Time    04/02/13  1:03 PM      Result Value Ref Range   Complement C4, Body Fluid 21  10 - 40 mg/dL  SEDIMENTATION RATE     Status: None   Collection Time    04/02/13  1:03 PM      Result Value Ref Range   Sed Rate 15  0 - 16 mm/hr  RPR     Status: None   Collection Time    04/02/13  1:03 PM      Result Value Ref Range   RPR NON REACTIVE  NON REACTIVE  HIV ANTIBODY (ROUTINE TESTING)     Status: None  Collection Time    04/02/13  1:03 PM      Result Value Ref Range   HIV NON REACTIVE  NON REACTIVE  ANTITHROMBIN III     Status: None   Collection Time    04/02/13  1:03 PM      Result Value Ref Range   AntiThromb III Func 91  75 - 120 %   Mr Brain Wo Contrast  04/01/2013   CLINICAL DATA:  Right-sided weakness and left facial numbness.  EXAM: MRI HEAD WITHOUT CONTRAST  MRA HEAD WITHOUT CONTRAST  TECHNIQUE: Multiplanar, multiecho pulse sequences of the brain and surrounding structures were obtained without intravenous contrast. Angiographic images of the head were obtained using MRA technique without contrast.  COMPARISON:  Head CT same day.  FINDINGS: MRI HEAD FINDINGS  Diffusion imaging shows a linear focus of acute infarction in the anteromedial temporal lobe on the right. No other acute infarction. The brainstem and cerebellum are normal. The cerebral hemispheres are otherwise normal. No other old or acute insult. No mass lesion, hemorrhage, hydrocephalus or  extra-axial collection. No pituitary mass. No inflammatory sinus disease.  MRA HEAD FINDINGS  Both internal carotid arteries are widely patent into the brain. No siphon stenosis. The right internal carotid artery supplies the right middle cerebral artery in the right posterior cerebral artery. The left internal carotid artery supplies the left middle cerebral artery and both anterior cerebral arteries.  Both vertebral arteries are patent with the left being dominant. No basilar stenosis. Posterior circulation branch vessels appear patent and normal.  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 No swelling or hemorrhage.  Otherwise normal appearance the brain.  No vascular stenosis or occlusion.   Electronically Signed   By: Paulina Fusi M.D.   On: 04/01/2013 15:46   Mr Maxine Glenn Head/brain Wo Cm  04/01/2013   CLINICAL DATA:  Right-sided weakness and left facial numbness.  EXAM: MRI HEAD WITHOUT CONTRAST  MRA HEAD WITHOUT CONTRAST  TECHNIQUE: Multiplanar, multiecho pulse sequences of the brain and surrounding structures were obtained without intravenous contrast. Angiographic images of the head were obtained using MRA technique without contrast.  COMPARISON:  Head CT same day.  FINDINGS: MRI HEAD FINDINGS  Diffusion imaging shows a linear focus of acute infarction in the anteromedial temporal lobe on the right. No other acute infarction. The brainstem and cerebellum are normal. The cerebral hemispheres are otherwise normal. No other old or acute insult. No mass lesion, hemorrhage, hydrocephalus or extra-axial collection. No pituitary mass. No inflammatory sinus disease.  MRA HEAD FINDINGS  Both internal carotid arteries are widely patent into the brain. No siphon stenosis. The right internal carotid artery supplies the right middle cerebral artery in the right posterior cerebral artery. The left internal carotid artery supplies the left middle cerebral artery and both  anterior cerebral arteries.  Both vertebral arteries are patent with the left being dominant. No basilar stenosis. Posterior circulation branch vessels appear patent and normal.  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 No swelling or hemorrhage.  Otherwise normal appearance the brain.  No vascular stenosis or occlusion.   Electronically Signed   By: Paulina Fusi M.D.   On: 04/01/2013 15:46    Assessment/Plan: Diagnosis: right temporal lobe infarct, ? Small brainstem infarct 1. Does the need for close, 24 hr/day medical supervision in concert with the patient's rehab needs make it unreasonable for this patient to be served in a  less intensive setting? Yes 2. Co-Morbidities requiring supervision/potential complications: ckd, htn 3. Due to bladder management, bowel management, safety, skin/wound care, disease management, medication administration, pain management and patient education, does the patient require 24 hr/day rehab nursing? Yes 4. Does the patient require coordinated care of a physician, rehab nurse, PT (1-2 hrs/day, 5 days/week) and OT (1-2 hrs/day, 5 days/week) to address physical and functional deficits in the context of the above medical diagnosis(es)? Yes Addressing deficits in the following areas: balance, endurance, locomotion, strength, transferring, bowel/bladder control, bathing, dressing, feeding, grooming, toileting and psychosocial support 5. Can the patient actively participate in an intensive therapy program of at least 3 hrs of therapy per day at least 5 days per week? Yes 6. The potential for patient to make measurable gains while on inpatient rehab is excellent 7. Anticipated functional outcomes upon discharge from inpatient rehab are modified independent  with PT, modified independent with OT, n/a with SLP. 8. Estimated rehab length of stay to reach the above functional goals is: 8-11 days 9. Does the patient have adequate  social supports to accommodate these discharge functional goals? Yes 10. Anticipated D/C setting: Home 11. Anticipated post D/C treatments: HH therapy and Outpatient therapy 12. Overall Rehab/Functional Prognosis: excellent  RECOMMENDATIONS: This patient's condition is appropriate for continued rehabilitative care in the following setting: CIR Patient has agreed to participate in recommended program. Yes Note that insurance prior authorization may be required for reimbursement for recommended care.  Comment: Rehab Admissions Coordinator to follow up.  Thanks,  Ranelle OysterZachary T. Stancil Deisher, MD, [place] DomFAAPMR     04/03/2013

## 2013-04-03 NOTE — Progress Notes (Signed)
Echocardiogram Echocardiogram Transesophageal has been performed.  Rohini Jaroszewski 04/03/2013, 1:39 PM

## 2013-04-03 NOTE — Progress Notes (Signed)
Stroke Team Progress Note  HISTORY Kevin Jefferson is an 48 y.o. male with a history of hypertension and not compliant with taking medication regularly who developed acute onset of left-sided headache as well as dizziness and also complaining of photophobia. At one point he was nauseated and vomited. No clear focal deficits occurred. His wife described him as being in somewhat dazed. CT scan of his head showed no acute intracranial abnormality. Patient was transferred from [name] to [facility] in code stroke status for further management. Patient was somewhat drowsy but well oriented at the time of his arrival. No clear focal deficits were noted. NIH stroke score was 2 with reduced level of alertness and drift of left lower extremity, although effort was clearly poor. Laboratory studies were unremarkable with no signs of acute metabolic abnormality. Serum glucose was 110. Urine drug screen is pending Code stroke was subsequently canceled. CTA of head and neck was normal. Patient was last seen normal 04/01/2013 at 2030.   He was admitted for further evaluation and treatment.  SUBJECTIVE His wife is at the bedside.  Day shared about son who has clotting disorder.  OBJECTIVE Most recent Vital Signs: Filed Vitals:   04/02/13 2000 04/03/13 0004 04/03/13 0400 04/03/13 0737  BP: 152/85 129/70 158/93 156/86  Pulse: 85 73 76 68  Temp: 98 F (36.7 C) 97.8 F (36.6 C) 98.1 F (36.7 C) 98.7 F (37.1 C)  TempSrc:    Oral  Resp: $Remo'18 18 18 16  'Eveoq$ Height:      Weight:      SpO2: 100% 98% 99% 100%   CBG (last 3)  No results found for this basename: GLUCAP,  in the last 72 hours  IV Fluid Intake:     MEDICATIONS  . amLODipine  10 mg Oral Daily  . enoxaparin (LOVENOX) injection  40 mg Subcutaneous Q24H  . research study medication  100 mg Oral Daily  . research study medication  90 mg Oral BID  . simvastatin  20 mg Oral q1800  . triamterene-hydrochlorothiazide  1 capsule Oral Daily   PRN:  acetaminophen,  HYDROcodone-acetaminophen, metoprolol, ondansetron (ZOFRAN) IV, senna-docusate  Diet:  NPO thin liquids Activity:   DVT Prophylaxis:  Lovenox 40 mg sq daily   CLINICALLY SIGNIFICANT STUDIES Basic Metabolic Panel:   Recent Labs Lab 04/01/13 0745 04/02/13 1303  NA 140 140  K 4.0 3.9  CL 101 103  CO2 26 26  GLUCOSE 99 90  BUN 16 10  CREATININE 1.49* 1.42*  CALCIUM 9.0 9.3   Liver Function Tests:   Recent Labs Lab 03/31/13 2300  AST 25  ALT 43  ALKPHOS 26*  BILITOT 0.5  PROT 7.6  ALBUMIN 4.0   CBC:   Recent Labs Lab 03/31/13 2300 04/01/13 0745  WBC 8.1 7.0  NEUTROABS 4.5  --   HGB 12.3* 11.8*  HCT 36.4* 34.5*  MCV 87.3 85.6  PLT 192 175   Coagulation:   Recent Labs Lab 03/31/13 2300  LABPROT 13.0  INR 1.00   Cardiac Enzymes:   Recent Labs Lab 04/01/13 0745 04/01/13 1345 04/01/13 1827  TROPONINI <0.30 <0.30 <0.30   Urinalysis:   Recent Labs Lab 04/01/13 0120  COLORURINE YELLOW  LABSPEC 1.012  PHURINE 6.5  GLUCOSEU NEGATIVE  HGBUR NEGATIVE  BILIRUBINUR NEGATIVE  KETONESUR NEGATIVE  PROTEINUR NEGATIVE  UROBILINOGEN 0.2  NITRITE NEGATIVE  LEUKOCYTESUR TRACE*   Lipid Panel    Component Value Date/Time   CHOL 218* 04/02/2013 0643   TRIG 241* 04/02/2013  0643   HDL 30* 04/02/2013 0643   CHOLHDL 7.3 04/02/2013 0643   VLDL 48* 04/02/2013 0643   LDLCALC 140* 04/02/2013 0643   HgbA1C  Lab Results  Component Value Date   HGBA1C 5.5 04/02/2013    Urine Drug Screen:     Component Value Date/Time   LABOPIA NONE DETECTED 04/01/2013 0120   COCAINSCRNUR NONE DETECTED 04/01/2013 0120   LABBENZ NONE DETECTED 04/01/2013 0120   AMPHETMU NONE DETECTED 04/01/2013 0120   THCU NONE DETECTED 04/01/2013 0120   LABBARB NONE DETECTED 04/01/2013 0120    Alcohol Level: No results found for this basename: ETH,  in the last 168 hours  Hypercoagulable Workup Normal - C3, C4, RPR, ESR, HIV, Anti III, homocysteine Pending - CH50, ANA, Prot C activity, Protein C  total, Protein S activity, Protein S total, lupus anticoagulant, cardiolipin antibody, [id number], methylmalonic acid, factor V Leiden  CT of the brain  04/01/2013    Normal head CT.      CT Angio Head   04/01/2013   1. Normal CTA of the head without evidence of high-grade flow-limiting stenosis, proximal branch occlusion, or intracranial aneurysm.    CT Angio Neck  04/01/2013    1. Normal CTA of the neck without evidence of high-grade flow-limiting stenosis, dissection, or other acute abnormality. 2. Dominant left vertebral artery.    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 No swelling or hemorrhage.  Otherwise normal appearance the brain.    MRA of the brain  04/01/2013    No vascular stenosis or occlusion.    2D Echocardiogram  EF 60-65% with no source of embolus.   Carotid Doppler  No evidence of hemodynamically significant internal carotid artery stenosis. Vertebral artery flow is antegrade.   CXR  03/31/2013    No acute cardiopulmonary process.     CT Angio Chest Aorta  04/01/2013   1. No evidence of thoracic aortic aneurysm or dissection. Mild atherosclerosis involving the mid and distal thoracic aorta with predominantly noncalcified plaque. 2. Bovine aortic arch anatomy. No visible atherosclerosis involving the proximal great vessels. 3. Cardiomegaly with left ventricular hypertrophy and mild LAD coronary atherosclerosis. 4.  No acute cardiopulmonary disease.     EKG  normal sinus rhythm. For complete results please see formal report.   Therapy Recommendations CIR  Physical Exam   Middle-aged [place] American male currently not in distress.Awake alert. Afebrile. Head is nontraumatic. Neck is supple without bruit. Hearing is normal. Cardiac exam no murmur or gallop. Lungs are clear to auscultation. Distal pulses are well felt. Neurological Exam : Awake alert oriented x3 with normal speech and language function. Extraocular  movements are full range without nystagmus. Face is symmetric without weakness. Fundi were not visualized. Vision acuity seems adequate. Tongue is midline. Motor system exam no upper or lower extremity drift. Mild grip weakness right greater than left but effort is poor. No lower extremity weakness. Subjective decreased sensation on the left face and arm but not the leg. Splits the midline. Coordination is slow bilateral but accurate. Gait not tested.  ASSESSMENT Mr. Alecsander Hattabaugh is a 48 y.o. male presenting with Headache, dizziness and photophobia as well as lethargy.  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 Suspect brainstem stroke not seen on MRI. Infarct felt to be embolic secondary to unknown source.  On no antithrombotics prior to admission. Now Enrolled in [employer] Trial  for secondary stroke prevention. Stroke work up underway. There is concern for exacerbation of symptoms, nonorganic embellishment.  Malignant Hypertension, 197/119 on arrival  Hyperlipidemia, LDL 140, on no statin PTA, now on Zocor 20 mg daily, goal LDL < 100  Chest pain, thought to be musculoskeletal Headache, resolved Chronic kidney disease stage III Son with hx of "clots in his legs and blood", dx methylmalonic acidemia. Could possibly be familial.  Hospital day # 3  TREATMENT/PLAN  Continue SOCRATES for secondary stroke prevention, comparison of 90-day treatment with ticagrelor vs aspirin for the prevention of major vascular events in patients with acute ischaemic stroke or TIA. TEE to look for embolic source. Arranged with [place] for today.  If positive for PFO (patent foramen ovale), check bilateral lower extremity venous dopplers to rule out DVT as possible source of stroke.  Rehab consult F/u Hypercoagulable panel  Check methylmalonic acid, hypercoagulable labs associated with venous clots  Burnetta Sabin, MSN, RN,  ANVP-BC, AGPCNP-BC [facility] Pager: [phone] 04/03/2013 10:02 AM  I have personally obtained a history, examined the patient, evaluated imaging results, and formulated the assessment and plan of care. I agree with the above.  Antony Contras, MD   To contact Stroke Continuity provider, please refer to [email or web address] After hours, contact General Neurology

## 2013-04-03 NOTE — CV Procedure (Signed)
See full TEE report in [employer]; normal LV function; trace MR and TR; mild atherosclerosis descending aorta. Olga MillersBrian Carolyne Whitsel

## 2013-04-04 ENCOUNTER — Encounter (HOSPITAL_COMMUNITY): Payer: Self-pay | Admitting: Cardiology

## 2013-04-04 DIAGNOSIS — I6350 Cerebral infarction due to unspecified occlusion or stenosis of unspecified cerebral artery: Secondary | ICD-10-CM

## 2013-04-04 DIAGNOSIS — R071 Chest pain on breathing: Secondary | ICD-10-CM

## 2013-04-04 LAB — PROTEIN S, TOTAL: Protein S Ag, Total: 91 % (ref 60–150)

## 2013-04-04 LAB — PROTEIN C, TOTAL: Protein C, Total: 103 % (ref 72–160)

## 2013-04-04 LAB — COMPLEMENT, TOTAL: Compl, Total (CH50): 60 U/mL (ref 31–60)

## 2013-04-04 LAB — LIPID PANEL
Cholesterol: 247 mg/dL — ABNORMAL HIGH (ref 0–200)
HDL: 31 mg/dL — ABNORMAL LOW (ref >39)
LDL CALC: 153 mg/dL — AB (ref 0–99)
TRIGLYCERIDES: 313 mg/dL — AB (ref <150)
Total CHOL/HDL Ratio: 8 RATIO
VLDL: 63 mg/dL — AB (ref 0–40)

## 2013-04-04 LAB — HOMOCYSTEINE: Homocysteine: 13.2 umol/L (ref 4.0–15.4)

## 2013-04-04 LAB — FACTOR 5 LEIDEN

## 2013-04-04 LAB — PROTEIN C ACTIVITY: Protein C Activity: 168 % — ABNORMAL HIGH (ref 75–133)

## 2013-04-04 LAB — PROTEIN S ACTIVITY: Protein S Activity: 102 % (ref 69–129)

## 2013-04-04 MED ORDER — STUDY - INVESTIGATIONAL DRUG SIMPLE RECORD: 90.0000 mg | Freq: Two times a day (BID) | Status: DC

## 2013-04-04 MED ORDER — SIMVASTATIN 20 MG PO TABS: 20.0000 mg | ORAL_TABLET | Freq: Every evening | ORAL | Status: DC

## 2013-04-04 MED ORDER — STUDY - INVESTIGATIONAL DRUG SIMPLE RECORD: 100.0000 mg | Freq: Every day | Status: DC

## 2013-04-04 NOTE — Progress Notes (Signed)
Physical Therapy Treatment Patient Details Name: Kevin Jefferson MRN: [id number] DOB: August 04, 1965 Today's Date: 04/04/2013    History of Present Illness 48 y.o. male admitted to [facility] on 04/03/13 with chest pain and s/s of stroke.  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  Pt's function and his MMT have been inconsistant per PT/OT/      PT Comments    Pt with inconsistencies throughout treatment session today. C/o Rt LE weakness throughout ambulation; however, with bed mobility and sit to stand from low toilet pt was mod I and independent. Pt at supervision level for ambulation with RW; limited distance due to fatigue. Believe pt is high enough level to D/C home with wife/family and ambulate with RW. Spoke with rehab admission coordinator and RN regarding D/C recommendation update.   Follow Up Recommendations  Home health PT;Supervision/Assistance - 24 hour     Equipment Recommendations  Rolling walker with 5" wheels    Recommendations for Other Services       Precautions / Restrictions Precautions Precautions: Fall Precaution Comments: inconsistancy in weakness Restrictions Weight Bearing Restrictions: No    Mobility  Bed Mobility Overal bed mobility: Independent Bed Mobility: Supine to Sit           General bed mobility comments: moves LEs with ease to EOB; inconsistant muscle strength as demo'd in ambulation   Transfers Overall transfer level: Needs assistance Equipment used: Rolling walker (2 wheeled);None Transfers: Sit to/from Stand Sit to Stand: Supervision         General transfer comment: pt with supervision (A) only for cues for RW safety; pt able to bring himself to standing without use of LEs from lower surface toilet   Ambulation/Gait Ambulation/Gait assistance: Supervision Ambulation Distance (Feet): 50 Feet Assistive device: Rolling walker (2 wheeled) Gait Pattern/deviations: Trunk flexed;Decreased stride  length;Step-through pattern Gait velocity: decreased; effortful  Gait velocity interpretation: Below normal speed for age/gender General Gait Details: pt continues to demo heavily reliance on RW for gt; will increase gt speed when distracted in conversation; demo'd weaker LE strength as compared to bed mobility and with his sit to stand from toilet; today c/o Rt LE weakness and demo difficulty advance Rt LE at times; supervision for safety only                    Modified Rankin (Stroke Patients Only) Modified Rankin (Stroke Patients Only) Pre-Morbid Rankin Score: No symptoms Modified Rankin: Moderately severe disability     Balance Overall balance assessment: Needs assistance Sitting-balance support: Feet supported;No upper extremity supported Sitting balance-Leahy Scale: Normal Sitting balance - Comments: pt able to donn socks and shift weight easily sitting EOB; no difficulty with bringing LEs up onto opposite knee to donn socks    Standing balance support: During functional activity;No upper extremity supported Standing balance-Leahy Scale: Fair Standing balance comment: pt able to stand at sink without UE support to perform ADLs; no LOB noted; pt weightbearing throuhg bil LEs at this time; tolerated static standing ~6 min for ADLs; mod I at toilet for hygiene               High level balance activites: Direction changes High Level Balance Comments: requires incr time; no LOB noted; supervision only     Cognition Arousal/Alertness: Awake/alert Behavior During Therapy: WFL for tasks assessed/performed Overall Cognitive Status: Within Functional Limits for tasks assessed  Exercises General Exercises - Lower Extremity Ankle Circles/Pumps: AROM;Both;10 reps;Seated Long Arc Quad: AROM;Both;10 reps;Seated (demo good strength and muscle control) Other Exercises Other Exercises: reviewed HEP; pt reports no difficulties with exercises              Pertinent Vitals/Pain C/o 10/10 "sharp shooting pain in Rt LE"; inconsistent strength demo throughout session. Pt appears comfortable up in chair at end of session eating breakfast; nurse tech recorded BP at end of session to be 145/85.     Home Living                      Prior Function            PT Goals (current goals can now be found in the care plan section) Acute Rehab PT Goals Patient Stated Goal: to go to rehab  PT Goal Formulation: With patient Time For Goal Achievement: 04/02/13 Potential to Achieve Goals: Good Progress towards PT goals: Progressing toward goals    Frequency  Min 3X/week    PT Plan Discharge plan needs to be updated    End of Session Equipment Utilized During Treatment: Gait belt Activity Tolerance: Patient limited by fatigue Patient left: in chair;with call bell/phone within reach     Time: 0805-0831 PT Time Calculation (min): 26 min  Charges:  $Gait Training: 8-22 mins $Therapeutic Activity: 8-22 mins                    G CodesDonell Sievert:      Debraann Livingstone N, [place] CarolinaPT [phone] 04/04/2013, 10:19 AM

## 2013-04-04 NOTE — Care Management Note (Signed)
    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 Pt is form home with wife. Plans for d/c today.     Action/Plan:   CM did discuss H services with pt and pt is refusing services at this time.   Anticipated DC Date:  04/04/2013   Anticipated DC Plan:  HOME/SELF CARE      DC Planning Services  CM consult  Patient refused services      Choice offered to / List presented to:             Status of service:  Completed, signed off Medicare Important Message given?   (If response is "NO", the following Medicare IM given date fields will be blank) Date Medicare IM given:   Date Additional Medicare IM given:    Discharge Disposition:  HOME/SELF CARE  Per UR Regulation:  Reviewed for med. necessity/level of care/duration of stay  If discussed at Long Length of Stay Meetings, dates discussed:    Comments:

## 2013-04-04 NOTE — Progress Notes (Signed)
Stroke Team Progress Note  HISTORY Kevin Jefferson is an 48 y.o. male with a history of hypertension and not compliant with taking medication regularly who developed acute onset of left-sided headache as well as dizziness and also complaining of photophobia. At one point he was nauseated and vomited. No clear focal deficits occurred. His wife described him as being in somewhat dazed. CT scan of his head showed no acute intracranial abnormality. Patient was transferred from [facility] to [facility] in code stroke status for further management. Patient was somewhat drowsy but well oriented at the time of his arrival. No clear focal deficits were noted. NIH stroke score was 2 with reduced level of alertness and drift of left lower extremity, although effort was clearly poor. Laboratory studies were unremarkable with no signs of acute metabolic abnormality. Serum glucose was 110. Urine drug screen is pending Code stroke was subsequently canceled. CTA of head and neck was normal. Patient was last seen normal 04/01/2013 at 2030.   He was admitted for further evaluation and treatment.  SUBJECTIVE No family at the bedside. Patient up in the chair, "what caused my stroke?"  OBJECTIVE Most recent Vital Signs: Filed Vitals:   04/03/13 2016 04/03/13 2216 04/04/13 0000 04/04/13 0400  BP: 198/89 146/95 154/89 118/69  Pulse: 85  76 77  Temp: 98.2 F (36.8 C)  98 F (36.7 C) 97.5 F (36.4 C)  TempSrc: Oral  Oral Oral  Resp:      Height:      Weight:    85.049 kg (187 lb 8 oz)  SpO2: 100%  99% 100%   CBG (last 3)  No results found for this basename: GLUCAP,  in the last 72 hours  IV Fluid Intake:     MEDICATIONS  . amLODipine  10 mg Oral Daily  . enoxaparin (LOVENOX) injection  40 mg Subcutaneous Q24H  . research study medication  100 mg Oral Daily  . research study medication  90 mg Oral BID  . simvastatin  20 mg Oral q1800  . triamterene-hydrochlorothiazide  1 capsule Oral Daily   PRN:  acetaminophen,  HYDROcodone-acetaminophen, metoprolol, ondansetron (ZOFRAN) IV, senna-docusate  Diet:  Cardiac thin liquids Activity:   DVT Prophylaxis:  Lovenox 40 mg sq daily   CLINICALLY SIGNIFICANT STUDIES Basic Metabolic Panel:   Recent Labs Lab 04/01/13 0745 04/02/13 1303  NA 140 140  K 4.0 3.9  CL 101 103  CO2 26 26  GLUCOSE 99 90  BUN 16 10  CREATININE 1.49* 1.42*  CALCIUM 9.0 9.3   Liver Function Tests:   Recent Labs Lab 03/31/13 2300  AST 25  ALT 43  ALKPHOS 26*  BILITOT 0.5  PROT 7.6  ALBUMIN 4.0   CBC:   Recent Labs Lab 03/31/13 2300 04/01/13 0745  WBC 8.1 7.0  NEUTROABS 4.5  --   HGB 12.3* 11.8*  HCT 36.4* 34.5*  MCV 87.3 85.6  PLT 192 175   Coagulation:   Recent Labs Lab 03/31/13 2300  LABPROT 13.0  INR 1.00   Cardiac Enzymes:   Recent Labs Lab 04/01/13 0745 04/01/13 1345 04/01/13 1827  TROPONINI <0.30 <0.30 <0.30   Urinalysis:   Recent Labs Lab 04/01/13 0120  COLORURINE YELLOW  LABSPEC 1.012  PHURINE 6.5  GLUCOSEU NEGATIVE  HGBUR NEGATIVE  BILIRUBINUR NEGATIVE  KETONESUR NEGATIVE  PROTEINUR NEGATIVE  UROBILINOGEN 0.2  NITRITE NEGATIVE  LEUKOCYTESUR TRACE*   Lipid Panel    Component Value Date/Time   CHOL 218* 04/02/2013 0643   TRIG 241*  04/02/2013 0643   HDL 30* 04/02/2013 0643   CHOLHDL 7.3 04/02/2013 0643   VLDL 48* 04/02/2013 0643   LDLCALC 140* 04/02/2013 0643   HgbA1C  Lab Results  Component Value Date   HGBA1C 5.5 04/02/2013    Urine Drug Screen:     Component Value Date/Time   LABOPIA NONE DETECTED 04/01/2013 0120   COCAINSCRNUR NONE DETECTED 04/01/2013 0120   LABBENZ NONE DETECTED 04/01/2013 0120   AMPHETMU NONE DETECTED 04/01/2013 0120   THCU NONE DETECTED 04/01/2013 0120   LABBARB NONE DETECTED 04/01/2013 0120    Alcohol Level: No results found for this basename: ETH,  in the last 168 hours  Hypercoagulable Workup Normal - C3, C4, RPR, ESR, HIV, Anti III, homocysteine, ANA, cardiolipin antibody Pending - CH50,  Prot C activity, Protein C total, Protein S activity, Protein S total, lupus anticoagulant, G20120, methylmalonic acid, factor V Leiden  CT of the brain  04/01/2013    Normal head CT.      CT Angio Head   04/01/2013   1. Normal CTA of the head without evidence of high-grade flow-limiting stenosis, proximal branch occlusion, or intracranial aneurysm.    CT Angio Neck  04/01/2013    1. Normal CTA of the neck without evidence of high-grade flow-limiting stenosis, dissection, or other acute abnormality. 2. Dominant left vertebral artery.    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 No swelling or hemorrhage.  Otherwise normal appearance the brain.    MRA of the brain  04/01/2013    No vascular stenosis or occlusion.    2D Echocardiogram  EF 60-65% with no source of embolus.   Carotid Doppler  No evidence of hemodynamically significant internal carotid artery stenosis. Vertebral artery flow is antegrade.   TEE  normal LV function; trace MR and TR; mild atherosclerosis descending aorta  CXR  03/31/2013    No acute cardiopulmonary process.     CT Angio Chest Aorta  04/01/2013   1. No evidence of thoracic aortic aneurysm or dissection. Mild atherosclerosis involving the mid and distal thoracic aorta with predominantly noncalcified plaque. 2. Bovine aortic arch anatomy. No visible atherosclerosis involving the proximal great vessels. 3. Cardiomegaly with left ventricular hypertrophy and mild LAD coronary atherosclerosis. 4.  No acute cardiopulmonary disease.     EKG  normal sinus rhythm. For complete results please see formal report.   Therapy Recommendations CIR  Physical Exam   Middle-aged [place] American male currently not in distress.Awake alert. Afebrile. Head is nontraumatic. Neck is supple without bruit. Hearing is normal. Cardiac exam no murmur or gallop. Lungs are clear to auscultation. Distal pulses are well felt. Neurological  Exam : Awake alert oriented x3 with normal speech and language function. Extraocular movements are full range without nystagmus. Face is symmetric without weakness. Fundi were not visualized. Vision acuity seems adequate. Tongue is midline. Motor system exam no upper or lower extremity drift. Mild grip weakness right greater than left but effort is poor. No lower extremity weakness. Subjective decreased sensation on the left face and arm but not the leg. Splits the midline. Coordination is slow bilateral but accurate. Gait not tested.  ASSESSMENT Mr. Shahin Knierim is a 48 y.o. male presenting with Headache, dizziness and photophobia as well as lethargy.  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 Suspect brainstem stroke not seen on MRI. Infarct felt to be embolic secondary to  unknown source. TEE negative. Hypercoagulable workup unrevealing thus far.  On no antithrombotics prior to admission. Now Enrolled in [employer] Trial for secondary stroke prevention.  There is concern for exacerbation of symptoms, nonorganic embellishment. Stroke workup is completed.  Malignant Hypertension, 197/119 on arrival  Hyperlipidemia, LDL 140, on no statin PTA, now on Zocor 20 mg daily, goal LDL < 100  Chest pain, thought to be musculoskeletal Headache, resolved Chronic kidney disease stage III Son with hx of "clots in his legs and blood", dx methylmalonic acidemia. Could possibly be familial.  Hospital day # 4  TREATMENT/PLAN  Continue SOCRATES  Trial medicine for secondary stroke prevention, comparison of 90-day treatment with ticagrelor vs aspirin for the prevention of major vascular events in patients with acute ischaemic stroke or TIA. Rehab consult - may be too high level, if so, dc home with therapies F/u Hypercoagulable panel, methylmalonic acid as an OP  Burnetta Sabin, MSN, RN, ANVP-BC, AGPCNP-BC [facility] Pager:  [phone] 04/04/2013 9:50 AM  I have personally obtained a history, examined the patient, evaluated imaging results, and formulated the assessment and plan of care. I agree with the above.  Antony Contras, MD   To contact Stroke Continuity provider, please refer to [email or web address] After hours, contact General Neurology

## 2013-04-04 NOTE — Progress Notes (Signed)
Pt provided with dc instructions and education. Pt verbalized understanding. Pt has no questions at this time. IV removed with tip intact. Hear tmonitor cleaned and returned to front. Gennell How, RN 

## 2013-04-04 NOTE — Discharge Instructions (Signed)
Stroke Prevention Some health problems and behaviors may make it more likely for you to have a stroke. Below are ways to lessen your risk of having a stroke.   Be active for at least 30 minutes on most or all days.  Do not smoke. Try not to be around others who smoke.  Do not drink too much alcohol.  Do not have more than 2 drinks a day if you are a man.  Do not have more than 1 drink a day if you are a woman and are not pregnant.  Eat healthy foods, such as fruits and vegetables. If you were put on a specific diet, follow the diet as told.  Keep your cholesterol levels under control through diet and medicines. Look for foods that are low in saturated fat, trans fat, cholesterol, and are high in fiber.  If you have diabetes, follow all diet plans and take your medicine as told.  If you have high blood pressure (hypertension), follow all diet plans and take your medicine as told.  Keep a healthy weight. Eat foods that are low in calories, salt, saturated fat, trans fat, and cholesterol.  Do not take drugs.  Avoid birth control pills, if this applies. Talk to your doctor about the risks of taking birth control pills.  Talk to your doctor if you have sleep problems (sleep apnea).  Take all medicine as told by your doctor.  You may be told to take aspirin or blood thinner medicine. Take this medicine as told by your doctor.  Understand your medicine instructions.  Make sure any other conditions you have are being taken care of. GET HELP RIGHT AWAY IF:  You suddenly lose feeling (you feel numb) or have weakness in your face, arm, or leg.  Your face or eyelid hangs down to one side.  You suddenly feel confused.  You have trouble talking (aphasia) or understanding what people are saying.  You suddenly have trouble seeing in one or both eyes.  You suddenly have trouble walking.  You are dizzy.  You lose your balance or your movements are clumsy (uncoordinated).  You  suddenly have a very bad headache and you do not know the cause.  You have new chest pain.  Your heart feels like it is fluttering or skipping a beat (irregular heartbeat). Do not wait to see if the symptoms above go away. Get help right away. Call your local emergency services (911 in [place] Do not drive yourself to the hospital. Document Released: 06/29/2011 Document Revised: 10/18/2012 Document Reviewed: 06/30/2012 [facility] Patient Information 2014 [place], [place].   STROKE/TIA DISCHARGE INSTRUCTIONS SMOKING Cigarette smoking nearly doubles your risk of having a stroke & is the single most alterable risk factor  If you smoke or have smoked in the last 12 months, you are advised to quit smoking for your health.  Most of the excess cardiovascular risk related to smoking disappears within a year of stopping.  Ask you doctor about anti-smoking medications  [place] Quit Line: 1-800-QUIT NOW  Free Smoking Cessation Classes [phone]  CHOLESTEROL Know your levels; limit fat & cholesterol in your diet  Lipid Panel     Component Value Date/Time   CHOL 218* 04/02/2013 0643   TRIG 241* 04/02/2013 0643   HDL 30* 04/02/2013 0643   CHOLHDL 7.3 04/02/2013 0643   VLDL 48* 04/02/2013 0643   LDLCALC 140* 04/02/2013 0643      Many patients benefit from treatment even if their cholesterol is at goal.  Goal: Total Cholesterol (CHOL) less than 160  Goal:  Triglycerides (TRIG) less than 150  Goal:  HDL greater than 40  Goal:  LDL (LDLCALC) less than 100   BLOOD PRESSURE American Stroke Association blood pressure target is less that 120/80 mm/Hg  Your discharge blood pressure is:  BP: 118/69 mmHg  Monitor your blood pressure  Limit your salt and alcohol intake  Many individuals will require more than one medication for high blood pressure  DIABETES (A1c is a blood sugar average for last 3 months) Goal HGBA1c is under 7% (HBGA1c is blood sugar average for last 3 months)  Diabetes: No known  diagnosis of diabetes    Lab Results  Component Value Date   HGBA1C 5.5 04/02/2013     Your HGBA1c can be lowered with medications, healthy diet, and exercise.  Check your blood sugar as directed by your physician  Call your physician if you experience unexplained or low blood sugars.  PHYSICAL ACTIVITY/REHABILITATION Goal is 30 minutes at least 4 days per week  Activity: Increase activity slowly, Therapies: Physical Therapy: Home Health and Occupational Therapy: Home Health Return to work:   Activity decreases your risk of heart attack and stroke and makes your heart stronger.  It helps control your weight and blood pressure; helps you relax and can improve your mood.  Participate in a regular exercise program.  Talk with your doctor about the best form of exercise for you (dancing, walking, swimming, cycling).  DIET/WEIGHT Goal is to maintain a healthy weight  Your discharge diet is: Cardiac thin liquids Your height is:  Height: 5\' 11"  (180.3 cm) Your current weight is: Weight: 85.049 kg (187 lb 8 oz) Your Body Mass Index (BMI) is:  BMI (Calculated): 27.3  Following the type of diet specifically designed for you will help prevent another stroke.  Your goal weight range is:    Your goal Body Mass Index (BMI) is 19-24.  Healthy food habits can help reduce 3 risk factors for stroke:  High cholesterol, hypertension, and excess weight.  RESOURCES Stroke/Support Group:  Call [phone]   STROKE EDUCATION PROVIDED/REVIEWED AND GIVEN TO PATIENT Stroke warning signs and symptoms How to activate emergency medical system (call 911). Medications prescribed at discharge. Need for follow-up after discharge. Personal risk factors for stroke. Pneumonia vaccine given: No Flu vaccine given: No My questions have been answered, the writing is legible, and I understand these instructions.  I will adhere to these goals & educational materials that have been provided to me after my discharge  from the hospital.

## 2013-04-04 NOTE — Progress Notes (Signed)
Rehab admissions - Earlier in am, I received update from [place] PT about pt's progress with mobility (now ambulating with supervision assistance, modified independent commode transfers). I met with pt and family and discussed pt's progress with therapy. Pt is moving well and no longer requires the intensity of inpatient rehab in light of his supervision level of mobility.  Discussed this with pt and family and recommend follow up home health PT services with family support as needed. Pt/family in agreement.  Will sign off pt's case. Also shared updates with Marita [place], social worker and Burnetta Sabin, Geographical information systems officer.  Please call me with any questions. Thanks.  Nanetta Batty, PT Rehabilitation Admissions Coordinator [phone]

## 2013-04-04 NOTE — Discharge Summary (Signed)
Physician Discharge Summary  Patient ID: Kevin Jefferson MRN: [id number] DOB/AGE: 49-May-1967 49 y.o.  Admit date: 03/31/2013 Discharge date: 04/04/2013  Primary Care Physician:  No primary provider on file.  Discharge Diagnoses:   Acute CVA  . atypical Chest pain .  acute Encephalopathy improved  . Hypertensive urgency . Right sided weakness . CKD (chronic kidney disease) stage 3, GFR 30-59 ml/min . IFOYDXAJ(287.8)  Consults: Neurology, Dr. Leonie Man                   Cardiology for TEE                    Inpatient rehabilitation, CIR    Recommendations for Outpatient Follow-up:  Patient was recommended home health physical therapy and occupational therapy  Patient is currently enrolled in [place] per neurology    Allergies:   Allergies  Allergen Reactions  . Hydralazine Hcl     SOB ?      Discharge Medications:   Medication List         acetaminophen 500 MG tablet  Commonly known as:  TYLENOL  Take 1,500 mg by mouth every 6 (six) hours as needed for moderate pain or headache.     amLODipine 10 MG tablet  Commonly known as:  NORVASC  Take 10 mg by mouth daily.     research study medication  Take 90 mg by mouth 2 (two) times daily.     research study medication  Take 100 mg by mouth daily.     simvastatin 20 MG tablet  Commonly known as:  ZOCOR  Take 1 tablet (20 mg total) by mouth every evening.     triamterene-hydrochlorothiazide 37.5-25 MG per capsule  Commonly known as:  DYAZIDE  Take 1 capsule by mouth daily.         Brief H and P: For complete details please refer to admission H and P, but in brief Kevin Jefferson is a 48 y.o. male who initially presented to [facility] with complaints of chest pain. His wife reported that the patient was having chest pain that began around 8 PM last evening. This began at occur while he was driving. He reported feeling generally unwell for the entire day. Patient's wife noticed some slurring of  speech, but the patient denies this. When the patient was walking, his wife noticed that he was leaning towards the right side. 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 He also reports some left facial numbness. Code stroke was called and patient was transferred to [facility] for further evaluation. Upon arrival, he was seen by neurology to further assess. It was felt the patient was not a candidate for TPA due to low NIH score and code stroke was canceled.   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 Right-sided hemiparesis, improving. Etiology is not clear. Patient underwent full stroke workup. MVE:HMCNOB acute infarction within the antero medial temporal lobe on the right. No swelling or hemorrhage.  SJG:GEZM internal carotid arteries are widely patent into the brain  Carotid Dopplers showed 1-39% ICA stenosis. Vertebral artery flow is antegrade.  ECHO no source of thrombus.  Cardiology was consulted for TEE, showed a normal LV function trace MR and TR mild atherosclerosis in the descending aorta. LDL 140, patient was placed on Simvastatin daily.  Vasculitis panel: ANA negative, ESR 15, RPR, HIV negative, C 3  normal,  Patient was evaluated by physical therapy and inpatient rehabilitation. At the time of discharge, he is ambulating with supervised assistance and does not need inpatient rehabilitation. Home physical therapy was arranged.  2-Encephalopathy; Improved.  Possibly related to a hypertensive encephalopathy and acute CVA. CT angiogram of the head and neck and chest were unremarkable for acute findings.   3-Chest pain. Patient had cardiac catheterization done in 2010 Which showed nonobstructive coronary disease. Likely musculoskeletal in origin since patient had reproducible chest pain, troponins negative. 2-D echo was done which showed EF of 65%, normal wall motion.  4-Headache. Likely  related to hypertension. Continue to monitor. Resolved.   5-Chronic kidney disease stage III.Marland Kitchen Baseline creatinine is around 1.4-1.5. Improved with IV fluids. Cr Peak to 1.7. Cr decrease to 1.4.   6-Malignant HTN; continue with Norvasc, triamterene HCTZ will be restarted at discharge.     Day of Discharge BP 141/85  Pulse 77  Temp(Src) 98.5 F (36.9 C) (Oral)  Resp 17  Ht $R'5\' 11"'os$  (1.803 m)  Wt 85.049 kg (187 lb 8 oz)  BMI 26.16 kg/m2  SpO2 99%  Physical Exam: General: Alert and awake oriented x3 not in any acute distress. HEENT: anicteric sclera, pupils reactive to light and accommodation CVS: S1-S2 clear no murmur rubs or gallops Chest: clear to auscultation bilaterally, no wheezing rales or rhonchi Abdomen: soft nontender, nondistended, normal bowel sounds Extremities: no cyanosis, clubbing or edema noted bilaterally Neuro: Cranial nerves II-XII intact, no focal neurological deficits, States decrease to sensation in the left face and arm, coordination is normal mild grip weakness on the right.   The results of significant diagnostics from this hospitalization (including imaging, microbiology, ancillary and laboratory) are listed below for reference.    LAB RESULTS: Basic Metabolic Panel:  Recent Labs Lab 04/01/13 0745 04/02/13 1303  NA 140 140  K 4.0 3.9  CL 101 103  CO2 26 26  GLUCOSE 99 90  BUN 16 10  CREATININE 1.49* 1.42*  CALCIUM 9.0 9.3   Liver Function Tests:  Recent Labs Lab 03/31/13 2300  AST 25  ALT 43  ALKPHOS 26*  BILITOT 0.5  PROT 7.6  ALBUMIN 4.0   No results found for this basename: LIPASE, AMYLASE,  in the last 168 hours No results found for this basename: AMMONIA,  in the last 168 hours CBC:  Recent Labs Lab 03/31/13 2300 04/01/13 0745  WBC 8.1 7.0  NEUTROABS 4.5  --   HGB 12.3* 11.8*  HCT 36.4* 34.5*  MCV 87.3 85.6  PLT 192 175   Cardiac Enzymes:  Recent Labs Lab 04/01/13 1345 04/01/13 1827  TROPONINI <0.30 <0.30    BNP: No components found with this basename: POCBNP,  CBG: No results found for this basename: GLUCAP,  in the last 168 hours  Significant Diagnostic Studies:  Ct Angio Head W/cm &/or Wo Cm  04/01/2013   EXAM: CT ANGIOGRAPHY HEAD AND NECK  TECHNIQUE: Multidetector CT imaging of the head and neck was performed using the standard protocol during bolus administration of intravenous contrast. Multiplanar CT image reconstructions and MIPs were obtained to evaluate the vascular anatomy. Carotid stenosis measurements (when applicable) are obtained utilizing NASCET criteria, using the distal internal carotid diameter as the denominator.  CONTRAST:  177mL OMNIPAQUE IOHEXOL 350 MG/ML SOLN  COMPARISON:  None.  FINDINGS: CTA HEAD FINDINGS  Anterior circulation: Normal appearance of the cervical internal carotid arteries, petrous, cavernous and supra clinoid internal carotid arteries. A few scattered atherosclerotic plaques noted within  the cavernous segments of the internal carotid arteries bilaterally. Widely patent anterior communicating artery. The right A1 segment is hypoplastic. Normal appearance of the anterior and middle cerebral arteries.  Posterior circulation: Left vertebral artery is dominant, with normal appearance of the vertebral arteries, vertebrobasilar junction and basilar artery, as well as main branch vessels. Basilar artery is tortuous. Small bilateral posterior communicating arteries are present. Normal appearance of posterior cerebral arteries.  No large vessel occlusion, hemodynamically significant stenosis, dissection, luminal irregularity, contrast extravasation or aneurysm within the anterior nor posterior circulation.  Review of the MIP images confirms the above findings.  CTA NECK FINDINGS  The visualized intrathoracic aorta is of normal caliber. Incidental note made of a bovine arch. Visualized subclavian arteries are widely patent bilaterally. No high-grade stenosis at the origin of the  great vessels.  The common carotid arteries are well opacified bilaterally without evidence of dissection or high-grade flow-limiting stenosis. Carotid bifurcations are normal. The internal carotid arteries are symmetric in well opacified bilaterally. No high-grade stenosis or carotid artery dissection.  Visualized external carotid arteries and their branches are within normal limits.  Both the vertebral arteries arise from the subclavian arteries. The left vertebral artery is dominant. No high-grade flow-limiting stenosis, dissection, or other abnormality identified within the vertebral arteries.  Review of the MIP images confirms the above findings.  Visualized soft tissues of the neck are within normal limits. Visualized lungs are clear.  IMPRESSION: CTA HEAD:  1. Normal CTA of the head without evidence of high-grade flow-limiting stenosis, proximal branch occlusion, or intracranial aneurysm.  CTA NECK:  1. Normal CTA of the neck without evidence of high-grade flow-limiting stenosis, dissection, or other acute abnormality. 2. Dominant left vertebral artery.   Electronically Signed   By: Jeannine Boga M.D.   On: 04/01/2013 02:56   Dg Chest 2 View  03/31/2013   CLINICAL DATA:  Intermittent abdominal pain  EXAM: CHEST  2 VIEW  COMPARISON:  CT ABD/PELV WO CM dated 06/04/2012; DG CHEST 1V PORT dated 03/02/2011  FINDINGS: Mildly enlarged cardiac silhouette. No effusion, infiltrate, pneumothorax. No acute osseous abnormality.  IMPRESSION: No acute cardiopulmonary process.   Electronically Signed   By: Suzy Bouchard M.D.   On: 03/31/2013 23:57   Ct Head Wo Contrast  04/01/2013   CLINICAL DATA:  Chest pain, hypertension.  EXAM: CT HEAD WITHOUT CONTRAST  TECHNIQUE: Contiguous axial images were obtained from the base of the skull through the vertex without intravenous contrast.  COMPARISON:  CT HEAD W/O CM dated 11/25/2008  FINDINGS: No acute intracranial hemorrhage. No focal mass lesion. No CT evidence of  acute infarction. No midline shift or mass effect. No hydrocephalus. Basilar cisterns are patent. Paranasal sinuses and mastoid air cells are clear.  IMPRESSION: Normal head CT.   Electronically Signed   By: Suzy Bouchard M.D.   On: 04/01/2013 00:35   Ct Angio Neck W/cm &/or Wo/cm  04/01/2013   EXAM: CT ANGIOGRAPHY HEAD AND NECK  TECHNIQUE: Multidetector CT imaging of the head and neck was performed using the standard protocol during bolus administration of intravenous contrast. Multiplanar CT image reconstructions and MIPs were obtained to evaluate the vascular anatomy. Carotid stenosis measurements (when applicable) are obtained utilizing NASCET criteria, using the distal internal carotid diameter as the denominator.  CONTRAST:  119mL OMNIPAQUE IOHEXOL 350 MG/ML SOLN  COMPARISON:  None.  FINDINGS: CTA HEAD FINDINGS  Anterior circulation: Normal appearance of the cervical internal carotid arteries, petrous, cavernous and supra clinoid internal carotid arteries.  A few scattered atherosclerotic plaques noted within the cavernous segments of the internal carotid arteries bilaterally. Widely patent anterior communicating artery. The right A1 segment is hypoplastic. Normal appearance of the anterior and middle cerebral arteries.  Posterior circulation: Left vertebral artery is dominant, with normal appearance of the vertebral arteries, vertebrobasilar junction and basilar artery, as well as main branch vessels. Basilar artery is tortuous. Small bilateral posterior communicating arteries are present. Normal appearance of posterior cerebral arteries.  No large vessel occlusion, hemodynamically significant stenosis, dissection, luminal irregularity, contrast extravasation or aneurysm within the anterior nor posterior circulation.  Review of the MIP images confirms the above findings.  CTA NECK FINDINGS  The visualized intrathoracic aorta is of normal caliber. Incidental note made of a bovine arch. Visualized  subclavian arteries are widely patent bilaterally. No high-grade stenosis at the origin of the great vessels.  The common carotid arteries are well opacified bilaterally without evidence of dissection or high-grade flow-limiting stenosis. Carotid bifurcations are normal. The internal carotid arteries are symmetric in well opacified bilaterally. No high-grade stenosis or carotid artery dissection.  Visualized external carotid arteries and their branches are within normal limits.  Both the vertebral arteries arise from the subclavian arteries. The left vertebral artery is dominant. No high-grade flow-limiting stenosis, dissection, or other abnormality identified within the vertebral arteries.  Review of the MIP images confirms the above findings.  Visualized soft tissues of the neck are within normal limits. Visualized lungs are clear.  IMPRESSION: CTA HEAD:  1. Normal CTA of the head without evidence of high-grade flow-limiting stenosis, proximal branch occlusion, or intracranial aneurysm.  CTA NECK:  1. Normal CTA of the neck without evidence of high-grade flow-limiting stenosis, dissection, or other acute abnormality. 2. Dominant left vertebral artery.   Electronically Signed   By: Jeannine Boga M.D.   On: 04/01/2013 02:56   Mr Brain Wo Contrast  04/01/2013   CLINICAL DATA:  Right-sided weakness and left facial numbness.  EXAM: MRI HEAD WITHOUT CONTRAST  MRA HEAD WITHOUT CONTRAST  TECHNIQUE: Multiplanar, multiecho pulse sequences of the brain and surrounding structures were obtained without intravenous contrast. Angiographic images of the head were obtained using MRA technique without contrast.  COMPARISON:  Head CT same day.  FINDINGS: MRI HEAD FINDINGS  Diffusion imaging shows a linear focus of acute infarction in the anteromedial temporal lobe on the right. No other acute infarction. The brainstem and cerebellum are normal. The cerebral hemispheres are otherwise normal. No other old or acute insult. No  mass lesion, hemorrhage, hydrocephalus or extra-axial collection. No pituitary mass. No inflammatory sinus disease.  MRA HEAD FINDINGS  Both internal carotid arteries are widely patent into the brain. No siphon stenosis. The right internal carotid artery supplies the right middle cerebral artery in the right posterior cerebral artery. The left internal carotid artery supplies the left middle cerebral artery and both anterior cerebral arteries.  Both vertebral arteries are patent with the left being dominant. No basilar stenosis. Posterior circulation branch vessels appear patent and normal.  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 No swelling or hemorrhage.  Otherwise normal appearance the brain.  No vascular stenosis or occlusion.   Electronically Signed   By: Nelson Chimes M.D.   On: 04/01/2013 15:46   Mr Jodene Nam Head/brain Wo Cm  04/01/2013   CLINICAL DATA:  Right-sided weakness and left facial numbness.  EXAM: MRI HEAD WITHOUT CONTRAST  MRA HEAD WITHOUT CONTRAST  TECHNIQUE: Multiplanar, multiecho pulse sequences  of the brain and surrounding structures were obtained without intravenous contrast. Angiographic images of the head were obtained using MRA technique without contrast.  COMPARISON:  Head CT same day.  FINDINGS: MRI HEAD FINDINGS  Diffusion imaging shows a linear focus of acute infarction in the anteromedial temporal lobe on the right. No other acute infarction. The brainstem and cerebellum are normal. The cerebral hemispheres are otherwise normal. No other old or acute insult. No mass lesion, hemorrhage, hydrocephalus or extra-axial collection. No pituitary mass. No inflammatory sinus disease.  MRA HEAD FINDINGS  Both internal carotid arteries are widely patent into the brain. No siphon stenosis. The right internal carotid artery supplies the right middle cerebral artery in the right posterior cerebral artery. The left internal carotid artery supplies the  left middle cerebral artery and both anterior cerebral arteries.  Both vertebral arteries are patent with the left being dominant. No basilar stenosis. Posterior circulation branch vessels appear patent and normal.  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 No swelling or hemorrhage.  Otherwise normal appearance the brain.  No vascular stenosis or occlusion.   Electronically Signed   By: Nelson Chimes M.D.   On: 04/01/2013 15:46   Ct Angio Chest Aorta W/cm &/or Wo/cm  04/01/2013   CLINICAL DATA:  Left-sided chest pain. Left-sided weakness and numbness. Lethargy.  EXAM: CT ANGIOGRAPHY CHEST WITH CONTRAST  TECHNIQUE: Multidetector CT imaging of the chest was performed using the standard protocol during bolus administration of intravenous contrast. Multiplanar CT image reconstructions and MIPs were obtained to evaluate the vascular anatomy.  CONTRAST:  178mL OMNIPAQUE IOHEXOL 350 MG/ML IV.  COMPARISON:  DG CHEST 2 VIEW dated 03/31/2013; DG CHEST 1V PORT dated 03/02/2011; DG CHEST 2 VIEW dated 11/25/2008  FINDINGS: Unenhanced images demonstrate no evidence of mural hematoma in the thoracic her of abdominal aorta. Calcified plaque is present in the distal thoracic aorta.  Enhanced images demonstrate no evidence of thoracic or upper abdominal aortic aneurysm or dissection. Mild atherosclerosis involving the thoracic aorta with predominantly noncalcified plaque. Bovine aortic arch anatomy is present (left common carotid artery arises from the innominate artery). No visible atherosclerosis involving the proximal great vessels.  Mild LAD coronary atherosclerosis. Cardiomegaly with left ventricular hypertrophy. No pericardial effusion. Central pulmonary arteries patent.  No significant mediastinal, hilar, or axillary lymphadenopathy. Visualized thyroid gland unremarkable.  Expected dependent atelectasis posteriorly in the lower lobes. Pulmonary parenchyma clear without localized  airspace consolidation, interstitial disease, or parenchymal nodules or masses. Central airways patent without significant bronchial wall thickening.  Visualized upper abdomen unremarkable for the early arterial phase of enhancement. Bone window images unremarkable.  Review of the MIP images confirms the above findings.  IMPRESSION: 1. No evidence of thoracic aortic aneurysm or dissection. Mild atherosclerosis involving the mid and distal thoracic aorta with predominantly noncalcified plaque. 2. Bovine aortic arch anatomy. No visible atherosclerosis involving the proximal great vessels. 3. Cardiomegaly with left ventricular hypertrophy and mild LAD coronary atherosclerosis. 4.  No acute cardiopulmonary disease.   Electronically Signed   By: Evangeline Dakin M.D.   On: 04/01/2013 02:57    2D ECHO: Study Conclusions  - Left ventricle: Systolic function was normal. The estimated ejection fraction was in the range of 55% to 60%. Wall motion was normal; there were no regional wall motion abnormalities. - Aortic valve: No evidence of vegetation. - Mitral valve: No evidence of vegetation. - Left atrium: No evidence of thrombus in the atrial cavity or appendage. -  Atrial septum: No defect or patent foramen ovale was identified. Echo contrast study showed no right-to-left atrial level shunt. - Tricuspid valve: No evidence of vegetation. - Pulmonic valve: No evidence of vegetation.    Disposition and Follow-up:     Discharge Orders   Future Orders Complete By Expires   Diet - low sodium heart healthy  As directed    Increase activity slowly  As directed        DISPOSITION: Home  DIET: Heart healthy diet  DISCHARGE FOLLOW-UP Follow-up Information   Follow up with Forbes Cellar, MD In 2 months. (for hospital follow-up for stroke )    Specialties:  Neurology, Radiology   Contact information:   [address] [place] [address] [phone]       Time spent on  Discharge: 35 minutes  Signed:   RAI,RIPUDEEP M.D. [facility] 04/04/2013, 2:03 PM Pager: [phone]

## 2013-04-05 LAB — BETA-2-GLYCOPROTEIN I ABS, IGG/M/A
BETA-2-GLYCOPROTEIN I IGM: 7 M Units (ref <20)
Beta-2 Glyco I IgG: 6 G Units (ref <20)
Beta-2-Glycoprotein I IgA: 8 A Units (ref <20)

## 2013-04-05 LAB — METHYLMALONIC ACID, SERUM: Methylmalonic Acid, Quantitative: 0.2 umol/L (ref <0.40)

## 2013-05-03 ENCOUNTER — Encounter: Payer: Self-pay | Admitting: *Deleted

## 2013-05-03 ENCOUNTER — Telehealth: Payer: Self-pay | Admitting: Neurology

## 2013-05-03 NOTE — Telephone Encounter (Signed)
I have never seen this patient. - LL

## 2013-05-03 NOTE — Telephone Encounter (Signed)
Patient called, that his antidepressant was not called into his pharmacy, chart reviewed, not sure which medicine is planned, please address

## 2013-05-04 ENCOUNTER — Encounter: Payer: Self-pay | Admitting: *Deleted

## 2013-05-04 NOTE — Telephone Encounter (Signed)
Have primary MD address 

## 2013-05-07 NOTE — Telephone Encounter (Signed)
Tried to call and mailbox full.

## 2013-05-08 NOTE — Telephone Encounter (Signed)
Still not able to LM on phone.  Will await for call back from pt with another number.

## 2013-11-20 ENCOUNTER — Emergency Department (HOSPITAL_BASED_OUTPATIENT_CLINIC_OR_DEPARTMENT_OTHER): Payer: BC Managed Care – PPO

## 2013-11-20 ENCOUNTER — Encounter (HOSPITAL_BASED_OUTPATIENT_CLINIC_OR_DEPARTMENT_OTHER): Payer: Self-pay | Admitting: *Deleted

## 2013-11-20 ENCOUNTER — Emergency Department (HOSPITAL_BASED_OUTPATIENT_CLINIC_OR_DEPARTMENT_OTHER)
Admission: EM | Admit: 2013-11-20 | Discharge: 2013-11-20 | Disposition: A | Discharge status: Home / Self Care | Payer: BC Managed Care – PPO | Attending: Emergency Medicine | Admitting: Emergency Medicine

## 2013-11-20 DIAGNOSIS — I10 Essential (primary) hypertension: Secondary | ICD-10-CM | POA: Diagnosis not present

## 2013-11-20 DIAGNOSIS — R109 Unspecified abdominal pain: Secondary | ICD-10-CM | POA: Insufficient documentation

## 2013-11-20 DIAGNOSIS — R51 Headache: Secondary | ICD-10-CM | POA: Diagnosis not present

## 2013-11-20 DIAGNOSIS — R1030 Lower abdominal pain, unspecified: Secondary | ICD-10-CM

## 2013-11-20 DIAGNOSIS — Z8673 Personal history of transient ischemic attack (TIA), and cerebral infarction without residual deficits: Secondary | ICD-10-CM | POA: Diagnosis not present

## 2013-11-20 DIAGNOSIS — Z79899 Other long term (current) drug therapy: Secondary | ICD-10-CM | POA: Diagnosis not present

## 2013-11-20 DIAGNOSIS — R519 Headache, unspecified: Secondary | ICD-10-CM

## 2013-11-20 DIAGNOSIS — R0602 Shortness of breath: Secondary | ICD-10-CM

## 2013-11-20 LAB — URINALYSIS, ROUTINE W REFLEX MICROSCOPIC
Glucose, UA: NEGATIVE mg/dL
Hgb urine dipstick: NEGATIVE
KETONES UR: 15 mg/dL — AB
NITRITE: NEGATIVE
Protein, ur: 30 mg/dL — AB
Specific Gravity, Urine: 1.026 (ref 1.005–1.030)
Urobilinogen, UA: 1 mg/dL (ref 0.0–1.0)
pH: 5.5 (ref 5.0–8.0)

## 2013-11-20 LAB — COMPREHENSIVE METABOLIC PANEL
ALBUMIN: 3.5 g/dL (ref 3.5–5.2)
ALK PHOS: 27 U/L — AB (ref 39–117)
ALT: 14 U/L (ref 0–53)
ANION GAP: 13 (ref 5–15)
AST: 14 U/L (ref 0–37)
BUN: 15 mg/dL (ref 6–23)
CHLORIDE: 103 meq/L (ref 96–112)
CO2: 26 mEq/L (ref 19–32)
Calcium: 9.4 mg/dL (ref 8.4–10.5)
Creatinine, Ser: 1.9 mg/dL — ABNORMAL HIGH (ref 0.50–1.35)
GFR calc Af Amer: 46 mL/min — ABNORMAL LOW (ref >90)
GFR calc non Af Amer: 40 mL/min — ABNORMAL LOW (ref >90)
GLUCOSE: 117 mg/dL — AB (ref 70–99)
POTASSIUM: 3.8 meq/L (ref 3.7–5.3)
Sodium: 142 mEq/L (ref 137–147)
Total Bilirubin: 0.2 mg/dL — ABNORMAL LOW (ref 0.3–1.2)
Total Protein: 6.8 g/dL (ref 6.0–8.3)

## 2013-11-20 LAB — CBC
HEMATOCRIT: 33.2 % — AB (ref 39.0–52.0)
HEMOGLOBIN: 11.5 g/dL — AB (ref 13.0–17.0)
MCH: 30.3 pg (ref 26.0–34.0)
MCHC: 34.6 g/dL (ref 30.0–36.0)
MCV: 87.4 fL (ref 78.0–100.0)
Platelets: 211 10*3/uL (ref 150–400)
RBC: 3.8 MIL/uL — ABNORMAL LOW (ref 4.22–5.81)
RDW: 13 % (ref 11.5–15.5)
WBC: 7.1 10*3/uL (ref 4.0–10.5)

## 2013-11-20 LAB — URINE MICROSCOPIC-ADD ON

## 2013-11-20 LAB — TROPONIN I: Troponin I: <0.3 ng/mL (ref <0.30)

## 2013-11-20 LAB — LIPASE, BLOOD: Lipase: 21 U/L (ref 11–59)

## 2013-11-20 NOTE — ED Notes (Signed)
Pt discharged to home with family. NAD.  

## 2013-11-20 NOTE — ED Notes (Signed)
Pt. Reports he felt a pain in his jaw today that felt the way it did when he had his stroke.  Pt. Kevin SpanielSaid he has had a headache for approx. 2 days.  Pt. Reports today he started sweating real bad at approx. 1530.   Pt. Reports L side headache with no numbness and no tingling.  No speech disorder noted.

## 2013-11-20 NOTE — Discharge Instructions (Signed)
Take an 81 mg ASA daily. Follow up with your primary care doctor and neurologist.  General Headache Without Cause A headache is pain or discomfort felt around the head or neck area. The specific cause of a headache may not be found. There are many causes and types of headaches. A few common ones are:  Tension headaches.  Migraine headaches.  Cluster headaches.  Chronic daily headaches. HOME CARE INSTRUCTIONS   Keep all follow-up appointments with your caregiver or any specialist referral.  Only take over-the-counter or prescription medicines for pain or discomfort as directed by your caregiver.  Lie down in a dark, quiet room when you have a headache.  Keep a headache journal to find out what may trigger your migraine headaches. For example, write down:  What you eat and drink.  How much sleep you get.  Any change to your diet or medicines.  Try massage or other relaxation techniques.  Put ice packs or heat on the head and neck. Use these 3 to 4 times per day for 15 to 20 minutes each time, or as needed.  Limit stress.  Sit up straight, and do not tense your muscles.  Quit smoking if you smoke.  Limit alcohol use.  Decrease the amount of caffeine you drink, or stop drinking caffeine.  Eat and sleep on a regular schedule.  Get 7 to 9 hours of sleep, or as recommended by your caregiver.  Keep lights dim if bright lights bother you and make your headaches worse. SEEK MEDICAL CARE IF:   You have problems with the medicines you were prescribed.  Your medicines are not working.  You have a change from the usual headache.  You have nausea or vomiting. SEEK IMMEDIATE MEDICAL CARE IF:   Your headache becomes severe.  You have a fever.  You have a stiff neck.  You have loss of vision.  You have muscular weakness or loss of muscle control.  You start losing your balance or have trouble walking.  You feel faint or pass out.  You have severe symptoms that  are different from your first symptoms. MAKE SURE YOU:   Understand these instructions.  Will watch your condition.  Will get help right away if you are not doing well or get worse. Document Released: 12/28/2004 Document Revised: 03/22/2011 Document Reviewed: 01/13/2011 [facility] Patient Information 2015 [place], [place]. This information is not intended to replace advice given to you by your health care provider. Make sure you discuss any questions you have with your health care provider.   Shortness of Breath Shortness of breath means you have trouble breathing. It could also mean that you have a medical problem. You should get immediate medical care for shortness of breath. CAUSES   Not enough oxygen in the air such as with high altitudes or a smoke-filled room.  Certain lung diseases, infections, or problems.  Heart disease or conditions, such as angina or heart failure.  Low red blood cells (anemia).  Poor physical fitness, which can cause shortness of breath when you exercise.  Chest or back injuries or stiffness.  Being overweight.  Smoking.  Anxiety, which can make you feel like you are not getting enough air. DIAGNOSIS  Serious medical problems can often be found during your physical exam. Tests may also be done to determine why you are having shortness of breath. Tests may include:  Chest X-rays.  Lung function tests.  Blood tests.  An electrocardiogram (ECG).  An ambulatory electrocardiogram. An ambulatory ECG  records your heartbeat patterns over a 24-hour period.  Exercise testing.  A transthoracic echocardiogram (TTE). During echocardiography, sound waves are used to evaluate how blood flows through your heart.  A transesophageal echocardiogram (TEE).  Imaging scans. Your health care provider may not be able to find a cause for your shortness of breath after your exam. In this case, it is important to have a follow-up exam with your health care provider  as directed.  TREATMENT  Treatment for shortness of breath depends on the cause of your symptoms and can vary greatly. HOME CARE INSTRUCTIONS   Do not smoke. Smoking is a common cause of shortness of breath. If you smoke, ask for help to quit.  Avoid being around chemicals or things that may bother your breathing, such as paint fumes and dust.  Rest as needed. Slowly resume your usual activities.  If medicines were prescribed, take them as directed for the full length of time directed. This includes oxygen and any inhaled medicines.  Keep all follow-up appointments as directed by your health care provider. SEEK MEDICAL CARE IF:   Your condition does not improve in the time expected.  You have a hard time doing your normal activities even with rest.  You have any new symptoms. SEEK IMMEDIATE MEDICAL CARE IF:   Your shortness of breath gets worse.  You feel light-headed, faint, or develop a cough not controlled with medicines.  You start coughing up blood.  You have pain with breathing.  You have chest pain or pain in your arms, shoulders, or abdomen.  You have a fever.  You are unable to walk up stairs or exercise the way you normally do. MAKE SURE YOU:  Understand these instructions.  Will watch your condition.  Will get help right away if you are not doing well or get worse. Document Released: 09/22/2000 Document Revised: 01/02/2013 Document Reviewed: 03/15/2011 [facility] Patient Information 2015 [place], [place]. This information is not intended to replace advice given to you by your health care provider. Make sure you discuss any questions you have with your health care provider.  Abdominal Pain Many things can cause abdominal pain. Usually, abdominal pain is not caused by a disease and will improve without treatment. It can often be observed and treated at home. Your health care provider will do a physical exam and possibly order blood tests and X-rays to help  determine the seriousness of your pain. However, in many cases, more time must pass before a clear cause of the pain can be found. Before that point, your health care provider may not know if you need more testing or further treatment. HOME CARE INSTRUCTIONS  Monitor your abdominal pain for any changes. The following actions may help to alleviate any discomfort you are experiencing:  Only take over-the-counter or prescription medicines as directed by your health care provider.  Do not take laxatives unless directed to do so by your health care provider.  Try a clear liquid diet (broth, tea, or water) as directed by your health care provider. Slowly move to a bland diet as tolerated. SEEK MEDICAL CARE IF:  You have unexplained abdominal pain.  You have abdominal pain associated with nausea or diarrhea.  You have pain when you urinate or have a bowel movement.  You experience abdominal pain that wakes you in the night.  You have abdominal pain that is worsened or improved by eating food.  You have abdominal pain that is worsened with eating fatty foods.  You have  a fever. SEEK IMMEDIATE MEDICAL CARE IF:   Your pain does not go away within 2 hours.  You keep throwing up (vomiting).  Your pain is felt only in portions of the abdomen, such as the right side or the left lower portion of the abdomen.  You pass bloody or black tarry stools. MAKE SURE YOU:  Understand these instructions.   Will watch your condition.   Will get help right away if you are not doing well or get worse.  Document Released: 10/07/2004 Document Revised: 01/02/2013 Document Reviewed: 09/06/2012 [facility] Patient Information 2015 [place], [place]. This information is not intended to replace advice given to you by your health care provider. Make sure you discuss any questions you have with your health care provider.

## 2013-11-20 NOTE — ED Provider Notes (Signed)
CSN: [id number]     Arrival date & time 11/20/13  1843 History   First MD Initiated Contact with Patient 11/20/13 1912     Chief Complaint  Patient presents with  . Abdominal Pain     (Consider location/radiation/quality/duration/timing/severity/associated sxs/prior Treatment) HPI Comments: This is a 48 year old male with a past medical history of hypertension and stroke who presents to the emergency department complaining of headache 2 days. Patient reports initially he had a gradual onset generalized, mild headache, however and 3:30 PM today while he was at lunch he developed a worsening headache, located behind his left eye and the left side of his head, described as a sharp pain, similar to his prior stroke. At that time, he felt diaphoretic and slightly short of breath. These symptoms lasted only a few minutes and subsided on their own. Headache still present, however only mild and throbbing at this time. Three hours later around 6:30 PM, he started to develop mild lower abdominal pain described as cramping with associated nausea and one episode of vomiting. Abdominal pain subsided on arrival to ED. Denies fever, chills, diarrhea, chest pain, vision changes, speech changes, facial droop, extremity numbness or weakness. With his prior stroke, he had left sided arm and leg weakness along with facial droop.  Patient is a 48 y.o. male presenting with abdominal pain. The history is provided by the patient.  Abdominal Pain   Past Medical History  Diagnosis Date  . Hypertension    Past Surgical History  Procedure Laterality Date  . Tee without cardioversion N/A 04/03/2013    Procedure: TRANSESOPHAGEAL ECHOCARDIOGRAM (TEE);  Surgeon: Lewayne Bunting, MD;  Location: [facility] ENDOSCOPY;  Service: Cardiovascular;  Laterality: N/A;   Family History  Problem Relation Age of Onset  . Hypertension Mother   . Hypertension Father   . Irregular heart beat Maternal Grandmother    History  Substance Use  Topics  . Smoking status: Never Smoker   . Smokeless tobacco: Not on file  . Alcohol Use: No    Review of Systems  Gastrointestinal: Positive for abdominal pain.   10 Systems reviewed and are negative for acute change except as noted in the HPI.   Allergies  Hydralazine hcl  Home Medications   Prior to Admission medications   Medication Sig Start Date End Date Taking? Authorizing Provider  acetaminophen (TYLENOL) 500 MG tablet Take 1,500 mg by mouth every 6 (six) hours as needed for moderate pain or headache.    Historical Provider, MD  amLODipine (NORVASC) 10 MG tablet Take 10 mg by mouth daily.    Historical Provider, MD  research study medication Take 90 mg by mouth 2 (two) times daily. 04/04/13   Ripudeep Jenna Luo, MD  research study medication Take 100 mg by mouth daily. 04/04/13   Ripudeep Jenna Luo, MD  simvastatin (ZOCOR) 20 MG tablet Take 1 tablet (20 mg total) by mouth every evening. 04/04/13   Ripudeep Jenna Luo, MD  triamterene-hydrochlorothiazide (DYAZIDE) 37.5-25 MG per capsule Take 1 capsule by mouth daily.     Historical Provider, MD   BP 120/67 mmHg  Pulse 76  Temp(Src) 99 F (37.2 C) (Oral)  Resp 18  Ht 5\' 10"  (1.778 m)  Wt 190 lb (86.183 kg)  BMI 27.26 kg/m2  SpO2 100% Physical Exam  Constitutional: He is oriented to person, place, and time. He appears well-developed and well-nourished. No distress.  HENT:  Head: Normocephalic and atraumatic.  Mouth/Throat: Oropharynx is clear and moist.  Eyes: Conjunctivae  and EOM are normal. Pupils are equal, round, and reactive to light.  Neck: Normal range of motion. Neck supple. No JVD present.  Cardiovascular: Normal rate, regular rhythm, normal heart sounds and intact distal pulses.   No extremity edema.  Pulmonary/Chest: Effort normal and breath sounds normal. No respiratory distress.  Abdominal: Soft. Bowel sounds are normal. There is no tenderness.  Musculoskeletal: Normal range of motion. He exhibits no edema.   Neurological: He is alert and oriented to person, place, and time. He has normal strength. No cranial nerve deficit or sensory deficit. Coordination normal.  Speech fluent, goal oriented. Moves limbs without ataxia. Equal grip strength bilateral. No facial asymmetry.  Skin: Skin is warm and dry. He is not diaphoretic.  Psychiatric: He has a normal mood and affect. His behavior is normal.  Nursing note and vitals reviewed.   ED Course  Procedures (including critical care time) Labs Review Labs Reviewed  URINALYSIS, ROUTINE W REFLEX MICROSCOPIC - Abnormal; Notable for the following:    Color, Urine AMBER (*)    Bilirubin Urine SMALL (*)    Ketones, ur 15 (*)    Protein, ur 30 (*)    Leukocytes, UA TRACE (*)    All other components within normal limits  URINE MICROSCOPIC-ADD ON - Abnormal; Notable for the following:    Casts HYALINE CASTS (*)    All other components within normal limits  CBC - Abnormal; Notable for the following:    RBC 3.80 (*)    Hemoglobin 11.5 (*)    HCT 33.2 (*)    All other components within normal limits  COMPREHENSIVE METABOLIC PANEL - Abnormal; Notable for the following:    Glucose, Bld 117 (*)    Creatinine, Ser 1.90 (*)    Alkaline Phosphatase 27 (*)    Total Bilirubin 0.2 (*)    GFR calc non Af Amer 40 (*)    GFR calc Af Amer 46 (*)    All other components within normal limits  LIPASE, BLOOD  TROPONIN I    Imaging Review Ct Head Wo Contrast  11/20/2013   CLINICAL DATA:  LEFT side headache at supraorbital region, dizziness without syncope, history of stroke in February 2015  EXAM: CT HEAD WITHOUT CONTRAST  TECHNIQUE: Contiguous axial images were obtained from the base of the skull through the vertex without intravenous contrast.  COMPARISON:  03/31/2013  FINDINGS: Beam hardening artifacts at skullbase.  Normal ventricular morphology.  No midline shift or mass effect.  Normal appearance of brain parenchyma.  No intracranial hemorrhage, mass lesion  or evidence acute infarction.  No extra-axial fluid collections.  Bones and sinuses unremarkable.  IMPRESSION: No acute intracranial abnormalities.   Electronically Signed   By: Ulyses SouthwardMark  Boles M.D.   On: 11/20/2013 20:34     EKG Interpretation   Date/Time:  Tuesday November 20 2013 19:43:25 EST Ventricular Rate:  78 PR Interval:  166 QRS Duration: 96 QT Interval:  376 QTC Calculation: 428 R Axis:   -12 Text Interpretation:  Normal sinus rhythm Nonspecific T wave abnormality  Abnormal ECG Confirmed by ZAVITZ  MD, JOSHUA ([id number]) on 11/20/2013 7:45:53  PM      MDM   Final diagnoses:  Headache  Shortness of breath  Lower abdominal pain   Patient with history of stroke as stated above. He is well-appearing and in no apparent distress. Vital signs stable. No focal neuro deficits. No abdominal pain in the ED. No reported weakness or numbness. He does not take any  ASA. Labs showing slight elevation in creatinine, otherwise no acute findings. Cardiac workup negative. Head CT without any acute findings. Doubt CVA, SAH, ICH. Plan to start pt on ASA daily and f/u with his PCP and neurologist. Discussed pt with Dr. Jodi MourningZavitz who also evaluated pt and agrees with this plan of care. He is stable for d/c. Return precautions given. Patient states understanding of treatment care plan and is agreeable.  Kathrynn SpeedRobyn M Jazalyn Mondor, PA-C 11/22/13 1619  Enid SkeensJoshua M Zavitz, MD 11/23/13 [phone]

## 2013-11-20 NOTE — ED Notes (Signed)
Abdominal pain, headache, and dry mouth x 3 days.

## 2014-01-24 ENCOUNTER — Encounter (HOSPITAL_COMMUNITY): Payer: Self-pay | Admitting: Emergency Medicine

## 2014-01-24 ENCOUNTER — Emergency Department (HOSPITAL_COMMUNITY)
Admission: EM | Admit: 2014-01-24 | Discharge: 2014-01-25 | Disposition: A | Discharge status: Home / Self Care | Payer: BLUE CROSS/BLUE SHIELD | Attending: Emergency Medicine | Admitting: Emergency Medicine

## 2014-01-24 ENCOUNTER — Emergency Department (HOSPITAL_COMMUNITY): Payer: BLUE CROSS/BLUE SHIELD

## 2014-01-24 DIAGNOSIS — Z8673 Personal history of transient ischemic attack (TIA), and cerebral infarction without residual deficits: Secondary | ICD-10-CM | POA: Insufficient documentation

## 2014-01-24 DIAGNOSIS — I639 Cerebral infarction, unspecified: Secondary | ICD-10-CM

## 2014-01-24 DIAGNOSIS — R4781 Slurred speech: Secondary | ICD-10-CM | POA: Insufficient documentation

## 2014-01-24 DIAGNOSIS — I10 Essential (primary) hypertension: Secondary | ICD-10-CM | POA: Diagnosis not present

## 2014-01-24 DIAGNOSIS — R531 Weakness: Secondary | ICD-10-CM | POA: Diagnosis not present

## 2014-01-24 DIAGNOSIS — Z79899 Other long term (current) drug therapy: Secondary | ICD-10-CM | POA: Insufficient documentation

## 2014-01-24 DIAGNOSIS — R5383 Other fatigue: Secondary | ICD-10-CM | POA: Diagnosis not present

## 2014-01-24 DIAGNOSIS — R05 Cough: Secondary | ICD-10-CM | POA: Diagnosis not present

## 2014-01-24 HISTORY — DX: Cerebral infarction, unspecified: I63.9

## 2014-01-24 LAB — CBC
HCT: 36.3 % — ABNORMAL LOW (ref 39.0–52.0)
HEMOGLOBIN: 12.3 g/dL — AB (ref 13.0–17.0)
MCH: 29.5 pg (ref 26.0–34.0)
MCHC: 33.9 g/dL (ref 30.0–36.0)
MCV: 87.1 fL (ref 78.0–100.0)
Platelets: 179 10*3/uL (ref 150–400)
RBC: 4.17 MIL/uL — AB (ref 4.22–5.81)
RDW: 13.4 % (ref 11.5–15.5)
WBC: 6.9 10*3/uL (ref 4.0–10.5)

## 2014-01-24 LAB — I-STAT TROPONIN, ED: Troponin i, poc: 0 ng/mL (ref 0.00–0.08)

## 2014-01-24 LAB — PROTIME-INR
INR: 1.03 (ref 0.00–1.49)
PROTHROMBIN TIME: 13.6 s (ref 11.6–15.2)

## 2014-01-24 LAB — DIFFERENTIAL
BASOS ABS: 0 10*3/uL (ref 0.0–0.1)
Basophils Relative: 0 % (ref 0–1)
EOS ABS: 0.2 10*3/uL (ref 0.0–0.7)
Eosinophils Relative: 3 % (ref 0–5)
Lymphocytes Relative: 36 % (ref 12–46)
Lymphs Abs: 2.5 10*3/uL (ref 0.7–4.0)
Monocytes Absolute: 0.6 10*3/uL (ref 0.1–1.0)
Monocytes Relative: 9 % (ref 3–12)
NEUTROS ABS: 3.6 10*3/uL (ref 1.7–7.7)
Neutrophils Relative %: 52 % (ref 43–77)

## 2014-01-24 LAB — CBG MONITORING, ED: Glucose-Capillary: 120 mg/dL — ABNORMAL HIGH (ref 70–99)

## 2014-01-24 LAB — APTT: aPTT: 32 seconds (ref 24–37)

## 2014-01-24 NOTE — ED Provider Notes (Signed)
CSN: [id number]     Arrival date & time 01/24/14  2307 History  This chart was scribed for Tomasita Crumble, MD by Murriel Hopper, ED Scribe. This patient was seen in room [facility] and the patient's care was started at 11:31 PM.     Chief Complaint  Patient presents with  . Code Stroke    The patient said he started having a headache two days ago and today he began to have left sided weakness.     The history is provided by the patient and the spouse. No language interpreter was used.     HPI Comments: Kevin Jefferson is a 49 y.o. male brought in by his wife who presents to the Emergency Department complaining of stroke-like symptoms. Pt had a stroke nine months ago and had deficits that resolved. His wife states that earlier today around 10 hours PTA pt was trying to grip a glass to drink and could not pick it up. Pt states that he has felt weak, tired, and has had slurred speech in the past couple of days, but notes that sx worsened today. Wife states this is consistent with his prior presentation of stroke. Pt also notes he has had a dry cough for a few weeks, and notes that he was constipated yesterday morning.   He describes that he has felt poorly over the last week. He describes this as just an overall weakness. He denies any infectious history. He states he is inconsistent with his medications. Patient has no further complaints.    Past Medical History  Diagnosis Date  . Hypertension   . Stroke    Past Surgical History  Procedure Laterality Date  . Tee without cardioversion N/A 04/03/2013    Procedure: TRANSESOPHAGEAL ECHOCARDIOGRAM (TEE);  Surgeon: Lewayne Bunting, MD;  Location: [facility] ENDOSCOPY;  Service: Cardiovascular;  Laterality: N/A;   Family History  Problem Relation Age of Onset  . Hypertension Mother   . Hypertension Father   . Irregular heart beat Maternal Grandmother    History  Substance Use Topics  . Smoking status: Never Smoker   . Smokeless tobacco: Not on file   . Alcohol Use: No    Review of Systems    Allergies  Hydralazine hcl  Home Medications   Prior to Admission medications   Medication Sig Start Date End Date Taking? Authorizing Provider  acetaminophen (TYLENOL) 500 MG tablet Take 1,500 mg by mouth every 6 (six) hours as needed for moderate pain or headache.    Historical Provider, MD  amLODipine (NORVASC) 10 MG tablet Take 10 mg by mouth daily.    Historical Provider, MD  research study medication Take 90 mg by mouth 2 (two) times daily. 04/04/13   Ripudeep Jenna Luo, MD  research study medication Take 100 mg by mouth daily. 04/04/13   Ripudeep Jenna Luo, MD  simvastatin (ZOCOR) 20 MG tablet Take 1 tablet (20 mg total) by mouth every evening. 04/04/13   Ripudeep Jenna Luo, MD  triamterene-hydrochlorothiazide (DYAZIDE) 37.5-25 MG per capsule Take 1 capsule by mouth daily.     Historical Provider, MD   BP 178/109 mmHg  Pulse 82  Temp(Src) 98.3 F (36.8 C)  Resp 20  SpO2 99% Physical Exam  Constitutional: He is oriented to person, place, and time. Vital signs are normal. He appears well-developed and well-nourished.  Non-toxic appearance. He does not appear ill. No distress.  HENT:  Head: Normocephalic and atraumatic.  Nose: Nose normal.  Mouth/Throat: Oropharynx is clear and moist.  No oropharyngeal exudate.  Eyes: Conjunctivae and EOM are normal. Pupils are equal, round, and reactive to light. No scleral icterus.  Neck: Normal range of motion. Neck supple. No tracheal deviation, no edema, no erythema and normal range of motion present. No thyroid mass and no thyromegaly present.  Cardiovascular: Normal rate, regular rhythm, S1 normal, S2 normal, normal heart sounds, intact distal pulses and normal pulses.  Exam reveals no gallop and no friction rub.   No murmur heard. Pulses:      Radial pulses are 2+ on the right side, and 2+ on the left side.       Dorsalis pedis pulses are 2+ on the right side, and 2+ on the left side.  Pulmonary/Chest:  Effort normal and breath sounds normal. No respiratory distress. He has no wheezes. He has no rhonchi. He has no rales.  Abdominal: Soft. Normal appearance and bowel sounds are normal. He exhibits no distension, no ascites and no mass. There is no hepatosplenomegaly. There is no tenderness. There is no rebound, no guarding and no CVA tenderness.  Musculoskeletal: Normal range of motion. He exhibits no edema or tenderness.  Lymphadenopathy:    He has no cervical adenopathy.  Neurological: He is alert and oriented to person, place, and time. He has normal strength. No cranial nerve deficit or sensory deficit. He exhibits normal muscle tone. GCS eye subscore is 4. GCS verbal subscore is 5. GCS motor subscore is 6.  Normal strength and sensation 4 extremities. Normal cerebella testing.  Skin: Skin is warm, dry and intact. No petechiae and no rash noted. He is not diaphoretic. No erythema. No pallor.  Psychiatric: He has a normal mood and affect. His behavior is normal. Judgment normal.  Nursing note and vitals reviewed.   ED Course  Procedures (including critical care time)  DIAGNOSTIC STUDIES: Oxygen Saturation is 99% on RA, normal by my interpretation.    COORDINATION OF CARE: 11:35 PM Discussed treatment plan with pt at bedside and pt agreed to plan.   Labs Review Labs Reviewed  CBC - Abnormal; Notable for the following:    RBC 4.17 (*)    Hemoglobin 12.3 (*)    HCT 36.3 (*)    All other components within normal limits  COMPREHENSIVE METABOLIC PANEL - Abnormal; Notable for the following:    Glucose, Bld 116 (*)    Creatinine, Ser 1.47 (*)    Alkaline Phosphatase 25 (*)    GFR calc non Af Amer 54 (*)    GFR calc Af Amer 63 (*)    All other components within normal limits  CBG MONITORING, ED - Abnormal; Notable for the following:    Glucose-Capillary 120 (*)    All other components within normal limits  PROTIME-INR  APTT  DIFFERENTIAL  URINALYSIS, ROUTINE W REFLEX MICROSCOPIC   URINE RAPID DRUG SCREEN (HOSP PERFORMED)  ETHANOL  I-STAT TROPOININ, ED    Imaging Review Dg Chest 2 View  01/25/2014   CLINICAL DATA:  Generalized weakness, recently worsened. Additional encounter.  EXAM: CHEST  2 VIEW  COMPARISON:  Prior study from 04/01/2013  FINDINGS: The cardiac and mediastinal silhouettes are stable in size and contour, and remain within normal limits.  The lungs are normally inflated. No airspace consolidation, pleural effusion, or pulmonary edema is identified. There is no pneumothorax.  Scattered degenerative changes noted within the visualized spine. No acute osseus abnormality.  IMPRESSION: No active cardiopulmonary disease.   Electronically Signed   By: Janell QuietBenjamin  McClintock M.D.  On: 01/25/2014 01:21   Ct Head (brain) Wo Contrast  01/25/2014   CLINICAL DATA:  Progressive left-sided weakness.  Headache.  EXAM: CT HEAD WITHOUT CONTRAST  TECHNIQUE: Contiguous axial images were obtained from the base of the skull through the vertex without intravenous contrast.  COMPARISON:  Head CT 11/20/2013, 03/31/2013.  Brain MRI 04/01/2013  FINDINGS: No intracranial hemorrhage, mass effect, or midline shift. No hydrocephalus. The basilar cisterns are patent. No evidence of territorial infarct. No intracranial fluid collection. No definite abnormality in the region of previous small acute infarct in the right anterior medial temporal lobe. Calvarium is intact. There is paranasal sinus mucosal thickening of the right maxillary sinus. The mastoid air cells are well aerated.  IMPRESSION: No acute intracranial abnormality.   Electronically Signed   By: Rubye Oaks M.D.   On: 01/25/2014 00:31     EKG Interpretation   Date/Time:  Thursday January 24 2014 23:35:15 EST Ventricular Rate:  80 PR Interval:  171 QRS Duration: 101 QT Interval:  371 QTC Calculation: 428 R Axis:   -28 Text Interpretation:  Sinus rhythm Borderline left axis deviation RSR' in  V1 or V2, right VCD or RVH  Confirmed by Erroll Luna [phone]) on  01/24/2014 11:49:10 PM      MDM   Final diagnoses:  None    Patient presents emergency department out of concern for stroke. Code stroke was called, Dr. Amada Jupiter with neurology has been at the bedside for evaluation. My physical exam does not reveal any gross abnormalities. CT scan is currently pending, awaiting neurology recommendations. Infectious workup pending.  Workup is negative, neurology is recommending MRI for further evaluation. This was ordered, please see oncoming provider's note for ultimate disposition. If MRI is positive patient will be admitted for further workup. If negative patient is safe for discharge.  I personally performed the services described in this documentation, which was scribed in my presence. The recorded information has been reviewed and is accurate.    Tomasita Crumble, MD 01/25/14 [phone]

## 2014-01-24 NOTE — ED Notes (Signed)
The patient said he started having a headache two days ago and today he began to have left sided weakness.  The patient advised me he just had a stroke nine months ago and did have deficits that resolved.  Today, according to the wife, he was trying to drink something and could not grip the drink. He is a G[employer]15, his right had grip is strong but he is weak in his left.  His left leg was weak as well.   He is also lethargic and having weakness in his left leg.  I advised Dr. Lynelle DoctorKnapp and she advised to call a code stroke.

## 2014-01-24 NOTE — ED Notes (Signed)
[facility]

## 2014-01-25 ENCOUNTER — Emergency Department (HOSPITAL_COMMUNITY): Payer: BLUE CROSS/BLUE SHIELD

## 2014-01-25 ENCOUNTER — Other Ambulatory Visit (HOSPITAL_COMMUNITY): Payer: BLUE CROSS/BLUE SHIELD

## 2014-01-25 DIAGNOSIS — M6289 Other specified disorders of muscle: Secondary | ICD-10-CM

## 2014-01-25 DIAGNOSIS — R51 Headache: Secondary | ICD-10-CM

## 2014-01-25 LAB — URINALYSIS, ROUTINE W REFLEX MICROSCOPIC
Bilirubin Urine: NEGATIVE
Glucose, UA: NEGATIVE mg/dL
Hgb urine dipstick: NEGATIVE
KETONES UR: NEGATIVE mg/dL
LEUKOCYTES UA: NEGATIVE
NITRITE: NEGATIVE
Protein, ur: NEGATIVE mg/dL
Specific Gravity, Urine: 1.026 (ref 1.005–1.030)
Urobilinogen, UA: 1 mg/dL (ref 0.0–1.0)
pH: 6 (ref 5.0–8.0)

## 2014-01-25 LAB — COMPREHENSIVE METABOLIC PANEL
ALT: 10 U/L (ref 0–53)
AST: 28 U/L (ref 0–37)
Albumin: 3.7 g/dL (ref 3.5–5.2)
Alkaline Phosphatase: 25 U/L — ABNORMAL LOW (ref 39–117)
Anion gap: 7 (ref 5–15)
BUN: 14 mg/dL (ref 6–23)
CHLORIDE: 106 meq/L (ref 96–112)
CO2: 26 mmol/L (ref 19–32)
Calcium: 9 mg/dL (ref 8.4–10.5)
Creatinine, Ser: 1.47 mg/dL — ABNORMAL HIGH (ref 0.50–1.35)
GFR calc Af Amer: 63 mL/min — ABNORMAL LOW (ref >90)
GFR, EST NON AFRICAN AMERICAN: 54 mL/min — AB (ref >90)
Glucose, Bld: 116 mg/dL — ABNORMAL HIGH (ref 70–99)
POTASSIUM: 4 mmol/L (ref 3.5–5.1)
Sodium: 139 mmol/L (ref 135–145)
TOTAL PROTEIN: 6.6 g/dL (ref 6.0–8.3)
Total Bilirubin: 0.5 mg/dL (ref 0.3–1.2)

## 2014-01-25 LAB — RAPID URINE DRUG SCREEN, HOSP PERFORMED
Amphetamines: NOT DETECTED
BARBITURATES: NOT DETECTED
BENZODIAZEPINES: NOT DETECTED
Cocaine: NOT DETECTED
OPIATES: NOT DETECTED
Tetrahydrocannabinol: NOT DETECTED

## 2014-01-25 LAB — ETHANOL

## 2014-01-25 MED ORDER — METOCLOPRAMIDE HCL 10 MG PO TABS
10.0000 mg | ORAL_TABLET | Freq: Once | ORAL | Status: AC
  Administered 2014-01-25: 10 mg via ORAL
  Filled 2014-01-25: qty 1

## 2014-01-25 MED ORDER — LORAZEPAM 1 MG PO TABS
2.0000 mg | ORAL_TABLET | Freq: Once | ORAL | Status: AC
  Administered 2014-01-25: 2 mg via ORAL
  Filled 2014-01-25: qty 2

## 2014-01-25 MED ORDER — IBUPROFEN 800 MG PO TABS
800.0000 mg | ORAL_TABLET | Freq: Once | ORAL | Status: AC
  Administered 2014-01-25: 800 mg via ORAL
  Filled 2014-01-25: qty 1

## 2014-01-25 NOTE — ED Notes (Signed)
This RN received a phone call from MRI, pt is unable to stay in tube, pt is freaking out. MD informed. See new orders.

## 2014-01-25 NOTE — ED Notes (Signed)
Patient transported to MRI 

## 2014-01-25 NOTE — Discharge Instructions (Signed)
We saw you in the ER for the weakness. All the results in the ER are normal, labs and imaging. We are not sure what is causing your symptoms. The workup in the ER is not complete, and is limited to screening for life threatening and emergent conditions only, so please see a primary care doctor and your neurologist for further evaluation.

## 2014-01-25 NOTE — ED Notes (Signed)
Code stroke cancelled per Neurologist.

## 2014-01-25 NOTE — ED Notes (Signed)
Neuro at [facility].

## 2014-01-25 NOTE — Consult Note (Signed)
Neurology Consultation Reason for Consult: Left Sided weakness Referring Physician: Mora Bellmanni, A  CC: Left sided weakness  History is obtained from:Patient  HPI: Kevin Jefferson is a 49 y.o. male who began having worsening of his left sided weakness around noon today. He states that his symptoms are similar to what he had with his previous stroke.  Over the course of the day he has noticed that his left side has seemed to become weaker. He also has a throbbing headache associated with photophobia. He gets these headaches from time to time.   LKW: around noon tpa given?: no, outside of window.     ROS: A 14 point ROS was performed and is negative except as noted in the HPI.   Past Medical History  Diagnosis Date  . Hypertension   . Stroke     Family History: Hypertension  Social History: Tob: Never smoker  Exam: Current vital signs: BP 129/78 mmHg  Pulse 82  Temp(Src) 97.4 F (36.3 C)  Resp 13  SpO2 98% Vital signs in last 24 hours: Temp:  [97.4 F (36.3 C)-98.3 F (36.8 C)] 97.4 F (36.3 C) (01/15 0012) Pulse Rate:  [73-86] 82 (01/15 0300) Resp:  [12-25] 13 (01/15 0300) BP: (120-178)/(77-109) 129/78 mmHg (01/15 0300) SpO2:  [96 %-100 %] 98 % (01/15 0300)   Physical Exam  Constitutional: Appears well-developed and well-nourished.  Psych: Affect appropriate to situation Eyes: No scleral injection HENT: No OP obstrucion Head: Normocephalic.  Cardiovascular: Normal rate and regular rhythm.  Respiratory: Effort normal and breath sounds normal to anterior ascultation GI: Soft.  No distension. There is no tenderness.   Neuro: Mental Status: Patient is awake, alert, oriented to person, place, month, year, and situation. Patient is able to give a clear and coherent history. No signs of aphasia or neglect Cranial Nerves: II: Visual Fields are possibly slightly decreased on the left, but he is able to see him both upper and lower field. Pupils are equal, round, and  reactive to light.   III,IV, VI: EOMI without ptosis or diploplia.  V: Facial sensation is symmetric to temperature VII: Facial movement is symmetric.  VIII: hearing is intact to voice X: Uvula elevates symmetrically XI: Shoulder shrug is symmetric. XII: tongue is midline without atrophy or fasciculations.  Motor: Tone is normal. Bulk is normal. 5/5 strength was present on the right, on the left he has 4/5 weakness of the left arm and leg. He has a downward drift without pronation. Sensory: Sensation is decreased on the left Deep Tendon Reflexes: 2+ and symmetric in the biceps and patellae.  Cerebellar: Intact finger-nose-finger         I have reviewed labs in epic and the results pertinent to this consultation are: CBC-unremarkable  I have reviewed the images obtained: CT head-no acute findings  Impression: 49 year old male with likely recrudescence of previous stroke symptoms. I would favor repeating an MRI to make sure he has not had a new ischemic event, but if negative, would treat his migraine and can be discharged home.  Recommendations: 1) MRI brain, further recommendations following this. 2) if migraine continues, could try IV Compazine 10 mg with Benadryl 25 mg   Ritta SlotMcNeill Princesa Willig, MD [facility] [phone]  If 7pm- 7am, please page neurology on call as listed in AMION.

## 2014-01-25 NOTE — ED Provider Notes (Signed)
  Physical Exam  BP 145/87 mmHg  Pulse 76  Temp(Src) 97.4 F (36.3 C)  Resp 15  SpO2 99%  Physical Exam  ED Course  Procedures  MDM  Pt in the ER for weakness/stroke like sx. Seen as code stroke. Dr. Mora Bellmanni signing pt out - MRI pending. Plan is to send pt home if MRI is neg, per neuro recs. Pts MRI is not showing any acute findings. Discussed findings. Will d/c.   Kevin KaplanAnkit Kevin Herdt, MD 01/25/14 [phone]

## 2014-04-16 ENCOUNTER — Emergency Department (HOSPITAL_BASED_OUTPATIENT_CLINIC_OR_DEPARTMENT_OTHER): Payer: BLUE CROSS/BLUE SHIELD

## 2014-04-16 ENCOUNTER — Emergency Department (HOSPITAL_BASED_OUTPATIENT_CLINIC_OR_DEPARTMENT_OTHER)
Admission: EM | Admit: 2014-04-16 | Discharge: 2014-04-17 | Disposition: A | Discharge status: Home / Self Care | Payer: BLUE CROSS/BLUE SHIELD | Attending: Emergency Medicine | Admitting: Emergency Medicine

## 2014-04-16 ENCOUNTER — Encounter (HOSPITAL_BASED_OUTPATIENT_CLINIC_OR_DEPARTMENT_OTHER): Payer: Self-pay | Admitting: Emergency Medicine

## 2014-04-16 DIAGNOSIS — Z8673 Personal history of transient ischemic attack (TIA), and cerebral infarction without residual deficits: Secondary | ICD-10-CM | POA: Diagnosis not present

## 2014-04-16 DIAGNOSIS — R0789 Other chest pain: Secondary | ICD-10-CM | POA: Insufficient documentation

## 2014-04-16 DIAGNOSIS — Z79899 Other long term (current) drug therapy: Secondary | ICD-10-CM | POA: Insufficient documentation

## 2014-04-16 DIAGNOSIS — R51 Headache: Secondary | ICD-10-CM | POA: Insufficient documentation

## 2014-04-16 DIAGNOSIS — I10 Essential (primary) hypertension: Secondary | ICD-10-CM | POA: Diagnosis not present

## 2014-04-16 DIAGNOSIS — R0602 Shortness of breath: Secondary | ICD-10-CM | POA: Diagnosis not present

## 2014-04-16 DIAGNOSIS — R040 Epistaxis: Secondary | ICD-10-CM | POA: Diagnosis not present

## 2014-04-16 DIAGNOSIS — R1110 Vomiting, unspecified: Secondary | ICD-10-CM | POA: Insufficient documentation

## 2014-04-16 DIAGNOSIS — R519 Headache, unspecified: Secondary | ICD-10-CM

## 2014-04-16 DIAGNOSIS — R079 Chest pain, unspecified: Secondary | ICD-10-CM | POA: Diagnosis present

## 2014-04-16 LAB — CBC
HEMATOCRIT: 37.3 % — AB (ref 39.0–52.0)
Hemoglobin: 12.6 g/dL — ABNORMAL LOW (ref 13.0–17.0)
MCH: 29.2 pg (ref 26.0–34.0)
MCHC: 33.8 g/dL (ref 30.0–36.0)
MCV: 86.5 fL (ref 78.0–100.0)
Platelets: 206 10*3/uL (ref 150–400)
RBC: 4.31 MIL/uL (ref 4.22–5.81)
RDW: 12.6 % (ref 11.5–15.5)
WBC: 8.5 10*3/uL (ref 4.0–10.5)

## 2014-04-16 LAB — BASIC METABOLIC PANEL
ANION GAP: 8 (ref 5–15)
BUN: 16 mg/dL (ref 6–23)
CHLORIDE: 100 mmol/L (ref 96–112)
CO2: 30 mmol/L (ref 19–32)
CREATININE: 1.45 mg/dL — AB (ref 0.50–1.35)
Calcium: 9.6 mg/dL (ref 8.4–10.5)
GFR calc non Af Amer: 55 mL/min — ABNORMAL LOW (ref >90)
GFR, EST AFRICAN AMERICAN: 64 mL/min — AB (ref >90)
Glucose, Bld: 110 mg/dL — ABNORMAL HIGH (ref 70–99)
Potassium: 4 mmol/L (ref 3.5–5.1)
Sodium: 138 mmol/L (ref 135–145)

## 2014-04-16 LAB — TROPONIN I: Troponin I: <0.03 ng/mL (ref <0.031)

## 2014-04-16 MED ORDER — ASPIRIN 81 MG PO CHEW
324.0000 mg | CHEWABLE_TABLET | Freq: Once | ORAL | Status: AC
  Administered 2014-04-16: 324 mg via ORAL
  Filled 2014-04-16: qty 4

## 2014-04-16 MED ORDER — SODIUM CHLORIDE 0.9 % IV SOLN
1000.0000 mL | Freq: Once | INTRAVENOUS | Status: AC
  Administered 2014-04-16: 1000 mL via INTRAVENOUS

## 2014-04-16 MED ORDER — SODIUM CHLORIDE 0.9 % IV SOLN: 1000.0000 mL | INTRAVENOUS | Status: DC

## 2014-04-16 MED ORDER — METOCLOPRAMIDE HCL 5 MG/ML IJ SOLN
10.0000 mg | Freq: Once | INTRAMUSCULAR | Status: AC
  Administered 2014-04-16: 10 mg via INTRAVENOUS
  Filled 2014-04-16: qty 2

## 2014-04-16 MED ORDER — DIPHENHYDRAMINE HCL 50 MG/ML IJ SOLN
25.0000 mg | Freq: Once | INTRAMUSCULAR | Status: AC
  Administered 2014-04-16: 25 mg via INTRAVENOUS
  Filled 2014-04-16: qty 1

## 2014-04-16 NOTE — ED Notes (Signed)
Patient states that about 30 minutes ago he started to have new onset chest pain became real hot and SOB. The patient reports that he became dizzy and threw up. The patient also reports that he now still have a "real bad headache"

## 2014-04-16 NOTE — ED Provider Notes (Signed)
CSN: [id number]     Arrival date & time 04/16/14  2204 History  This chart was scribed for Kevin Booze, MD by Gwenyth Ober, ED Scribe. This patient was seen in room MH03/MH03 and the patient's care was started at 11:11 PM.    Chief Complaint  Patient presents with  . Chest Pain  . Headache   The history is provided by the patient. No language interpreter was used.    HPI Comments: Kevin Jefferson is a 49 y.o. male with a history of HTN who presents to the Emergency Department complaining of constant, gradually improving, throbbing right-sided HA that started 2 hours ago. He reports pain initially was 10/10, but has improved to 3/10 without treatment. Pt states pressure-like CP that lasted 10 minutes and accompanied onset of HA as an associated symptom. He also notes 1 episode of epistaxis, a choking sensation and SOB that lasted a few minutes, but are currently resolved. Pt took 2 Bayer this morning. Pt has a history of similar HAs, but has not been evaluated for symptoms. He denies tobacco use and a family history of CAD. He also denies weakness, numbness and tingling as associated symptoms.   PCP [name]  Past Medical History  Diagnosis Date  . Hypertension   . Stroke    Past Surgical History  Procedure Laterality Date  . Tee without cardioversion N/A 04/03/2013    Procedure: TRANSESOPHAGEAL ECHOCARDIOGRAM (TEE);  Surgeon: Lewayne Bunting, MD;  Location: [facility] ENDOSCOPY;  Service: Cardiovascular;  Laterality: N/A;   Family History  Problem Relation Age of Onset  . Hypertension Mother   . Hypertension Father   . Irregular heart beat Maternal Grandmother    History  Substance Use Topics  . Smoking status: Never Smoker   . Smokeless tobacco: Not on file  . Alcohol Use: No    Review of Systems  HENT: Positive for nosebleeds.   Respiratory: Positive for shortness of breath.   Cardiovascular: Positive for chest pain.  Gastrointestinal: Positive for vomiting.  Neurological: Positive  for headaches. Negative for weakness and numbness.  All other systems reviewed and are negative.     Allergies  Hydralazine hcl  Home Medications   Prior to Admission medications   Medication Sig Start Date End Date Taking? Authorizing Provider  acetaminophen (TYLENOL) 500 MG tablet Take 1,500 mg by mouth every 6 (six) hours as needed for moderate pain or headache.    Historical Provider, MD  amLODipine (NORVASC) 10 MG tablet Take 10 mg by mouth daily.    Historical Provider, MD  lisinopril-hydrochlorothiazide Marcell Anger) 20-12.5 MG per tablet  12/10/13   Historical Provider, MD  research study medication Take 90 mg by mouth 2 (two) times daily. Patient not taking: Reported on 01/25/2014 04/04/13   Ripudeep Jenna Luo, MD  research study medication Take 100 mg by mouth daily. Patient not taking: Reported on 01/25/2014 04/04/13   Ripudeep Jenna Luo, MD  simvastatin (ZOCOR) 20 MG tablet Take 1 tablet (20 mg total) by mouth every evening. 04/04/13   Ripudeep Jenna Luo, MD  triamterene-hydrochlorothiazide (DYAZIDE) 37.5-25 MG per capsule Take 1 capsule by mouth daily.     Historical Provider, MD   BP 139/88 mmHg  Pulse 82  Temp(Src) 98.2 F (36.8 C) (Oral)  Resp 17  Ht  (1.803 m)  Wt 180 lb (81.647 kg)  BMI 25.12 kg/m2  SpO2 100% Physical Exam  Constitutional: He is oriented to person, place, and time. He appears well-developed and well-nourished. No distress.  HENT:  Head: Normocephalic and atraumatic.  Eyes: Conjunctivae and EOM are normal. Pupils are equal, round, and reactive to light.  Fundi were normal  Neck: Normal range of motion. Neck supple. No JVD present.  Cardiovascular: Normal rate, regular rhythm and normal heart sounds.   No murmur heard. Pulmonary/Chest: Effort normal and breath sounds normal. He has no wheezes. He has no rales. He exhibits no tenderness.  Abdominal: Soft. Bowel sounds are normal. He exhibits no distension and no mass. There is no tenderness.   Musculoskeletal: Normal range of motion. He exhibits no edema.  Lymphadenopathy:    He has no cervical adenopathy.  Neurological: He is alert and oriented to person, place, and time. No cranial nerve deficit. He exhibits normal muscle tone. Coordination normal.  Skin: Skin is warm and dry. No rash noted.  Psychiatric: He has a normal mood and affect. His behavior is normal. Judgment and thought content normal.  Nursing note and vitals reviewed.   ED Course  Procedures   DIAGNOSTIC STUDIES: Oxygen Saturation is 100% on RA, normal by my interpretation.    COORDINATION OF CARE: 11:19 PM Discussed treatment plan with pt which includes repeat EKG and lab work. Pt agreed to plan.   Labs Review Results for orders placed or performed during the hospital encounter of 04/16/14  CBC  Result Value Ref Range   WBC 8.5 4.0 - 10.5 K/uL   RBC 4.31 4.22 - 5.81 MIL/uL   Hemoglobin 12.6 (L) 13.0 - 17.0 g/dL   HCT 45.437.3 (L) 09.839.0 - 11.952.0 %   MCV 86.5 78.0 - 100.0 fL   MCH 29.2 26.0 - 34.0 pg   MCHC 33.8 30.0 - 36.0 g/dL   RDW 14.712.6 82.911.5 - 56.215.5 %   Platelets 206 150 - 400 K/uL  Basic metabolic panel  Result Value Ref Range   Sodium 138 135 - 145 mmol/L   Potassium 4.0 3.5 - 5.1 mmol/L   Chloride 100 96 - 112 mmol/L   CO2 30 19 - 32 mmol/L   Glucose, Bld 110 (H) 70 - 99 mg/dL   BUN 16 6 - 23 mg/dL   Creatinine, Ser 1.301.45 (H) 0.50 - 1.35 mg/dL   Calcium 9.6 8.4 - 86.510.5 mg/dL   GFR calc non Af Amer 55 (L) >90 mL/min   GFR calc Af Amer 64 (L) >90 mL/min   Anion gap 8 5 - 15  Troponin I (MHP)  Result Value Ref Range   Troponin I <0.03 <0.031 ng/mL   Imaging Review Dg Chest 2 View  04/16/2014   CLINICAL DATA:  Central chest pain and shortness of breath onset 30 minutes ago.  EXAM: CHEST  2 VIEW  COMPARISON:  01/25/2014  FINDINGS: The heart size and mediastinal contours are within normal limits. Both lungs are clear. The visualized skeletal structures are unremarkable.  IMPRESSION: No active  cardiopulmonary disease.   Electronically Signed   By: Burman NievesWilliam  Stevens M.D.   On: 04/16/2014 22:40   Ct Head Wo Contrast  04/16/2014   CLINICAL DATA:  Headache, chest pain, nausea, vomiting, and shortness of breath beginning about 30 minutes ago. History of stroke.  EXAM: CT HEAD WITHOUT CONTRAST  TECHNIQUE: Contiguous axial images were obtained from the base of the skull through the vertex without intravenous contrast.  COMPARISON:  MRI brain 01/25/2014.  CT head 01/24/2014.  FINDINGS: Ventricles and sulci are symmetrical. No mass effect or midline shift. No abnormal extra-axial fluid collections. Gray-white matter junctions are distinct. Basal cisterns  are not effaced. No evidence of acute intracranial hemorrhage. No depressed skull fractures. Visualized paranasal sinuses and mastoid air cells are not opacified.  IMPRESSION: No acute intracranial abnormalities.   Electronically Signed   By: Burman Nieves M.D.   On: 04/16/2014 22:44     EKG Interpretation   Date/Time:  Tuesday April 16 2014 22:19:01 EDT Ventricular Rate:  80 PR Interval:  170 QRS Duration: 98 QT Interval:  376 QTC Calculation: 433 R Axis:   -26 Text Interpretation:  Normal sinus rhythm Minimal voltage criteria for  LVH, may be normal variant Borderline ECG J point elevation in lateral  leads When compared with ECG of 01/24/2014, J point elevation  is now  Present Confirmed by [facility]  MD, Noeli Lavery ([id number]) on 04/16/2014 11:07:13 PM       EKG Interpretation  Date/Time:  Tuesday April 16 2014 23:52:55 EDT Ventricular Rate:  87 PR Interval:  164 QRS Duration: 98 QT Interval:  366 QTC Calculation: 440 R Axis:   -22 Text Interpretation:  Normal sinus rhythm Cannot rule out Anterior infarct , age undetermined Abnormal ECG When compared with ECG of 04/16/2014, J point elevation  is no longer Present Confirmed by [facility]  MD, Calil Amor ([id number]) on 04/17/2014 12:04:39 AM       MDM   Final diagnoses:  Headache, unspecified headache  type  Atypical chest pain    Headache with characteristics of both migraines and muscle contraction headache. Chest pain appears atypical. Troponin is normal. Initial ECG had some J-point elevation in the lateral leads in a pattern that was more typical of early repolarization, but had not been present on prior ECG. ECG is repeated and J-point elevation is no longer present. He had excellent relief of his headache with a migraine cocktail of fluids, metoclopramide, diphenhydramine. He is discharged in good condition.  I personally performed the services described in this documentation, which was scribed in my presence. The recorded information has been reviewed and is accurate.      Kevin Booze, MD 04/17/14 [phone]

## 2014-04-16 NOTE — ED Notes (Signed)
Pt is in CT at this time.

## 2014-04-17 NOTE — Discharge Instructions (Signed)
General Headache Without Cause °A headache is pain or discomfort felt around the head or neck area. The specific cause of a headache may not be found. There are many causes and types of headaches. A few common ones are: °· Tension headaches. °· Migraine headaches. °· Cluster headaches. °· Chronic daily headaches. °HOME CARE INSTRUCTIONS  °· Keep all follow-up appointments with your caregiver or any specialist referral. °· Only take over-the-counter or prescription medicines for pain or discomfort as directed by your caregiver. °· Lie down in a dark, quiet room when you have a headache. °· Keep a headache journal to find out what may trigger your migraine headaches. For example, write down: °¨ What you eat and drink. °¨ How much sleep you get. °¨ Any change to your diet or medicines. °· Try massage or other relaxation techniques. °· Put ice packs or heat on the head and neck. Use these 3 to 4 times per day for 15 to 20 minutes each time, or as needed. °· Limit stress. °· Sit up straight, and do not tense your muscles. °· Quit smoking if you smoke. °· Limit alcohol use. °· Decrease the amount of caffeine you drink, or stop drinking caffeine. °· Eat and sleep on a regular schedule. °· Get 7 to 9 hours of sleep, or as recommended by your caregiver. °· Keep lights dim if bright lights bother you and make your headaches worse. °SEEK MEDICAL CARE IF:  °· You have problems with the medicines you were prescribed. °· Your medicines are not working. °· You have a change from the usual headache. °· You have nausea or vomiting. °SEEK IMMEDIATE MEDICAL CARE IF:  °· Your headache becomes severe. °· You have a fever. °· You have a stiff neck. °· You have loss of vision. °· You have muscular weakness or loss of muscle control. °· You start losing your balance or have trouble walking. °· You feel faint or pass out. °· You have severe symptoms that are different from your first symptoms. °MAKE SURE YOU:  °· Understand these  instructions. °· Will watch your condition. °· Will get help right away if you are not doing well or get worse. °Document Released: 12/28/2004 Document Revised: 03/22/2011 Document Reviewed: 01/13/2011 °ExitCare® Patient Information ©2015 [employer]. This information is not intended to replace advice given to you by your health care provider. Make sure you discuss any questions you have with your health care provider. ° ° °Chest Pain (Nonspecific) °It is often hard to give a specific diagnosis for the cause of chest pain. There is always a chance that your pain could be related to something serious, such as a heart attack or a blood clot in the lungs. You need to follow up with your health care provider for further evaluation. °CAUSES  °· Heartburn. °· Pneumonia or bronchitis. °· Anxiety or stress. °· Inflammation around your heart (pericarditis) or lung (pleuritis or pleurisy). °· A blood clot in the lung. °· A collapsed lung (pneumothorax). It can develop suddenly on its own (spontaneous pneumothorax) or from trauma to the chest. °· Shingles infection (herpes zoster virus). °The chest wall is composed of bones, muscles, and cartilage. Any of these can be the source of the pain. °· The bones can be bruised by injury. °· The muscles or cartilage can be strained by coughing or overwork. °· The cartilage can be affected by inflammation and become sore (costochondritis). °DIAGNOSIS  °Lab tests or other studies may be needed to find the cause   of your pain. Your health care provider may have you take a test called an ambulatory electrocardiogram (ECG). An ECG records your heartbeat patterns over a 24-hour period. You may also have other tests, such as: °· Transthoracic echocardiogram (TTE). During echocardiography, sound waves are used to evaluate how blood flows through your heart. °· Transesophageal echocardiogram (TEE). °· Cardiac monitoring. This allows your health care provider to monitor your heart rate and  rhythm in real time. °· Holter monitor. This is a portable device that records your heartbeat and can help diagnose heart arrhythmias. It allows your health care provider to track your heart activity for several days, if needed. °· Stress tests by exercise or by giving medicine that makes the heart beat faster. °TREATMENT  °· Treatment depends on what may be causing your chest pain. Treatment may include: °¨ Acid blockers for heartburn. °¨ Anti-inflammatory medicine. °¨ Pain medicine for inflammatory conditions. °¨ Antibiotics if an infection is present. °· You may be advised to change lifestyle habits. This includes stopping smoking and avoiding alcohol, caffeine, and chocolate. °· You may be advised to keep your head raised (elevated) when sleeping. This reduces the chance of acid going backward from your stomach into your esophagus. °Most of the time, nonspecific chest pain will improve within 2-3 days with rest and mild pain medicine.  °HOME CARE INSTRUCTIONS  °· If antibiotics were prescribed, take them as directed. Finish them even if you start to feel better. °· For the next few days, avoid physical activities that bring on chest pain. Continue physical activities as directed. °· Do not use any tobacco products, including cigarettes, chewing tobacco, or electronic cigarettes. °· Avoid drinking alcohol. °· Only take medicine as directed by your health care provider. °· Follow your health care provider's suggestions for further testing if your chest pain does not go away. °· Keep any follow-up appointments you made. If you do not go to an appointment, you could develop lasting (chronic) problems with pain. If there is any problem keeping an appointment, call to reschedule. °SEEK MEDICAL CARE IF:  °· Your chest pain does not go away, even after treatment. °· You have a rash with blisters on your chest. °· You have a fever. °SEEK IMMEDIATE MEDICAL CARE IF:  °· You have increased chest pain or pain that spreads to  your arm, neck, jaw, back, or abdomen. °· You have shortness of breath. °· You have an increasing cough, or you cough up blood. °· You have severe back or abdominal pain. °· You feel nauseous or vomit. °· You have severe weakness. °· You faint. °· You have chills. °This is an emergency. Do not wait to see if the pain will go away. Get medical help at once. Call your local emergency services (911 in [place] Do not drive yourself to the hospital. °MAKE SURE YOU:  °· Understand these instructions. °· Will watch your condition. °· Will get help right away if you are not doing well or get worse. °Document Released: 10/07/2004 Document Revised: 01/02/2013 Document Reviewed: 08/03/2007 °ExitCare® Patient Information ©2015 [employer]. This information is not intended to replace advice given to you by your health care provider. Make sure you discuss any questions you have with your health care provider. ° ° °

## 2014-05-22 ENCOUNTER — Other Ambulatory Visit: Payer: Self-pay

## 2014-05-22 ENCOUNTER — Emergency Department (HOSPITAL_BASED_OUTPATIENT_CLINIC_OR_DEPARTMENT_OTHER)
Admission: EM | Admit: 2014-05-22 | Discharge: 2014-05-22 | Disposition: A | Discharge status: Home / Self Care | Payer: BLUE CROSS/BLUE SHIELD | Attending: Emergency Medicine | Admitting: Emergency Medicine

## 2014-05-22 ENCOUNTER — Encounter (HOSPITAL_BASED_OUTPATIENT_CLINIC_OR_DEPARTMENT_OTHER): Payer: Self-pay | Admitting: *Deleted

## 2014-05-22 DIAGNOSIS — Z8673 Personal history of transient ischemic attack (TIA), and cerebral infarction without residual deficits: Secondary | ICD-10-CM | POA: Insufficient documentation

## 2014-05-22 DIAGNOSIS — I10 Essential (primary) hypertension: Secondary | ICD-10-CM | POA: Insufficient documentation

## 2014-05-22 DIAGNOSIS — M542 Cervicalgia: Secondary | ICD-10-CM

## 2014-05-22 DIAGNOSIS — R6884 Jaw pain: Secondary | ICD-10-CM | POA: Insufficient documentation

## 2014-05-22 DIAGNOSIS — R220 Localized swelling, mass and lump, head: Secondary | ICD-10-CM | POA: Diagnosis not present

## 2014-05-22 DIAGNOSIS — R51 Headache: Secondary | ICD-10-CM | POA: Diagnosis not present

## 2014-05-22 DIAGNOSIS — Z79899 Other long term (current) drug therapy: Secondary | ICD-10-CM | POA: Diagnosis not present

## 2014-05-22 NOTE — ED Notes (Signed)
D/c home with friend to drive

## 2014-05-22 NOTE — ED Provider Notes (Signed)
CSN: [id number]     Arrival date & time 05/22/14  1811 History   First MD Initiated Contact with Patient 05/22/14 1834     This chart was scribed for Kevin Rhineonald Ashleigh Arya, MD by Arlan OrganAshley Leger, ED Scribe. This patient was seen in room MH01/MH01 and the patient's care was started 6:38 PM.   Chief Complaint  Patient presents with  . Neck Pain   Patient is a 49 y.o. male presenting with neck pain. The history is provided by the patient. No language interpreter was used.  Neck Pain Pain location:  L side Pain radiates to:  L arm Pain severity:  Moderate Pain is:  Unable to specify Onset quality:  Gradual Duration:  1 day Timing:  Constant Progression:  Worsening Chronicity:  New Context: not fall, not lifting a heavy object and not recent injury   Relieved by:  Ice Worsened by:  Nothing tried Associated symptoms: no chest pain, no fever, no numbness and no weakness   Risk factors: no recent head injury and no recurrent falls     HPI Comments: Kevin Jefferson is a 49 y.o. male with a PMHx who presents to the Emergency Department complaining of constant, ongoing, progressively worsening L sided neck pain that radiates down the L arm x 1 day. Pt also reports mild L sided jaw pain and L sided facial swelling  noted while at a restaurant. Pt mentions mild HA that has now resolved. 3 doses of OTC Tylenol attempted prior to arrival with mild improvement for symptoms.No recent abdominal pain, CP, SOB, fever, or chills. No weakness in arms or legs. No head trauma or LOC. He denies any recent injuries. Pt with known allergy to Hydralazine HCL.  He reports his pastor was present during this episode and reported to him his face was "drooping"   Past Medical History  Diagnosis Date  . Hypertension   . Stroke    Past Surgical History  Procedure Laterality Date  . Tee without cardioversion N/A 04/03/2013    Procedure: TRANSESOPHAGEAL ECHOCARDIOGRAM (TEE);  Surgeon: Lewayne BuntingBrian S Crenshaw, MD;  [facility];  Service: Cardiovascular;  Laterality: N/A;   Family History  Problem Relation Age of Onset  . Hypertension Mother   . Hypertension Father   . Irregular heart beat Maternal Grandmother    History  Substance Use Topics  . Smoking status: Never Smoker   . Smokeless tobacco: Not on file  . Alcohol Use: No    Review of Systems  Constitutional: Negative for fever and chills.  Respiratory: Negative for shortness of breath.   Cardiovascular: Negative for chest pain.  Gastrointestinal: Negative for abdominal pain.  Musculoskeletal: Positive for neck pain.  Neurological: Negative for weakness and numbness.  All other systems reviewed and are negative.     Allergies  Hydralazine hcl  Home Medications   Prior to Admission medications   Medication Sig Start Date End Date Taking? Authorizing Provider  acetaminophen (TYLENOL) 500 MG tablet Take 1,500 mg by mouth every 6 (six) hours as needed for moderate pain or headache.    Historical Provider, MD  amLODipine (NORVASC) 10 MG tablet Take 10 mg by mouth daily.    Historical Provider, MD  lisinopril-hydrochlorothiazide Marcell Anger(PRINZIDE,ZESTORETIC) 20-12.5 MG per tablet  12/10/13   Historical Provider, MD  research study medication Take 90 mg by mouth 2 (two) times daily. Patient not taking: Reported on 01/25/2014 04/04/13   Ripudeep Jenna LuoK Rai, MD  research study medication Take 100 mg by mouth daily. Patient  not taking: Reported on 01/25/2014 04/04/13   Ripudeep Jenna LuoK Rai, MD  simvastatin (ZOCOR) 20 MG tablet Take 1 tablet (20 mg total) by mouth every evening. 04/04/13   Ripudeep Jenna LuoK Rai, MD  triamterene-hydrochlorothiazide (DYAZIDE) 37.5-25 MG per capsule Take 1 capsule by mouth daily.     Historical Provider, MD   Triage Vitals: BP 175/104 mmHg  Pulse 84  Temp(Src) 98.3 F (36.8 C) (Oral)  Resp 18  SpO2 100%   Physical Exam  CONSTITUTIONAL: Well developed/well nourished HEAD: Normocephalic/atraumatic EYES: EOMI/PERRL, no nystagmus, no  ptosis ENMT: Mucous membranes moist, no facial swelling.  No angioedema.  Mild facial tenderness but no dental tenderness NECK: supple no meningeal signs, no bruits, no anterior neck swelling noted Spine - tenderness to left lateral neck, no midline tenderness CV: S1/S2 noted, no murmurs/rubs/gallops noted LUNGS: Lungs are clear to auscultation bilaterally, no apparent distress ABDOMEN: soft, nontender, no rebound or guarding GU:no cva tenderness NEURO:Awake/alert, facies symmetric, no arm or leg drift is noted Equal 5/5 strength with shoulder abduction, elbow flex/extension, wrist flex/extension in upper extremities and equal hand grips bilaterally Equal 5/5 strength with hip flexion,knee flex/extension, foot dorsi/plantar flexion Cranial nerves 3/4/5/6/07/19/08/11/12 tested and intact Gait normal without ataxia No past pointing Sensation to light touch intact in all extremities EXTREMITIES: pulses normal, full ROM SKIN: warm, color normal PSYCH: no abnormalities of mood noted   ED Course  Procedures   DIAGNOSTIC STUDIES: Oxygen Saturation is 100% on RA, Normal by my interpretation.    COORDINATION OF CARE: 6:53 PM- Will order EKG. Discussed treatment plan with pt at bedside and pt agreed to plan.      7:17 PM  His friend/pastor is now at bedside He denies noticing any facial droop or arm weakness He reports his face was swollen and he was rubbing his eyes He had no drooling He had no confusion I doubt this represents CVA He has h/o previous stroke but no residual deficits Discussed strict return precautions    EKG Interpretation   Date/Time:  Wednesday May 22 2014 18:19:56 EDT Ventricular Rate:  82 PR Interval:  166 QRS Duration: 96 QT Interval:  370 QTC Calculation: 432 R Axis:   -14 Text Interpretation:  Normal sinus rhythm Incomplete right bundle branch  block Borderline ECG No significant change since last tracing Confirmed by  Bebe ShaggyWICKLINE  MD, Dorinda HillNALD ([id number]) on  05/22/2014 6:35:40 PM      MDM   Final diagnoses:  Essential hypertension  Neck pain    Nursing notes including past medical history and social history reviewed and considered in documentation Previous records reviewed and considered    I personally performed the services described in this documentation, which was scribed in my presence. The recorded information has been reviewed and is accurate.      Kevin Rhineonald Ferris Tally, MD 05/22/14 1919

## 2014-05-22 NOTE — ED Notes (Addendum)
Pt c/o left neck/ jaw pain which radiates down left arm and SOB  x 1 days HX of stroke 14 months ago

## 2014-05-22 NOTE — ED Notes (Signed)
MD at bedside. 

## 2014-05-22 NOTE — Discharge Instructions (Signed)
   SEEK IMMEDIATE MEDICAL ATTENTION IF: You develop difficulties swallowing or breathing.  You have new or worse numbness, weakness, tingling, or movement problems in your arms or legs.  You develop increasing pain which is uncontrolled with medications.  You have change in bowel or bladder function, or other concerns.  

## 2014-10-07 ENCOUNTER — Emergency Department (HOSPITAL_BASED_OUTPATIENT_CLINIC_OR_DEPARTMENT_OTHER)
Admission: EM | Admit: 2014-10-07 | Discharge: 2014-10-07 | Disposition: A | Discharge status: Home / Self Care | Payer: BLUE CROSS/BLUE SHIELD | Attending: Physician Assistant | Admitting: Physician Assistant

## 2014-10-07 ENCOUNTER — Encounter (HOSPITAL_BASED_OUTPATIENT_CLINIC_OR_DEPARTMENT_OTHER): Payer: Self-pay | Admitting: *Deleted

## 2014-10-07 ENCOUNTER — Emergency Department (HOSPITAL_BASED_OUTPATIENT_CLINIC_OR_DEPARTMENT_OTHER): Payer: BLUE CROSS/BLUE SHIELD

## 2014-10-07 DIAGNOSIS — R531 Weakness: Secondary | ICD-10-CM | POA: Insufficient documentation

## 2014-10-07 DIAGNOSIS — G43809 Other migraine, not intractable, without status migrainosus: Secondary | ICD-10-CM | POA: Insufficient documentation

## 2014-10-07 DIAGNOSIS — I10 Essential (primary) hypertension: Secondary | ICD-10-CM | POA: Diagnosis not present

## 2014-10-07 DIAGNOSIS — Z79899 Other long term (current) drug therapy: Secondary | ICD-10-CM | POA: Insufficient documentation

## 2014-10-07 DIAGNOSIS — Z8673 Personal history of transient ischemic attack (TIA), and cerebral infarction without residual deficits: Secondary | ICD-10-CM | POA: Insufficient documentation

## 2014-10-07 DIAGNOSIS — R51 Headache: Secondary | ICD-10-CM | POA: Diagnosis present

## 2014-10-07 DIAGNOSIS — R200 Anesthesia of skin: Secondary | ICD-10-CM | POA: Diagnosis not present

## 2014-10-07 LAB — DIFFERENTIAL
BASOS ABS: 0 10*3/uL (ref 0.0–0.1)
Basophils Relative: 0 %
EOS ABS: 0.1 10*3/uL (ref 0.0–0.7)
EOS PCT: 1 %
Lymphocytes Relative: 30 %
Lymphs Abs: 2.1 10*3/uL (ref 0.7–4.0)
Monocytes Absolute: 0.6 10*3/uL (ref 0.1–1.0)
Monocytes Relative: 8 %
NEUTROS PCT: 61 %
Neutro Abs: 4.1 10*3/uL (ref 1.7–7.7)

## 2014-10-07 LAB — CBC
HCT: 38.1 % — ABNORMAL LOW (ref 39.0–52.0)
Hemoglobin: 12.7 g/dL — ABNORMAL LOW (ref 13.0–17.0)
MCH: 28.7 pg (ref 26.0–34.0)
MCHC: 33.3 g/dL (ref 30.0–36.0)
MCV: 86 fL (ref 78.0–100.0)
PLATELETS: 200 10*3/uL (ref 150–400)
RBC: 4.43 MIL/uL (ref 4.22–5.81)
RDW: 13.2 % (ref 11.5–15.5)
WBC: 6.9 10*3/uL (ref 4.0–10.5)

## 2014-10-07 LAB — URINALYSIS, ROUTINE W REFLEX MICROSCOPIC
Bilirubin Urine: NEGATIVE
Glucose, UA: NEGATIVE mg/dL
Hgb urine dipstick: NEGATIVE
KETONES UR: NEGATIVE mg/dL
LEUKOCYTES UA: NEGATIVE
NITRITE: NEGATIVE
PH: 5.5 (ref 5.0–8.0)
Protein, ur: NEGATIVE mg/dL
SPECIFIC GRAVITY, URINE: 1.005 (ref 1.005–1.030)
Urobilinogen, UA: 0.2 mg/dL (ref 0.0–1.0)

## 2014-10-07 LAB — APTT: APTT: 29 s (ref 24–37)

## 2014-10-07 LAB — COMPREHENSIVE METABOLIC PANEL
ALBUMIN: 4.2 g/dL (ref 3.5–5.0)
ALT: 14 U/L — ABNORMAL LOW (ref 17–63)
ANION GAP: 8 (ref 5–15)
AST: 17 U/L (ref 15–41)
Alkaline Phosphatase: 23 U/L — ABNORMAL LOW (ref 38–126)
BUN: 24 mg/dL — ABNORMAL HIGH (ref 6–20)
CHLORIDE: 100 mmol/L — AB (ref 101–111)
CO2: 28 mmol/L (ref 22–32)
Calcium: 9.4 mg/dL (ref 8.9–10.3)
Creatinine, Ser: 1.98 mg/dL — ABNORMAL HIGH (ref 0.61–1.24)
GFR calc Af Amer: 44 mL/min — ABNORMAL LOW (ref >60)
GFR calc non Af Amer: 38 mL/min — ABNORMAL LOW (ref >60)
GLUCOSE: 105 mg/dL — AB (ref 65–99)
POTASSIUM: 3.5 mmol/L (ref 3.5–5.1)
SODIUM: 136 mmol/L (ref 135–145)
Total Bilirubin: 0.6 mg/dL (ref 0.3–1.2)
Total Protein: 7.9 g/dL (ref 6.5–8.1)

## 2014-10-07 LAB — PROTIME-INR
INR: 0.98 (ref 0.00–1.49)
PROTHROMBIN TIME: 13.2 s (ref 11.6–15.2)

## 2014-10-07 MED ORDER — DEXAMETHASONE SODIUM PHOSPHATE 10 MG/ML IJ SOLN
10.0000 mg | Freq: Once | INTRAMUSCULAR | Status: AC
  Administered 2014-10-07: 10 mg via INTRAVENOUS
  Filled 2014-10-07: qty 1

## 2014-10-07 MED ORDER — DIPHENHYDRAMINE HCL 50 MG/ML IJ SOLN
25.0000 mg | Freq: Once | INTRAMUSCULAR | Status: AC
  Administered 2014-10-07: 25 mg via INTRAVENOUS
  Filled 2014-10-07: qty 1

## 2014-10-07 MED ORDER — METOCLOPRAMIDE HCL 5 MG/ML IJ SOLN
10.0000 mg | Freq: Once | INTRAMUSCULAR | Status: AC
  Administered 2014-10-07: 10 mg via INTRAVENOUS
  Filled 2014-10-07: qty 2

## 2014-10-07 NOTE — ED Provider Notes (Signed)
4:16 PM I assessed patient. He feels back to normal. He said he feels 100% back to normal. His family would like to take him home now, we will allow that.  Courteney Randall An, MD 10/07/14 [phone]

## 2014-10-07 NOTE — ED Notes (Signed)
Left sided weakness since 10am while driving. He drank gatorade and thought symptoms would improve. Hx of stroke in 2015.

## 2014-10-07 NOTE — ED Provider Notes (Signed)
CSN: [id number]     Arrival date & time 10/07/14  1412 History   First MD Initiated Contact with Patient 10/07/14 1444     Chief Complaint  Patient presents with  . Numbness     (Consider location/radiation/quality/duration/timing/severity/associated sxs/prior Treatment) Patient is a 49 y.o. male presenting with headaches. The history is provided by the patient.  Headache Pain location:  Generalized Quality:  Dull Radiates to:  Does not radiate Onset quality:  Gradual Duration:  1 day Timing:  Constant Progression:  Unchanged Chronicity:  Recurrent Similar to prior headaches: yes   Context: activity   Relieved by:  Nothing Worsened by:  Nothing Ineffective treatments:  None tried Associated symptoms: paresthesias (on left side of face ) and weakness (worsening on left with baseline deficits from prior stroke )   Associated symptoms: no abdominal pain     Past Medical History  Diagnosis Date  . Hypertension   . Stroke    Past Surgical History  Procedure Laterality Date  . Tee without cardioversion N/A 04/03/2013    Procedure: TRANSESOPHAGEAL ECHOCARDIOGRAM (TEE);  Surgeon: Lewayne Bunting, MD;  Location: [facility] ENDOSCOPY;  Service: Cardiovascular;  Laterality: N/A;   Family History  Problem Relation Age of Onset  . Hypertension Mother   . Hypertension Father   . Irregular heart beat Maternal Grandmother    Social History  Substance Use Topics  . Smoking status: Never Smoker   . Smokeless tobacco: None  . Alcohol Use: No    Review of Systems  Gastrointestinal: Negative for abdominal pain.  Neurological: Positive for weakness (worsening on left with baseline deficits from prior stroke ), headaches and paresthesias (on left side of face ).  All other systems reviewed and are negative.     Allergies  Hydralazine hcl  Home Medications   Prior to Admission medications   Medication Sig Start Date End Date Taking? Authorizing Provider  acetaminophen (TYLENOL) 500  MG tablet Take 1,500 mg by mouth every 6 (six) hours as needed for moderate pain or headache.    Historical Provider, MD  amLODipine (NORVASC) 10 MG tablet Take 10 mg by mouth daily.    Historical Provider, MD  lisinopril-hydrochlorothiazide Marcell Anger) 20-12.5 MG per tablet  12/10/13   Historical Provider, MD  research study medication Take 90 mg by mouth 2 (two) times daily. Patient not taking: Reported on 01/25/2014 04/04/13   Ripudeep Jenna Luo, MD  research study medication Take 100 mg by mouth daily. Patient not taking: Reported on 01/25/2014 04/04/13   Ripudeep Jenna Luo, MD  simvastatin (ZOCOR) 20 MG tablet Take 1 tablet (20 mg total) by mouth every evening. 04/04/13   Ripudeep Jenna Luo, MD  triamterene-hydrochlorothiazide (DYAZIDE) 37.5-25 MG per capsule Take 1 capsule by mouth daily.     Historical Provider, MD   BP 148/89 mmHg  Pulse 75  Temp(Src) 98.6 F (37 C) (Oral)  Resp 14  Ht  (1.803 m)  Wt 190 lb (86.183 kg)  BMI 26.51 kg/m2  SpO2 98% Physical Exam  Constitutional: He is oriented to person, place, and time. He appears well-developed and well-nourished. No distress.  HENT:  Head: Normocephalic and atraumatic.  No facial asymmetry  Eyes: Conjunctivae are normal.  Neck: Neck supple. No tracheal deviation present.  Cardiovascular: Normal rate and regular rhythm.   Pulmonary/Chest: Effort normal. No respiratory distress.  Abdominal: Soft. He exhibits no distension.  Neurological: He is alert and oriented to person, place, and time. No cranial nerve deficit. Coordination normal.  GCS eye subscore is 4. GCS verbal subscore is 5. GCS motor subscore is 6.  4/5 strength LUE, 5/5 strength throughout other extremities  Skin: Skin is warm and dry.  Psychiatric: He has a normal mood and affect.    ED Course  Procedures (including critical care time) Labs Review Labs Reviewed  CBC - Abnormal; Notable for the following:    Hemoglobin 12.7 (*)    HCT 38.1 (*)    All other  components within normal limits  COMPREHENSIVE METABOLIC PANEL - Abnormal; Notable for the following:    Chloride 100 (*)    Glucose, Bld 105 (*)    BUN 24 (*)    Creatinine, Ser 1.98 (*)    ALT 14 (*)    Alkaline Phosphatase 23 (*)    GFR calc non Af Amer 38 (*)    GFR calc Af Amer 44 (*)    All other components within normal limits  PROTIME-INR  APTT  DIFFERENTIAL  URINALYSIS, ROUTINE W REFLEX MICROSCOPIC (NOT AT [facility])  CBG MONITORING, ED    Imaging Review Ct Head Wo Contrast  10/07/2014   CLINICAL DATA:  Left frontal region headache with left arm weakness  EXAM: CT HEAD WITHOUT CONTRAST  TECHNIQUE: Contiguous axial images were obtained from the base of the skull through the vertex without intravenous contrast.  COMPARISON:  Brain MRI January 25, 2014; head CT April 16, 2014  FINDINGS: The ventricles are normal in size and configuration. There is no intracranial mass, hemorrhage, extra-axial fluid collection, or midline shift. No gray-white compartment lesions are appreciable on CT. No acute infarct evident. Bony calvarium appears intact. Mastoid air cells are clear.  IMPRESSION: No intracranial mass, hemorrhage, or evidence of acute infarct. No focal gray -white compartment lesions are identified by CT.   Electronically Signed   By: Bretta Bang III M.D.   On: 10/07/2014 14:38   I have personally reviewed and evaluated these images and lab results as part of my medical decision-making.   EKG Interpretation   Date/Time:  Monday October 07 2014 14:42:10 EDT Ventricular Rate:  82 PR Interval:  176 QRS Duration: 98 QT Interval:  370 QTC Calculation: 432 R Axis:   -27 Text Interpretation:  Normal sinus rhythm Possible Left atrial enlargement  Left ventricular hypertrophy Abnormal ECG Confirmed by Ashlee Bewley MD, Reuel Boom  ([id number]) on 10/07/2014 2:44:17 PM      MDM   Final diagnoses:  Migraine variant without intractability    49 year old male presents with increasing left  sided tingling, numbness, weakness that he has had previously with history of stroke. He has been seen here previously for similar symptoms and noted to have sequela of migraine and negative MRI at that time. No clear last known normal so code stroke was not initiated on arrival. After examination I spoke with Dr. Amada Jupiter of neurology over the phone who reviewed the patient's chart and after discussing symptoms recommended empiric treatment for migraine which usually works to resolve his symptoms. If symptoms do not resolve plan will be to transfer to [facility] for MRI as definitive diagnostic modality.  Patient was beginning to improve, care transferred to Dr. Corlis Leak at 4 PM pending reassessment and likely discharge if symptoms resolve.    Lyndal Pulley, MD 10/07/14 [phone]

## 2015-01-15 ENCOUNTER — Telehealth: Payer: Self-pay | Admitting: Neurology

## 2015-01-15 NOTE — Telephone Encounter (Signed)
Pt called and says he needs a DOT letter stating that he is ok to drive. He was never seen here. Pt had appts but did not show up. He stated that he was seen in research by Dr. Pearlean BrownieSethi. Please call and advise [phone]

## 2015-01-15 NOTE — Telephone Encounter (Signed)
A person with DOT rep called asking if pt had been cleared to drive. He said pt was sitting in front of him. I relayed information of previous letters, and earlier telephone msg. Representative said he would have pt get in touch with office. This call came in about 3:30

## 2015-01-15 NOTE — Telephone Encounter (Signed)
Patient called back, wants to know if someone will call him back today about the letter for DOT. Says he has waited patiently all day. Says he was told he didn't show up for appointments and states "that's a lie".

## 2015-01-15 NOTE — Telephone Encounter (Signed)
Rn call patient back about needing a letter stating he can drive. Rn explain all other letters in the sytem states he is not allowed to drive. Pt stated he was seen in research by DR.Sethi. Pt stated the [facility] and he cannot find the letter. Pt stated he does not have a copy of the letter. Pt has been driving for a year.Pt stated he was release from Dr.Sethi. Rn will discuss this with Dr.Sethi.

## 2015-01-21 NOTE — Telephone Encounter (Signed)
If patient calls back, Rn spoke with Rizwan in research,and [facility] about [facility] stating a letter was written to clear him to drive. Rizwan went into the files of Research and found two letters stating patient was not cleared to drive. Also the letters in the epic system states he has not been cleared till drive. Dr.Sethi will not write a letter clearing patient because he was seen in research, but neve follow up with Dr.Sethi for office visit.

## 2015-09-26 DIAGNOSIS — I6930 Unspecified sequelae of cerebral infarction: Secondary | ICD-10-CM | POA: Insufficient documentation

## 2016-07-12 ENCOUNTER — Encounter (INDEPENDENT_AMBULATORY_CARE_PROVIDER_SITE_OTHER): Payer: Self-pay

## 2016-07-12 ENCOUNTER — Ambulatory Visit (INDEPENDENT_AMBULATORY_CARE_PROVIDER_SITE_OTHER): Payer: BLUE CROSS/BLUE SHIELD | Admitting: Neurology

## 2016-07-12 ENCOUNTER — Encounter: Payer: Self-pay | Admitting: Neurology

## 2016-07-12 VITALS — BP 139/91 | HR 73 | Ht 71.0 in | Wt 196.2 lb

## 2016-07-12 DIAGNOSIS — I6990 Unspecified sequelae of unspecified cerebrovascular disease: Secondary | ICD-10-CM | POA: Diagnosis not present

## 2016-07-12 NOTE — Progress Notes (Signed)
[facility] [address] [place]. [place] [address] [phone]       OFFICE FOLLOW-UP NOTE  Kevin. Kevin Jefferson Date of Birth:  1965-12-27 Medical Record Number:  [id number]   HPI: Kevin Jefferson is a 51 year male seen today for first office follow-up visit following hospital admission for TIA in March 2015. He presented with sudden onset of headache, dizziness and photophobia as well as lethargy and MRI scan showed a small right medial temporal infarct which I have personally reviewed the imaging films which was felt to be of cryptogenic etiology as workup including transesophageal echocardiogram, vascular studies and hypercoagulable panel were all unremarkable. He was enrolled in the Socrates research trial( aspirin plus Brilinta versus aspirin plus placebo) and computed participation but did not keep any schedule office follow-up visit after that. He states that patient was unable to work. He was under significant stress at that time since his son had passed away. He states is doing better now. He is past the department of transportation physical and is here to see me today to get a neurological clearance for driving. Patient states he is feeling a lot better now his blood pressure is much controlled. He is decreased salt intake. His blood pressure today is 139/91. He has stopped taking aspirin. Is tolerating Zocor well without muscle aches or pains and states his last cholesterol was quite satisfactory. He has no new complaints today.  ROS:   14 system review of systems is positive for  no complaints today PMH:  Past Medical History:  Diagnosis Date  . Hypertension   . Stroke [facility])     Social History:  Social History   Social History  . Marital status: Married    Spouse name: N/A  . Number of children: 3  . Years of education: N/A   Occupational History  . 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 Smoking status: Never Smoker  .  Smokeless tobacco: Never Used  . Alcohol use No  . Drug use: No  . Sexual activity: Not on file   Other Topics Concern  . Not on file   Social History Narrative   Lives at home w/ his wife   Right-handed   Caffeine: 2 cups of coffee and 1 glass of tea per day    Medications:   Current Outpatient Prescriptions on File Prior to Visit  Medication Sig Dispense Refill  . amLODipine (NORVASC) 10 MG tablet Take 10 mg by mouth daily.    Marland Kitchen lisinopril-hydrochlorothiazide (PRINZIDE,ZESTORETIC) 20-12.5 MG per tablet     . simvastatin (ZOCOR) 20 MG tablet Take 1 tablet (20 mg total) by mouth every evening. 30 tablet 4  . research study medication Take 90 mg by mouth 2 (two) times daily. (Patient not taking: Reported on 07/12/2016)    . research study medication Take 100 mg by mouth daily. (Patient not taking: Reported on 07/12/2016)    . triamterene-hydrochlorothiazide (DYAZIDE) 37.5-25 MG per capsule Take 1 capsule by mouth daily.      No current facility-administered medications on file prior to visit.     Allergies:   Allergies  Allergen Reactions  . Hydralazine Hcl Shortness Of Breath    SOB ?     Physical Exam General: well developed, well nourished Middle-age male, seated, in no evident distress Head: head normocephalic and atraumatic.  Neck: supple with no carotid or supraclavicular bruits Cardiovascular: regular rate and rhythm, no murmurs  Musculoskeletal: no deformity Skin:  no rash/petichiae Vascular:  Normal pulses all extremities Vitals:   07/12/16 0903  BP: (!) 139/91  Pulse: 73   Neurologic Exam Mental Status: Awake and fully alert. Oriented to place and time. Recent and remote memory intact. Attention span, concentration and fund of knowledge appropriate. Mood and affect appropriate.  Cranial Nerves: Fundoscopic exam reveals sharp disc margins. Pupils equal, briskly reactive to light. Extraocular movements full without nystagmus. Visual fields full to confrontation.  Hearing intact. Facial sensation intact. Face, tongue, palate moves normally and symmetrically.  Motor: Normal bulk and tone. Normal strength in all tested extremity muscles. Sensory.: intact to touch ,pinprick .position and vibratory sensation.  Coordination: Rapid alternating movements normal in all extremities. Finger-to-nose and heel-to-shin performed accurately bilaterally. Gait and Station: Arises from chair without difficulty. Stance is normal. Gait demonstrates normal stride length and balance . Able to heel, toe and tandem walk without difficulty.  Reflexes: 1+ and symmetric. Toes downgoing.   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 He participated in the Socrates research trial. Patient has done well without recurrent stroke symptoms. Vascular risk factors of hypertension, hyperlipidemia and cerebrovascular disease    PLAN: I had a long d/w patient about his remote stroke, risk for recurrent stroke/TIAs, personally independently reviewed imaging studies and stroke evaluation results and answered questions.Start aspirin 81 mg daily  for secondary stroke prevention and maintain strict control of hypertension with blood pressure goal below 130/90, diabetes with hemoglobin A1c goal below 6.5% and lipids with LDL cholesterol goal below 70 mg/dL. I also advised the patient to eat a healthy diet with plenty of whole grains, cereals, fruits and vegetables, exercise regularly and maintain ideal body weight. The patient is requesting a letter to the Department of Transportation for clearance for driving. Marland Kitchen. He is also requesting a letter for his wife for an excuse for her college courses as she was taking care of of him when he had a stroke 3 years ago No routine scheduled follow-up appointment is necessary in the future. Greater than 50% of time during this 25 minute visit was spent on  counseling,explanation of diagnosis, planning of further management, discussion with patient and family and coordination of care Kevin HeadyPramod Josealfredo Adkins, MD  [facility] [address] [place], [place] [id number]  Phone [phone] Fax [phone] Note: This document was prepared with digital dictation and possible smart phrase technology. Any transcriptional errors that result from this process are unintentional

## 2016-07-12 NOTE — Patient Instructions (Signed)
I had a long d/w patient about his remote stroke, risk for recurrent stroke/TIAs, personally independently reviewed imaging studies and stroke evaluation results and answered questions.Start aspirin 81 mg daily  for secondary stroke prevention and maintain strict control of hypertension with blood pressure goal below 130/90, diabetes with hemoglobin A1c goal below 6.5% and lipids with LDL cholesterol goal below 70 mg/dL. I also advised the patient to eat a healthy diet with plenty of whole grains, cereals, fruits and vegetables, exercise regularly and maintain ideal body weight. The patient is requesting a letter to the Department of Transportation for clearance for driving. . No routine scheduled follow-up appointment is necessary in the future.

## 2016-07-26 ENCOUNTER — Telehealth: Payer: Self-pay

## 2016-07-26 NOTE — Telephone Encounter (Signed)
Patient brought paper from [facility]. 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 Pt filled out release form to get copy of office notes. Paper given office note, and dot form. Rn send copy to medical records.

## 2016-09-17 ENCOUNTER — Telehealth: Payer: Self-pay | Admitting: Neurology

## 2016-09-19 ENCOUNTER — Encounter (HOSPITAL_BASED_OUTPATIENT_CLINIC_OR_DEPARTMENT_OTHER): Payer: Self-pay | Admitting: Emergency Medicine

## 2016-09-19 ENCOUNTER — Emergency Department (HOSPITAL_BASED_OUTPATIENT_CLINIC_OR_DEPARTMENT_OTHER)
Admission: EM | Admit: 2016-09-19 | Discharge: 2016-09-19 | Disposition: A | Discharge status: Home / Self Care | Payer: BLUE CROSS/BLUE SHIELD | Attending: Emergency Medicine | Admitting: Emergency Medicine

## 2016-09-19 DIAGNOSIS — N183 Chronic kidney disease, stage 3 (moderate): Secondary | ICD-10-CM | POA: Insufficient documentation

## 2016-09-19 DIAGNOSIS — Z79899 Other long term (current) drug therapy: Secondary | ICD-10-CM | POA: Insufficient documentation

## 2016-09-19 DIAGNOSIS — M79672 Pain in left foot: Secondary | ICD-10-CM | POA: Diagnosis present

## 2016-09-19 DIAGNOSIS — I129 Hypertensive chronic kidney disease with stage 1 through stage 4 chronic kidney disease, or unspecified chronic kidney disease: Secondary | ICD-10-CM | POA: Insufficient documentation

## 2016-09-19 DIAGNOSIS — M109 Gout, unspecified: Secondary | ICD-10-CM | POA: Diagnosis not present

## 2016-09-19 MED ORDER — HYDROCODONE-ACETAMINOPHEN 5-325 MG PO TABS: ORAL_TABLET | ORAL | 0 refills | Status: DC

## 2016-09-19 MED ORDER — NAPROXEN 500 MG PO TABS: 500.0000 mg | ORAL_TABLET | Freq: Two times a day (BID) | ORAL | 0 refills | Status: DC

## 2016-09-19 MED ORDER — PREDNISONE 20 MG PO TABS: ORAL_TABLET | ORAL | 0 refills | Status: DC

## 2016-09-19 NOTE — Discharge Instructions (Signed)
Please read and follow all provided instructions.  Your diagnoses today include:  1. Acute gout involving toe of left foot, unspecified cause     Tests performed today include:  Vital signs. See below for your results today.   Medications prescribed:   Prednisone - steroid medicine   It is best to take this medication in the morning to prevent sleeping problems. If you are diabetic, monitor your blood sugar closely and stop taking Prednisone if blood sugar is over 300. Take with food to prevent stomach upset.    Naproxen - anti-inflammatory pain medication  Do not exceed  naproxen every 12 hours, take with food  You have been prescribed an anti-inflammatory medication or NSAID. Take with food. Take smallest effective dose for the shortest duration needed for your pain. Stop taking if you experience stomach pain or vomiting.    Vicodin (hydrocodone/acetaminophen) - narcotic pain medication  DO NOT drive or perform any activities that require you to be awake and alert because this medicine can make you drowsy. BE VERY CAREFUL not to take multiple medicines containing Tylenol (also called acetaminophen). Doing so can lead to an overdose which can damage your liver and cause liver failure and possibly death.  Take any prescribed medications only as directed.  Home care instructions:   Follow any educational materials contained in this packet  Follow R.I.C.E. Protocol:  R - rest your injury   I  - use ice on injury without applying directly to skin  C - compress injury with bandage or splint  E - elevate the injury as much as possible  Follow-up instructions: Please follow-up with your primary care provider if you continue to have significant pain in 1 week.   Return instructions:   Please return if your toes or feet are numb or tingling, appear gray or blue, or you have severe pain (also elevate the leg and loosen splint or wrap if you were given one)  Please  return to the Emergency Department if you experience worsening symptoms.   Please return if you have any other emergent concerns.  Additional Information:  Your vital signs today were: BP (!) 168/115 (BP Location: Right Arm)    Pulse 85    Temp 98.6 F (37 C) (Oral)    Resp 18    Ht  (1.803 m)    Wt 90.7 kg (200 lb)    SpO2 100%    BMI 27.89 kg/m  If your blood pressure (BP) was elevated above 135/85 this visit, please have this repeated by your doctor within one month. --------------

## 2016-09-19 NOTE — ED Triage Notes (Signed)
Patient woke up about 2 -3 days ago with pain and swelling to his left foot. Patient denies any injury

## 2016-09-19 NOTE — ED Provider Notes (Signed)
[facility]   CSN: [id number] Arrival date & time: 09/19/16  1131     History   Chief Complaint Chief Complaint  Patient presents with  . Foot Pain    HPI Kevin Jefferson is a 51 y.o. male.  Patient with history of high blood pressure, stroke -- presents with acute onset of left toe pain starting 2 days ago. Pain was mild and progressively became worse. It was not associated with injury. Pain is centered in the MTP joint of the first toe. Patient has been doing warm soaks and has noticed some mild swelling. He has never had a history of gout. No previous arthritis. Does report eating cream a few vegetables. No fevers, chills, nausea or vomiting. Patient is on HCTZ for blood pressure. The onset of this condition was acute. The course is constant. Aggravating factors: Movement and walking. Alleviating factors: none.        Past Medical History:  Diagnosis Date  . Hypertension   . Stroke [facility])     Patient Active Problem List   Diagnosis Date Noted  . CVA (cerebral infarction) 04/03/2013  . Chest pain 04/01/2013  . Encephalopathy 04/01/2013  . Hypertensive urgency 04/01/2013  . Right sided weakness 04/01/2013  . CKD (chronic kidney disease) stage 3, GFR 30-59 ml/min 04/01/2013  . NWGNFAOZ(308.6) 04/01/2013    Past Surgical History:  Procedure Laterality Date  . TEE WITHOUT CARDIOVERSION N/A 04/03/2013   Procedure: TRANSESOPHAGEAL ECHOCARDIOGRAM (TEE);  Surgeon: Lewayne Bunting, MD;  Location: [facility] ENDOSCOPY;  Service: Cardiovascular;  Laterality: N/A;       Home Medications    Prior to Admission medications   Medication Sig Start Date End Date Taking? Authorizing Provider  amLODipine (NORVASC) 10 MG tablet Take 10 mg by mouth daily.    [provider]  HYDROcodone-acetaminophen (NORCO/VICODIN) 5-325 MG tablet Take 1-2 tablets every 6 hours as needed for severe pain 09/19/16   Renne Crigler, PA-C  lisinopril-hydrochlorothiazide  (PRINZIDE,ZESTORETIC) 20-12.5 MG per tablet  12/10/13   [provider]  naproxen (NAPROSYN) 500 MG tablet Take 1 tablet (500 mg total) by mouth 2 (two) times daily. 09/19/16   Renne Crigler, PA-C  predniSONE (DELTASONE) 20 MG tablet 3 Tabs PO Days 1-3, then 2 tabs PO Days 4-6, then 1 tab PO Day 7-9, then Half Tab PO Day 10-12 09/19/16   Renne Crigler, PA-C  simvastatin (ZOCOR) 20 MG tablet Take 1 tablet (20 mg total) by mouth every evening. 04/04/13   Rai, Ripudeep K, MD  triamterene-hydrochlorothiazide (DYAZIDE) 37.5-25 MG per capsule Take 1 capsule by mouth daily.     [provider]    Family History Family History  Problem Relation Age of Onset  . Hypertension Mother   . Irregular heart beat Maternal Grandmother   . Hypertension Maternal Grandmother     Social History Social History  Substance Use Topics  . Smoking status: Never Smoker  . Smokeless tobacco: Never Used  . Alcohol use No     Allergies   Hydralazine hcl   Review of Systems Review of Systems  Constitutional: Negative for activity change.  Musculoskeletal: Positive for arthralgias and joint swelling. Negative for back pain, gait problem and neck pain.  Skin: Negative for wound.  Neurological: Negative for weakness and numbness.     Physical Exam Updated Vital Signs BP (!) 168/115 (BP Location: Right Arm)   Pulse 85   Temp 98.6 F (37 C) (Oral)   Resp 18   Ht   (1.803 m)   Wt 90.7 kg (200 lb)   SpO2 100%   BMI 27.89 kg/m   Physical Exam  Constitutional: He appears well-developed and well-nourished.  HENT:  Head: Normocephalic and atraumatic.  Eyes: Conjunctivae are normal.  Neck: Normal range of motion. Neck supple.  Cardiovascular: Normal pulses.  Exam reveals no decreased pulses.   Musculoskeletal: He exhibits tenderness. He exhibits no edema.       Left hip: Normal.       Left knee: Normal.       Left ankle: Normal. No tenderness. No lateral malleolus and no medial  malleolus tenderness found.       Left foot: There is tenderness and bony tenderness. There is normal range of motion and no swelling.       Feet:  Neurological: He is alert. No sensory deficit.  Motor, sensation, and vascular distal to the injury is fully intact.   Skin: Skin is warm and dry.  Psychiatric: He has a normal mood and affect.  Nursing note and vitals reviewed.    ED Treatments / Results  Labs (all labs ordered are listed, but only abnormal results are displayed) Labs Reviewed - No data to display  EKG  EKG Interpretation None       Radiology No results found.  Procedures Procedures (including critical care time)  Medications Ordered in ED Medications - No data to display   Initial Impression / Assessment and Plan / ED Course  I have reviewed the triage vital signs and the nursing notes.  Pertinent labs & imaging results that were available during my care of the patient were reviewed by me and considered in my medical decision making (see chart for details).     Patient seen and examined.    Vital signs reviewed and are as follows: BP (!) 168/115 (BP Location: Right Arm)   Pulse 85   Temp 98.6 F (37 C) (Oral)   Resp 18   Ht  (1.803 m)   Wt 90.7 kg (200 lb)   SpO2 100%   BMI 27.89 kg/m   Suspect gout flare given clinical history and exam.  Patient counseled on low purine diet. Will treat with prednisone. Mildly elevated creatinine in the past. Will treat with short course of NSAIDs, patient counseled to use at lowest dose for least amount of time required. Home with Vicodin for pain until anti-inflammatories have a chance to work. Will ask his PCP about HCTZ use in setting of gout.   Patient counseled on use of narcotic pain medications. Counseled not to combine these medications with others containing tylenol. Urged not to drink alcohol, drive, or perform any other activities that requires focus while taking these medications. The patient  verbalizes understanding and agrees with the plan.   Final Clinical Impressions(s) / ED Diagnoses   Final diagnoses:  Acute gout involving toe of left foot, unspecified cause   Patient with onset of pain at the first MTP of the left foot. Exam and history consistent with gout. No signs of septic arthritis. Will treat as above.    New Prescriptions New Prescriptions   HYDROCODONE-ACETAMINOPHEN (NORCO/VICODIN) 5-325 MG TABLET    Take 1-2 tablets every 6 hours as needed for severe pain   NAPROXEN (NAPROSYN) 500 MG TABLET    Take 1 tablet (500 mg total) by mouth 2 (two) times daily.   PREDNISONE (DELTASONE) 20 MG TABLET    3 Tabs PO Days 1-3, then 2 tabs PO  Days 4-6, then 1 tab PO Day 7-9, then Half Tab PO Day 10-12     Renne CriglerGeiple, Beatrix Breece, [place]PA-C 09/19/16 1208    Lavera GuiseLiu, Dana Duo, MD 09/19/16 [phone]

## 2016-09-20 NOTE — Telephone Encounter (Signed)
Pt called back to inform RN Katrina that 1 it was for school not for job and secondly  Pt was able to start back driving DOT

## 2016-09-20 NOTE — Telephone Encounter (Addendum)
Rn call patient about the letter for his wife school. Rn stated per Kevin Jefferson in research he was seen from 04/01/2013 to 08/03/2013. Rn stated the letter can be written that she was the caregiver, and took care of him while he was recovering. PT was also unable to drive during that time period. Pt verbalized understanding that the letter would reflect 03/2013 to 07/2013. Rn stated the letter will be at the front desk tomorrow. Pt understands.The letter should be [facility] [employer] and its for his wife Kevin Jefferson .

## 2016-09-20 NOTE — Telephone Encounter (Signed)
Rn waiting on dates from 2015 when pt was unable to drive from the research study.

## 2016-09-20 NOTE — Telephone Encounter (Addendum)
IF patient calls back give him the following message;  Dr. Pearlean BrownieSethi reviewed pts chart. Pt was in research study years ago. He saw Dr. Pearlean BrownieSethi on 07/12/2016 for office visit from a stroke in 03/2013. 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 Per Dr. 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 DR. Pearlean BrownieSethi had not seen the pt since 2015 when he was in research.Rn will speak with research staff to get dates of when pt was in the study and was unable to drive.

## 2016-09-20 NOTE — Telephone Encounter (Signed)
Revised. 

## 2016-09-20 NOTE — Telephone Encounter (Signed)
Rn call patient and read the letter to him. Pt stated the letter sound sufficient, and he will pick it up tomorrow. Rn stated the letter will be at the front desk with him name on it. Rn explain Dr. Pearlean BrownieSEthi will not be in the office next week, because he rotates to the hospital. PT verbalized understanding.

## 2017-06-14 DIAGNOSIS — S3982XA Other specified injuries of lower back, initial encounter: Secondary | ICD-10-CM | POA: Diagnosis present

## 2017-06-14 DIAGNOSIS — Z79899 Other long term (current) drug therapy: Secondary | ICD-10-CM | POA: Insufficient documentation

## 2017-06-14 DIAGNOSIS — N183 Chronic kidney disease, stage 3 (moderate): Secondary | ICD-10-CM | POA: Diagnosis not present

## 2017-06-14 DIAGNOSIS — Y9389 Activity, other specified: Secondary | ICD-10-CM | POA: Diagnosis not present

## 2017-06-14 DIAGNOSIS — Z8673 Personal history of transient ischemic attack (TIA), and cerebral infarction without residual deficits: Secondary | ICD-10-CM | POA: Insufficient documentation

## 2017-06-14 DIAGNOSIS — S39012A Strain of muscle, fascia and tendon of lower back, initial encounter: Secondary | ICD-10-CM | POA: Diagnosis not present

## 2017-06-14 DIAGNOSIS — I129 Hypertensive chronic kidney disease with stage 1 through stage 4 chronic kidney disease, or unspecified chronic kidney disease: Secondary | ICD-10-CM | POA: Insufficient documentation

## 2017-06-14 DIAGNOSIS — X500XXA Overexertion from strenuous movement or load, initial encounter: Secondary | ICD-10-CM | POA: Insufficient documentation

## 2017-06-14 DIAGNOSIS — Y999 Unspecified external cause status: Secondary | ICD-10-CM | POA: Insufficient documentation

## 2017-06-14 DIAGNOSIS — Y929 Unspecified place or not applicable: Secondary | ICD-10-CM | POA: Diagnosis not present

## 2017-06-15 ENCOUNTER — Other Ambulatory Visit: Payer: Self-pay

## 2017-06-15 ENCOUNTER — Emergency Department (HOSPITAL_COMMUNITY)
Admission: EM | Admit: 2017-06-15 | Discharge: 2017-06-15 | Disposition: A | Discharge status: Home / Self Care | Payer: BLUE CROSS/BLUE SHIELD | Attending: Emergency Medicine | Admitting: Emergency Medicine

## 2017-06-15 ENCOUNTER — Encounter (HOSPITAL_COMMUNITY): Payer: Self-pay | Admitting: Emergency Medicine

## 2017-06-15 DIAGNOSIS — S39012A Strain of muscle, fascia and tendon of lower back, initial encounter: Secondary | ICD-10-CM

## 2017-06-15 MED ORDER — CYCLOBENZAPRINE HCL 10 MG PO TABS: 5.0000 mg | ORAL_TABLET | Freq: Every day | ORAL | 0 refills | Status: AC

## 2017-06-15 MED ORDER — ACETAMINOPHEN 500 MG PO TABS
1000.0000 mg | ORAL_TABLET | Freq: Once | ORAL | Status: AC
  Administered 2017-06-15: 1000 mg via ORAL
  Filled 2017-06-15: qty 2

## 2017-06-15 MED ORDER — CYCLOBENZAPRINE HCL 10 MG PO TABS
5.0000 mg | ORAL_TABLET | Freq: Once | ORAL | Status: AC
  Administered 2017-06-15: 5 mg via ORAL
  Filled 2017-06-15: qty 1

## 2017-06-15 NOTE — ED Triage Notes (Signed)
Pt reports low back pain onset this afternoon  After lifting aq battery out of a boat. initally felt no pain but had slow progress to the point he now can not stand w/o great pain

## 2017-06-15 NOTE — ED Provider Notes (Signed)
[facility]  CSN: [id number] Arrival date & time: 06/14/17 2332  Chief Complaint(s) Back Pain  HPI Kevin Jefferson is a 52 y.o. male   Patient reports that he has been sleeping in a recliner for the past several days and noted a mild lower back discomfort, which was exacerbated after lifting a boat battery.   Back Pain   This is a new problem. The current episode started 12 to 24 hours ago. The problem occurs constantly. The problem has not changed since onset.The pain is associated with lifting heavy objects. The pain is present in the sacro-iliac joint. The quality of the pain is described as shooting (throbbing). Radiates to: bilateral buttocks only with movement. The pain is severe. The symptoms are aggravated by certain positions, twisting and bending. Pertinent negatives include no chest pain, no bowel incontinence, no perianal numbness, no bladder incontinence, no pelvic pain, no leg pain, no paresthesias and no weakness. Treatments tried: tylenol. The treatment provided no relief.     Past Medical History Past Medical History:  Diagnosis Date  . Hypertension   . Stroke [facility])    Patient Active Problem List   Diagnosis Date Noted  . CVA (cerebral infarction) 04/03/2013  . Chest pain 04/01/2013  . Encephalopathy 04/01/2013  . Hypertensive urgency 04/01/2013  . Right sided weakness 04/01/2013  . CKD (chronic kidney disease) stage 3, GFR 30-59 ml/min (HCC) 04/01/2013  . Headache(784.0) 04/01/2013   Home Medication(s) Prior to Admission medications   Medication Sig Start Date End Date Taking? Authorizing Provider  amLODipine (NORVASC) 10 MG tablet Take 10 mg by mouth daily.    [provider]  cyclobenzaprine (FLEXERIL) 10 MG tablet Take 0.5-1 tablets (5-10 mg total) by mouth at bedtime for 10 days. 06/15/17 06/25/17  Nira Conn, MD  HYDROcodone-acetaminophen (NORCO/VICODIN) 5-325 MG tablet Take 1-2 tablets every  6 hours as needed for severe pain 09/19/16   Renne Crigler, PA-C  lisinopril-hydrochlorothiazide (PRINZIDE,ZESTORETIC) 20-12.5 MG per tablet  12/10/13   [provider]  naproxen (NAPROSYN) 500 MG tablet Take 1 tablet (500 mg total) by mouth 2 (two) times daily. 09/19/16   Renne Crigler, PA-C  predniSONE (DELTASONE) 20 MG tablet 3 Tabs PO Days 1-3, then 2 tabs PO Days 4-6, then 1 tab PO Day 7-9, then Half Tab PO Day 10-12 09/19/16   Renne Crigler, PA-C  simvastatin (ZOCOR) 20 MG tablet Take 1 tablet (20 mg total) by mouth every evening. 04/04/13   Rai, Ripudeep K, MD  triamterene-hydrochlorothiazide (DYAZIDE) 37.5-25 MG per capsule Take 1 capsule by mouth daily.     [provider]                                                                                                                                    Past Surgical History Past Surgical History:  Procedure Laterality Date  . TEE WITHOUT CARDIOVERSION N/A 04/03/2013  Procedure: TRANSESOPHAGEAL ECHOCARDIOGRAM (TEE);  Surgeon: Lewayne BuntingBrian S Crenshaw, MD;  [facility];  Service: Cardiovascular;  Laterality: N/A;   Family History Family History  Problem Relation Age of Onset  . Hypertension Mother   . Irregular heart beat Maternal Grandmother   . Hypertension Maternal Grandmother     Social History Social History   Tobacco Use  . Smoking status: Never Smoker  . Smokeless tobacco: Never Used  Substance Use Topics  . Alcohol use: No  . Drug use: No   Allergies Hydralazine hcl  Review of Systems Review of Systems  Cardiovascular: Negative for chest pain.  Gastrointestinal: Negative for bowel incontinence.  Genitourinary: Negative for bladder incontinence and pelvic pain.  Musculoskeletal: Positive for back pain.  Neurological: Negative for weakness and paresthesias.   All other systems are reviewed and are negative for acute change except as noted in the HPI  Physical Exam Vital Signs  I have reviewed  the triage vital signs BP (!) 150/93 (BP Location: Left Arm)   Pulse 80   Temp 98.3 F (36.8 C) (Oral)   Resp 15   Ht 5\' 11"  (1.803 m)   Wt 93 kg (205 lb)   SpO2 96%   BMI 28.59 kg/m   Physical Exam  Constitutional: He is oriented to person, place, and time. He appears well-developed and well-nourished. No distress.  HENT:  Head: Normocephalic and atraumatic.  Right Ear: External ear normal.  Left Ear: External ear normal.  Nose: Nose normal.  Mouth/Throat: Mucous membranes are normal. No trismus in the jaw.  Eyes: Conjunctivae and EOM are normal. No scleral icterus.  Neck: Normal range of motion and phonation normal.  Cardiovascular: Normal rate and regular rhythm.  Pulmonary/Chest: Effort normal. No stridor. No respiratory distress.  Abdominal: He exhibits no distension.  Musculoskeletal: Normal range of motion. He exhibits no edema.       Lumbar back: He exhibits tenderness and spasm.       Back:  Neurological: He is alert and oriented to person, place, and time.  Spine Exam: Strength: 5/5 throughout LE bilaterally (hip flexion/extension, adduction/abduction; knee flexion/extension; foot dorsiflexion/plantarflexion, inversion/eversion; great toe inversion) Sensation: Intact to light touch in proximal and distal LE bilaterally Reflexes: 1+ quadriceps and achilles reflexes   Skin: He is not diaphoretic.  Psychiatric: He has a normal mood and affect. His behavior is normal.  Vitals reviewed.   ED Results and Treatments Labs (all labs ordered are listed, but only abnormal results are displayed) Labs Reviewed - No data to display                                                                                                                       EKG  EKG Interpretation  Date/Time:    Ventricular Rate:    PR Interval:    QRS Duration:   QT Interval:    QTC Calculation:   R Axis:     Text Interpretation:  Radiology No results found. Pertinent labs &  imaging results that were available during my care of the patient were reviewed by me and considered in my medical decision making (see chart for details).  Medications Ordered in ED Medications  cyclobenzaprine (FLEXERIL) tablet 5 mg (5 mg Oral Given 06/15/17 0210)  acetaminophen (TYLENOL) tablet 1,000 mg (1,000 mg Oral Given 06/15/17 0210)                                                                                                                                    Procedures Procedures  (including critical care time)  Medical Decision Making / ED Course I have reviewed the nursing notes for this encounter and the patient's prior records (if available in EHR or on provided paperwork).    52 y.o. male presents with back pain in lumbar area for 2 days without signs of radicular pain. No acute traumatic onset. No red flag symptoms of fever, weight loss, saddle anesthesia, weakness, fecal/urinary incontinence or urinary retention.   Suspect MSK etiology. No indication for imaging emergently. Patient was recommended to take short course of scheduled NSAIDs and engage in early mobility as definitive treatment. Return precautions discussed for worsening or new concerning symptoms.   The patient appears reasonably screened and/or stabilized for discharge and I doubt any other medical condition or other [facility] requiring further screening, evaluation, or treatment in the ED at this time prior to discharge.  The patient is safe for discharge with strict return precautions.   Final Clinical Impression(s) / ED Diagnoses Final diagnoses:  Strain of lumbar region, initial encounter    Disposition: Discharge  Condition: Good  I have discussed the results, Dx and Tx plan with the patient who expressed understanding and agree(s) with the plan. Discharge instructions discussed at great length. The patient was given strict return precautions who verbalized understanding of the instructions. No further  questions at time of discharge.    ED Discharge Orders        Ordered    cyclobenzaprine (FLEXERIL) 10 MG tablet  Daily at bedtime     06/15/17 0156       Follow Up: Farrel Conners, MD [address] [place] [address] [phone]  Schedule an appointment as soon as possible for a visit  2-4 weeks, If symptoms do not improve or  worsen     This chart was dictated using voice recognition software.  Despite best efforts to proofread,  errors can occur which can change the documentation meaning.   Nira Conn, MD 06/15/17 [phone]

## 2017-06-15 NOTE — Discharge Instructions (Signed)
You may use over-the-counter Motrin (Ibuprofen), Acetaminophen (Tylenol), topical muscle creams such as SalonPas, Icy Hot, Bengay, etc. Please stretch, apply heat, and have massage therapy for additional assistance. ° °

## 2018-01-12 DIAGNOSIS — E785 Hyperlipidemia, unspecified: Secondary | ICD-10-CM | POA: Insufficient documentation

## 2018-01-12 DIAGNOSIS — D3501 Benign neoplasm of right adrenal gland: Secondary | ICD-10-CM | POA: Insufficient documentation

## 2018-01-12 DIAGNOSIS — D519 Vitamin B12 deficiency anemia, unspecified: Secondary | ICD-10-CM | POA: Insufficient documentation

## 2018-01-12 DIAGNOSIS — Z2821 Immunization not carried out because of patient refusal: Secondary | ICD-10-CM | POA: Insufficient documentation

## 2018-01-12 DIAGNOSIS — I700 Atherosclerosis of aorta: Secondary | ICD-10-CM | POA: Insufficient documentation

## 2018-01-12 DIAGNOSIS — E559 Vitamin D deficiency, unspecified: Secondary | ICD-10-CM | POA: Insufficient documentation

## 2018-03-19 ENCOUNTER — Other Ambulatory Visit: Payer: Self-pay

## 2018-03-19 ENCOUNTER — Emergency Department (HOSPITAL_BASED_OUTPATIENT_CLINIC_OR_DEPARTMENT_OTHER)
Admission: EM | Admit: 2018-03-19 | Discharge: 2018-03-19 | Disposition: A | Discharge status: Home / Self Care | Payer: BLUE CROSS/BLUE SHIELD | Attending: Emergency Medicine | Admitting: Emergency Medicine

## 2018-03-19 ENCOUNTER — Encounter (HOSPITAL_BASED_OUTPATIENT_CLINIC_OR_DEPARTMENT_OTHER): Payer: Self-pay | Admitting: *Deleted

## 2018-03-19 DIAGNOSIS — Z79899 Other long term (current) drug therapy: Secondary | ICD-10-CM | POA: Insufficient documentation

## 2018-03-19 DIAGNOSIS — M79671 Pain in right foot: Secondary | ICD-10-CM | POA: Diagnosis present

## 2018-03-19 DIAGNOSIS — I129 Hypertensive chronic kidney disease with stage 1 through stage 4 chronic kidney disease, or unspecified chronic kidney disease: Secondary | ICD-10-CM | POA: Insufficient documentation

## 2018-03-19 DIAGNOSIS — N183 Chronic kidney disease, stage 3 (moderate): Secondary | ICD-10-CM | POA: Insufficient documentation

## 2018-03-19 DIAGNOSIS — M109 Gout, unspecified: Secondary | ICD-10-CM | POA: Diagnosis not present

## 2018-03-19 HISTORY — DX: Gout, unspecified: M10.9

## 2018-03-19 MED ORDER — PREDNISONE 50 MG PO TABS: 50.0000 mg | ORAL_TABLET | Freq: Every day | ORAL | 0 refills | Status: DC

## 2018-03-19 MED ORDER — HYDROCODONE-ACETAMINOPHEN 5-325 MG PO TABS: 1.0000 | ORAL_TABLET | Freq: Four times a day (QID) | ORAL | 0 refills | Status: DC | PRN

## 2018-03-19 MED ORDER — HYDROCODONE-ACETAMINOPHEN 5-325 MG PO TABS
1.0000 | ORAL_TABLET | Freq: Once | ORAL | Status: AC
  Administered 2018-03-19: 1 via ORAL
  Filled 2018-03-19: qty 1

## 2018-03-19 NOTE — ED Triage Notes (Signed)
Pt reports right foot pain since Thursday. Hx of gout in the past

## 2018-03-19 NOTE — Discharge Instructions (Addendum)
Return here as needed.  Ice and elevate the foot.  Return here as needed.  Follow-up with your primary doctor.

## 2018-03-19 NOTE — ED Provider Notes (Signed)
[facility] EMERGENCY DEPARTMENT Provider Note   CSN: [id number] Arrival date & time: 03/19/18  2006    History   Chief Complaint Chief Complaint  Patient presents with  . Foot Pain    HPI Kevin Jefferson is a 53 y.o. male.     HPI Patient presents to the emergency department with right foot pain that started Thursday.  The patient states he has had gout in the same area.  Patient states nothing seems to make the condition better but certain movements palpation make the pain worse.  Patient states he did not take any medications prior to arrival but did use ice on his foot. Past Medical History:  Diagnosis Date  . Gout   . Hypertension   . Stroke [facility])     Patient Active Problem List   Diagnosis Date Noted  . CVA (cerebral infarction) 04/03/2013  . Chest pain 04/01/2013  . Encephalopathy 04/01/2013  . Hypertensive urgency 04/01/2013  . Right sided weakness 04/01/2013  . CKD (chronic kidney disease) stage 3, GFR 30-59 ml/min (HCC) 04/01/2013  . YMEBRAXE(940.7) 04/01/2013    Past Surgical History:  Procedure Laterality Date  . ANKLE FRACTURE SURGERY    . TEE WITHOUT CARDIOVERSION N/A 04/03/2013   Procedure: TRANSESOPHAGEAL ECHOCARDIOGRAM (TEE);  Surgeon: Lewayne Bunting, MD;  Location: [facility] ENDOSCOPY;  Service: Cardiovascular;  Laterality: N/A;        Home Medications    Prior to Admission medications   Medication Sig Start Date End Date Taking? Authorizing Provider  amLODipine (NORVASC) 10 MG tablet Take 10 mg by mouth daily.    [provider]  HYDROcodone-acetaminophen (NORCO/VICODIN) 5-325 MG tablet Take 1-2 tablets every 6 hours as needed for severe pain 09/19/16   Renne Crigler, PA-C  lisinopril-hydrochlorothiazide (PRINZIDE,ZESTORETIC) 20-12.5 MG per tablet  12/10/13   [provider]  naproxen (NAPROSYN) 500 MG tablet Take 1 tablet (500 mg total) by mouth 2 (two) times daily. 09/19/16   Renne Crigler, PA-C  predniSONE (DELTASONE) 20  MG tablet 3 Tabs PO Days 1-3, then 2 tabs PO Days 4-6, then 1 tab PO Day 7-9, then Half Tab PO Day 10-12 09/19/16   Renne Crigler, PA-C  simvastatin (ZOCOR) 20 MG tablet Take 1 tablet (20 mg total) by mouth every evening. 04/04/13   Rai, Ripudeep K, MD  triamterene-hydrochlorothiazide (DYAZIDE) 37.5-25 MG per capsule Take 1 capsule by mouth daily.     [provider]    Family History Family History  Problem Relation Age of Onset  . Hypertension Mother   . Irregular heart beat Maternal Grandmother   . Hypertension Maternal Grandmother     Social History Social History   Tobacco Use  . Smoking status: Never Smoker  . Smokeless tobacco: Never Used  Substance Use Topics  . Alcohol use: No  . Drug use: No     Allergies   Hydralazine hcl   Review of Systems Review of Systems All other systems negative except as documented in the HPI. All pertinent positives and negatives as reviewed in the HPI.  Physical Exam Updated Vital Signs BP 129/89 (BP Location: Right Arm)   Pulse 76   Temp 98.2 F (36.8 C) (Oral)   Resp 18   Ht 5\' 11"  (1.803 m)   Wt 89.8 kg   SpO2 99%   BMI 27.62 kg/m   Physical Exam Vitals signs and nursing note reviewed.  Constitutional:      General: He is not in acute distress.  Appearance: He is well-developed.  HENT:     Head: Normocephalic and atraumatic.  Eyes:     Pupils: Pupils are equal, round, and reactive to light.  Pulmonary:     Effort: Pulmonary effort is normal.  Musculoskeletal:     Right foot: Tenderness and swelling present.       Feet:  Skin:    General: Skin is warm and dry.  Neurological:     Mental Status: He is alert and oriented to person, place, and time.      ED Treatments / Results  Labs (all labs ordered are listed, but only abnormal results are displayed) Labs Reviewed - No data to display  EKG None  Radiology No results found.  Procedures Procedures (including critical care  time)  Medications Ordered in ED Medications - No data to display   Initial Impression / Assessment and Plan / ED Course  I have reviewed the triage vital signs and the nursing notes.  Pertinent labs & imaging results that were available during my care of the patient were reviewed by me and considered in my medical decision making (see chart for details).        Patient be treated for gout based on his HPI and physical exam findings.  Have advised the patient of the plan and all questions were answered.  Patient is advised to ice and elevate the foot and toe.  Final Clinical Impressions(s) / ED Diagnoses   Final diagnoses:  None    ED Discharge Orders    None       Kyra Manges 03/19/18 2101    Tilden Fossa, MD 03/24/18 1207

## 2018-03-19 NOTE — ED Notes (Signed)
Escorted pt to lobby to await ride

## 2018-03-24 ENCOUNTER — Other Ambulatory Visit: Payer: Self-pay

## 2018-03-24 ENCOUNTER — Emergency Department (HOSPITAL_BASED_OUTPATIENT_CLINIC_OR_DEPARTMENT_OTHER)
Admission: EM | Admit: 2018-03-24 | Discharge: 2018-03-24 | Disposition: A | Discharge status: Home / Self Care | Payer: BLUE CROSS/BLUE SHIELD | Attending: Emergency Medicine | Admitting: Emergency Medicine

## 2018-03-24 ENCOUNTER — Encounter (HOSPITAL_BASED_OUTPATIENT_CLINIC_OR_DEPARTMENT_OTHER): Payer: Self-pay | Admitting: Adult Health

## 2018-03-24 DIAGNOSIS — I129 Hypertensive chronic kidney disease with stage 1 through stage 4 chronic kidney disease, or unspecified chronic kidney disease: Secondary | ICD-10-CM | POA: Diagnosis not present

## 2018-03-24 DIAGNOSIS — M79674 Pain in right toe(s): Secondary | ICD-10-CM | POA: Diagnosis present

## 2018-03-24 DIAGNOSIS — M10272 Drug-induced gout, left ankle and foot: Secondary | ICD-10-CM

## 2018-03-24 DIAGNOSIS — N183 Chronic kidney disease, stage 3 (moderate): Secondary | ICD-10-CM | POA: Insufficient documentation

## 2018-03-24 DIAGNOSIS — Z79899 Other long term (current) drug therapy: Secondary | ICD-10-CM | POA: Diagnosis not present

## 2018-03-24 DIAGNOSIS — M1A2710 Drug-induced chronic gout, right ankle and foot, without tophus (tophi): Secondary | ICD-10-CM | POA: Diagnosis not present

## 2018-03-24 LAB — BASIC METABOLIC PANEL
Anion gap: 9 (ref 5–15)
BUN: 32 mg/dL — AB (ref 6–20)
CHLORIDE: 100 mmol/L (ref 98–111)
CO2: 25 mmol/L (ref 22–32)
CREATININE: 1.52 mg/dL — AB (ref 0.61–1.24)
Calcium: 9.2 mg/dL (ref 8.9–10.3)
GFR calc Af Amer: 60 mL/min — ABNORMAL LOW (ref >60)
GFR calc non Af Amer: 52 mL/min — ABNORMAL LOW (ref >60)
GLUCOSE: 143 mg/dL — AB (ref 70–99)
Potassium: 3.8 mmol/L (ref 3.5–5.1)
SODIUM: 134 mmol/L — AB (ref 135–145)

## 2018-03-24 MED ORDER — COLCHICINE 0.6 MG PO TABS: 0.3000 mg | ORAL_TABLET | Freq: Every day | ORAL | 0 refills | Status: DC

## 2018-03-24 MED ORDER — LISINOPRIL 20 MG PO TABS: 20.0000 mg | ORAL_TABLET | Freq: Every day | ORAL | 0 refills | Status: DC

## 2018-03-24 NOTE — ED Triage Notes (Signed)
Presents with right great toe gout, he is taking HCTZ and lisinopril, and has had this before. He has an appointment Tuesday to change his medications. He is having pain to the right great toe and redness.

## 2018-03-24 NOTE — Discharge Instructions (Addendum)
Please stop taking your HCTZ- lisinopril combo pill.  We sent a new blood pressure medicine with only lisinopril.  You may take colchicine 0.3 mg per day for pain.  Please finish your prednisone course.  If you develop fevers, nausea, vomiting these would be reasons to return to care. Please follow up with your regular doctor after this acute gout flare because you may require medication to prevent flares in the future.

## 2018-03-24 NOTE — ED Provider Notes (Signed)
[facility] EMERGENCY DEPARTMENT Provider Note   CSN: [id number] Arrival date & time: 03/24/18  1030    History   Chief Complaint Chief Complaint  Patient presents with  . Gout    HPI Kevin Jefferson is a 53 y.o. male presenting for right great toe pain.  Patient was in his usual state of health until days ago when he developed left great toe pain with prior gout flares.  Patient was seen and evaluated in the emergency department where he was prescribed a prednisone burst and Norco for pain.  His pain improved, however subsequently returned which prompted him to come back into the emergency department today.  He denies any fevers, nausea, vomiting, diarrhea.  He has not felt systemically ill or had muscle aches.  He reports that his gout initial flare was in August 2019 after being started on HCTZ.  He continues to take HCTZ.  He has follow-up with his renal physician who manages his antihypertensive medications on Tuesday.     HPI  Past Medical History:  Diagnosis Date  . Gout   . Hypertension   . Stroke [facility])     Patient Active Problem List   Diagnosis Date Noted  . CVA (cerebral infarction) 04/03/2013  . Chest pain 04/01/2013  . Encephalopathy 04/01/2013  . Hypertensive urgency 04/01/2013  . Right sided weakness 04/01/2013  . CKD (chronic kidney disease) stage 3, GFR 30-59 ml/min (HCC) 04/01/2013  . XYVOPFYT(244.6) 04/01/2013    Past Surgical History:  Procedure Laterality Date  . ANKLE FRACTURE SURGERY    . TEE WITHOUT CARDIOVERSION N/A 04/03/2013   Procedure: TRANSESOPHAGEAL ECHOCARDIOGRAM (TEE);  Surgeon: Lewayne Bunting, MD;  Location: [facility] ENDOSCOPY;  Service: Cardiovascular;  Laterality: N/A;        Home Medications    Prior to Admission medications   Medication Sig Start Date End Date Taking? Authorizing Provider  amLODipine (NORVASC) 10 MG tablet Take 10 mg by mouth daily.    [provider]  colchicine 0.6 MG tablet Take 0.5 tablets  (0.3 mg total) by mouth daily. 03/24/18   Howard Pouch, MD  lisinopril (PRINIVIL,ZESTRIL) 20 MG tablet Take 1 tablet (20 mg total) by mouth daily. 03/24/18 03/24/19  Howard Pouch, MD  predniSONE (DELTASONE) 50 MG tablet Take 1 tablet (50 mg total) by mouth daily with breakfast. 03/19/18   Lawyer, Cristal Deer, PA-C  simvastatin (ZOCOR) 20 MG tablet Take 1 tablet (20 mg total) by mouth every evening. 04/04/13   Rai, Delene Ruffini, MD    Family History Family History  Problem Relation Age of Onset  . Hypertension Mother   . Irregular heart beat Maternal Grandmother   . Hypertension Maternal Grandmother     Social History Social History   Tobacco Use  . Smoking status: Never Smoker  . Smokeless tobacco: Never Used  Substance Use Topics  . Alcohol use: No  . Drug use: No     Allergies   Hydralazine hcl   Review of Systems Review of Systems  Constitutional: Negative for chills, diaphoresis, fatigue and fever.  HENT: Negative for congestion, rhinorrhea, sinus pain and sore throat.   Gastrointestinal: Negative for abdominal pain, diarrhea, nausea and vomiting.  Neurological: Negative for dizziness.     Physical Exam Updated Vital Signs BP (!) 154/77 (BP Location: Right Arm)   Pulse 86   Temp 98.3 F (36.8 C) (Oral)   Resp 18   Ht 5\' 11"  (1.803 m)   Wt 89.8 kg   SpO2 98%  BMI 27.62 kg/m   Physical Exam GEN: NAD, alert, cooperative, and pleasant. RESPIRATORY: Comfortable work of breathing, speaks in full sentences CV: Regular rate noted, distal extremities well perfused and warm without edema, palpable distal pulses GI: Soft, nondistended SKIN: warm and dry, no rashes or lesions NEURO: II-XII grossly intact MSK: Moves 4 extremities equally PSYCH: AAOx3, appropriate affect FOOT: painful left MTP with associated warmth and mild swelling, no redness, neurovascularly intact.  ED Treatments / Results  Labs (all labs ordered are listed, but only abnormal results are  displayed) Labs Reviewed  BASIC METABOLIC PANEL - Abnormal; Notable for the following components:      Result Value   Sodium 134 (*)    Glucose, Bld 143 (*)    BUN 32 (*)    Creatinine, Ser 1.52 (*)    GFR calc non Af Amer 52 (*)    GFR calc Af Amer 60 (*)    All other components within normal limits    EKG None  Radiology No results found.  Procedures Procedures (including critical care time)  Medications Ordered in ED Medications - No data to display   Initial Impression / Assessment and Plan / ED Course  I have reviewed the triage vital signs and the nursing notes.  Pertinent labs & imaging results that were available during my care of the patient were reviewed by me and considered in my medical decision making (see chart for details).  Acute gout flare Patient's last 2 pills left in his prednisone burst.  He continues to take HCTZ and and HCTZ lisinopril combo pill.  This is the likely trigger of his gout flares given that his first flare was in September after the HCTZ was started.  No dietary triggers of gout were identified.  No concern for acute joint infection or osteomyelitis given no redness, fevers, or systemic illness. - complete prednisone burst - discontinue HCTZ-lisinopril as HCTZ is likely trigger of gout - ordered lisinopril 20 mg monotherapy - patient to take lisinopril and continue norvasc for BP - patient to follow up with his renal doctor on Tuesday, BP may be checked at that time - colchicine ordered at renal dosing to be cautious, although CrCl was 60 mL/min  - he still has a norco pill at home to take this evening - red flags/reasons to return to care discussed      Final Clinical Impressions(s) / ED Diagnoses   Final diagnoses:  Acute drug-induced gout involving toe of left foot   ED Discharge Orders         Ordered    lisinopril (PRINIVIL,ZESTRIL) 20 MG tablet  Daily     03/24/18 1223    colchicine 0.6 MG tablet  Daily     03/24/18 1223            Howard Pouch, MD 03/24/18 1524    Gwyneth Sprout, MD 03/24/18 2052

## 2018-04-02 ENCOUNTER — Other Ambulatory Visit: Payer: Self-pay | Admitting: Student in an Organized Health Care Education/Training Program

## 2018-06-25 ENCOUNTER — Emergency Department (HOSPITAL_BASED_OUTPATIENT_CLINIC_OR_DEPARTMENT_OTHER): Payer: BC Managed Care – PPO

## 2018-06-25 ENCOUNTER — Encounter (HOSPITAL_BASED_OUTPATIENT_CLINIC_OR_DEPARTMENT_OTHER): Payer: Self-pay | Admitting: *Deleted

## 2018-06-25 ENCOUNTER — Emergency Department (HOSPITAL_COMMUNITY): Payer: BC Managed Care – PPO

## 2018-06-25 ENCOUNTER — Emergency Department (HOSPITAL_BASED_OUTPATIENT_CLINIC_OR_DEPARTMENT_OTHER)
Admission: EM | Admit: 2018-06-25 | Discharge: 2018-06-25 | Disposition: A | Discharge status: Home / Self Care | Payer: BC Managed Care – PPO | Attending: Emergency Medicine | Admitting: Emergency Medicine

## 2018-06-25 ENCOUNTER — Other Ambulatory Visit: Payer: Self-pay

## 2018-06-25 DIAGNOSIS — Z8673 Personal history of transient ischemic attack (TIA), and cerebral infarction without residual deficits: Secondary | ICD-10-CM | POA: Insufficient documentation

## 2018-06-25 DIAGNOSIS — I129 Hypertensive chronic kidney disease with stage 1 through stage 4 chronic kidney disease, or unspecified chronic kidney disease: Secondary | ICD-10-CM | POA: Insufficient documentation

## 2018-06-25 DIAGNOSIS — Z79899 Other long term (current) drug therapy: Secondary | ICD-10-CM | POA: Diagnosis not present

## 2018-06-25 DIAGNOSIS — M7989 Other specified soft tissue disorders: Secondary | ICD-10-CM | POA: Diagnosis not present

## 2018-06-25 DIAGNOSIS — M79672 Pain in left foot: Secondary | ICD-10-CM

## 2018-06-25 DIAGNOSIS — N183 Chronic kidney disease, stage 3 (moderate): Secondary | ICD-10-CM | POA: Diagnosis not present

## 2018-06-25 LAB — BASIC METABOLIC PANEL
Anion gap: 9 (ref 5–15)
BUN: 13 mg/dL (ref 6–20)
CO2: 23 mmol/L (ref 22–32)
Calcium: 8.8 mg/dL — ABNORMAL LOW (ref 8.9–10.3)
Chloride: 105 mmol/L (ref 98–111)
Creatinine, Ser: 1.41 mg/dL — ABNORMAL HIGH (ref 0.61–1.24)
GFR calc Af Amer: >60 mL/min (ref >60)
GFR calc non Af Amer: 56 mL/min — ABNORMAL LOW (ref >60)
Glucose, Bld: 129 mg/dL — ABNORMAL HIGH (ref 70–99)
Potassium: 3.5 mmol/L (ref 3.5–5.1)
Sodium: 137 mmol/L (ref 135–145)

## 2018-06-25 LAB — CBC WITH DIFFERENTIAL/PLATELET
Abs Immature Granulocytes: 0.02 10*3/uL (ref 0.00–0.07)
Basophils Absolute: 0 10*3/uL (ref 0.0–0.1)
Basophils Relative: 0 %
Eosinophils Absolute: 0.1 10*3/uL (ref 0.0–0.5)
Eosinophils Relative: 1 %
HCT: 36.4 % — ABNORMAL LOW (ref 39.0–52.0)
Hemoglobin: 11.7 g/dL — ABNORMAL LOW (ref 13.0–17.0)
Immature Granulocytes: 0 %
Lymphocytes Relative: 24 %
Lymphs Abs: 2 10*3/uL (ref 0.7–4.0)
MCH: 28.4 pg (ref 26.0–34.0)
MCHC: 32.1 g/dL (ref 30.0–36.0)
MCV: 88.3 fL (ref 80.0–100.0)
Monocytes Absolute: 0.6 10*3/uL (ref 0.1–1.0)
Monocytes Relative: 8 %
Neutro Abs: 5.6 10*3/uL (ref 1.7–7.7)
Neutrophils Relative %: 67 %
Platelets: 200 10*3/uL (ref 150–400)
RBC: 4.12 MIL/uL — ABNORMAL LOW (ref 4.22–5.81)
RDW: 13.6 % (ref 11.5–15.5)
WBC: 8.4 10*3/uL (ref 4.0–10.5)
nRBC: 0 % (ref 0.0–0.2)

## 2018-06-25 MED ORDER — PREDNISONE 20 MG PO TABS: 40.0000 mg | ORAL_TABLET | Freq: Every day | ORAL | 0 refills | Status: AC

## 2018-06-25 MED ORDER — HYDROCODONE-ACETAMINOPHEN 5-325 MG PO TABS
2.0000 | ORAL_TABLET | Freq: Once | ORAL | Status: AC
  Administered 2018-06-25: 2 via ORAL
  Filled 2018-06-25: qty 2

## 2018-06-25 MED ORDER — HYDROCODONE-ACETAMINOPHEN 5-325 MG PO TABS: 1.0000 | ORAL_TABLET | Freq: Four times a day (QID) | ORAL | 0 refills | Status: AC | PRN

## 2018-06-25 NOTE — ED Triage Notes (Addendum)
Pt c/o left ankle/foot pain x 3 days with swelling. Hx of gout in both big toes. Denies injury. 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

## 2018-06-25 NOTE — Discharge Instructions (Signed)
As we discussed, your work-up today was reassuring.  We will plan to treat this as an acute gout flare.  Take pain medications as directed.  Take prednisone as directed.  Follow-up with your primary care doctor in the next 2 to 4 days.  Return the emergency department for any fevers, worsening pain, inability to move the leg, redness or swelling that starts to spread or any other worsening or concerning symptoms.

## 2018-06-25 NOTE — ED Provider Notes (Addendum)
[facility] EMERGENCY DEPARTMENT Provider Note   CSN: [id number] Arrival date & time: 06/25/18  1602    History   Chief Complaint Chief Complaint  Patient presents with  . Foot Pain    HPI Kevin Jefferson is a 53 y.o. male past history of gout, hypertension, CVA who presents for evaluation of left lower extremity pain and swelling x4 days.  He states that initially, he noticed some pain and swelling around the ankle and states that since then, his spread to his foot and lower leg.  He states he has noticed some overlying warmth and erythema.  He states that pain is worsened with movement and with walking.  He does not have any numbness/weakness.  He has not had any fevers.  He does have a history of gout and states that usually he has it in his big toes bilaterally.  He states that this feels different than his gout.  He states that normally, when he has a flare, he has significant pain with even light touch.  He describes this more as an aching type pain.  His most recent flareup was in March 2020 which they thought was caused by his blood pressure medication.  He was switched to lisinopril and has been taking that since March.  Patient states that he is not having any chest pain or difficulty breathing.  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  He denies any history of blood clots in his legs or lungs, recent surgeries, recent hospitalizations.     The history is provided by the patient.    Past Medical History:  Diagnosis Date  . Gout   . Hypertension   . Stroke [facility](HCC)     Patient Active Problem List   Diagnosis Date Noted  . CVA (cerebral infarction) 04/03/2013  . Chest pain 04/01/2013  . Encephalopathy 04/01/2013  . Hypertensive urgency 04/01/2013  . Right sided weakness 04/01/2013  . CKD (chronic kidney disease) stage 3, GFR 30-59 ml/min (HCC) 04/01/2013  . WUJWJXBJ(478.2Headache(784.0) 04/01/2013    Past  Surgical History:  Procedure Laterality Date  . ANKLE FRACTURE SURGERY    . TEE WITHOUT CARDIOVERSION N/A 04/03/2013   Procedure: TRANSESOPHAGEAL ECHOCARDIOGRAM (TEE);  Surgeon: Lewayne BuntingBrian S Crenshaw, MD;  [facility];  Service: Cardiovascular;  Laterality: N/A;        Home Medications    Prior to Admission medications   Medication Sig Start Date End Date Taking? Authorizing Provider  amLODipine (NORVASC) 10 MG tablet Take 10 mg by mouth daily.    [provider]  colchicine 0.6 MG tablet Take 0.5 tablets (0.3 mg total) by mouth daily. 03/24/18   Howard PouchFeng, Lauren, MD  HYDROcodone-acetaminophen (NORCO/VICODIN) 5-325 MG tablet Take 1-2 tablets by mouth every 6 (six) hours as needed. 06/25/18   Maxwell CaulLayden,  A, PA-C  lisinopril (PRINIVIL,ZESTRIL) 20 MG tablet Take 1 tablet (20 mg total) by mouth daily. 03/24/18 03/24/19  Howard PouchFeng, Lauren, MD  predniSONE (DELTASONE) 20 MG tablet Take 2 tablets (40 mg total) by mouth daily for 4 days. 06/25/18 06/29/18  Maxwell CaulLayden,  A, PA-C  simvastatin (ZOCOR) 20 MG tablet Take 1 tablet (20 mg total) by mouth every evening. 04/04/13   Rai, Delene Ruffiniipudeep K, MD    Family History Family History  Problem Relation Age of Onset  . Hypertension Mother   . Irregular heart beat Maternal Grandmother   . Hypertension Maternal Grandmother  Social History Social History   Tobacco Use  . Smoking status: Never Smoker  . Smokeless tobacco: Never Used  Substance Use Topics  . Alcohol use: No  . Drug use: No     Allergies   Hydralazine hcl   Review of Systems Review of Systems  Constitutional: Negative for fever.  Respiratory: Negative for shortness of breath.   Cardiovascular: Positive for leg swelling. Negative for chest pain.  Musculoskeletal:       LLE pain  Skin: Positive for color change.  Neurological: Negative for weakness and numbness.  All other systems reviewed and are negative.    Physical Exam Updated Vital Signs BP (!) 149/100  (BP Location: Right Arm)   Pulse 84   Temp 98.7 F (37.1 C) (Oral)   Resp 19   Ht 5\' 11"  (1.803 m)   Wt 90.7 kg   SpO2 98%   BMI 27.89 kg/m   Physical Exam Vitals signs and nursing note reviewed.  Constitutional:      Appearance: He is well-developed.  HENT:     Head: Normocephalic and atraumatic.  Eyes:     General: No scleral icterus.       Right eye: No discharge.        Left eye: No discharge.     Conjunctiva/sclera: Conjunctivae normal.  Cardiovascular:     Rate and Rhythm: Normal rate and regular rhythm.     Pulses:          Radial pulses are 2+ on the right side and 2+ on the left side.       Dorsalis pedis pulses are 2+ on the right side and 2+ on the left side.  Pulmonary:     Effort: Pulmonary effort is normal.     Comments: Lungs clear to auscultation bilaterally.  Symmetric chest rise.  No wheezing, rales, rhonchi. Musculoskeletal:     Comments: Nonpitting edema noted to the left lower extremity that begins at about distal third of the tib-fib and extends distally towards the ankle.  Dorsiflexion and plantar flexion intact.  No overlying erythema but some mild overlying warmth.  No tenderness palpation noted to left calf.  No tenderness palpation noted to right lower extremity with no overlying edema, warmth, erythema.  Skin:    General: Skin is warm and dry.     Capillary Refill: Capillary refill takes less than 2 seconds.     Comments: Good distal cap refill. LLE is not dusky in appearance or cool to touch.  Neurological:     Mental Status: He is alert.     Comments: Sensation intact along major nerve distributions of BUE  Psychiatric:        Speech: Speech normal.        Behavior: Behavior normal.      ED Treatments / Results  Labs (all labs ordered are listed, but only abnormal results are displayed) Labs Reviewed  BASIC METABOLIC PANEL - Abnormal; Notable for the following components:      Result Value   Glucose, Bld 129 (*)    Creatinine, Ser  1.41 (*)    Calcium 8.8 (*)    GFR calc non Af Amer 56 (*)    All other components within normal limits  CBC WITH DIFFERENTIAL/PLATELET - Abnormal; Notable for the following components:   RBC 4.12 (*)    Hemoglobin 11.7 (*)    HCT 36.4 (*)    All other components within normal limits    EKG None  Radiology [place] Venous Img Lower  Left (dvt Study)  Result Date: 06/25/2018 CLINICAL DATA:  C/o Lt anterior foot/ankle pain/swelling (at the joint space) x 3-4 days. Patient is a IT trainertrucker. Hx gout, Lt ankle fx repair in 1998 EXAM: LEFT LOWER EXTREMITY VENOUS DOPPLER ULTRASOUND TECHNIQUE: Gray-scale sonography with graded compression, as well as color Doppler and duplex ultrasound were performed to evaluate the lower extremity deep venous systems from the level of the common femoral vein and including the common femoral, femoral, profunda femoral, popliteal and calf veins including the posterior tibial, peroneal and gastrocnemius veins when visible. The superficial great saphenous vein was also interrogated. Spectral Doppler was utilized to evaluate flow at rest and with distal augmentation maneuvers in the common femoral, femoral and popliteal veins. COMPARISON:  None. FINDINGS: Contralateral Common Femoral Vein: Respiratory phasicity is normal and symmetric with the symptomatic side. No evidence of thrombus. Normal compressibility. Common Femoral Vein: No evidence of thrombus. Normal compressibility, respiratory phasicity and response to augmentation. Saphenofemoral Junction: No evidence of thrombus. Normal compressibility and flow on color Doppler imaging. Profunda Femoral Vein: No evidence of thrombus. Normal compressibility and flow on color Doppler imaging. Femoral Vein: No evidence of thrombus. Normal compressibility, respiratory phasicity and response to augmentation. Popliteal Vein: No evidence of thrombus. Normal compressibility, respiratory phasicity and response to augmentation. Calf Veins: No  evidence of thrombus. Normal compressibility and flow on color Doppler imaging. Superficial Great Saphenous Vein: No evidence of thrombus. Normal compressibility. Venous Reflux:  None. Other Findings:  None. IMPRESSION: No evidence of deep venous thrombosis. Electronically Signed   By: Amie Portlandavid  Ormond M.D.   On: 06/25/2018 17:37   Dg Foot Complete Left  Result Date: 06/25/2018 CLINICAL DATA:  Left foot and ankle swelling for 3-4 days. No injury. EXAM: LEFT FOOT - COMPLETE 3+ VIEW COMPARISON:  None. FINDINGS: No fracture or bone lesion. Mild hallux valgus deformity. Remaining joints normally spaced and aligned. Soft tissues are unremarkable. IMPRESSION: No fracture or acute finding. Electronically Signed   By: Amie Portlandavid  Ormond M.D.   On: 06/25/2018 17:51    Procedures Procedures (including critical care time)  Medications Ordered in ED Medications  HYDROcodone-acetaminophen (NORCO/VICODIN) 5-325 MG per tablet 2 tablet (2 tablets Oral Given 06/25/18 1857)     Initial Impression / Assessment and Plan / ED Course  I have reviewed the triage vital signs and the nursing notes.  Pertinent labs & imaging results that were available during my care of the patient were reviewed by me and considered in my medical decision making (see chart for details).        53 year old male who presents for evaluation left lower leg pain, swelling x4 days.  History of gout and states that is been in his toes.  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  No chest pain, shortness of breath. Patient is afebrile, non-toxic appearing, sitting comfortably on examination table. Vital signs reviewed and stable.  Exam, left lower extremity with nonpitting edema noted with some mild overlying warmth.  Given concerns for asymmetric swelling and long travel, will plan for evaluation of possible DVT.  Also consider gout.  History/physical exam not concerning for  septic arthritis, acute arterial embolism.  CBC with no evidence of leukocytosis.  Hemoglobin 11.7.  BMP shows BUN of 13, creatinine of 1.41.  3- for any acute bony abnormality.  Ultrasound of leg shows no evidence of DVT.  Given  lack of overlying erythema, fever and no leukocytosis on exam, do not feel that he needs treated for cellulitis.  We will plan to treat for short course of gout.  At this time, given his creatinine, will hold off on colchicine.  We will plan to send home with short course of pain medication, prednisone.  At this time, his exam is not concerning for septic arthritis.  He can move the ankle joint and the point tenderness is mostly located on the lateral aspect of ankle.  He has no overlying erythema that extends across ankle joint that would be concerning for septic arthritis.  I did discuss with patient that he is to closely monitor his symptoms and return if he starts experiencing any worsening or concerning symptoms. Discussed patient with Dr. Beverely Pace who is agreeable to plan.  Encourage follow-up with his primary care doctor. At this time, patient exhibits no emergent life-threatening condition that require further evaluation in ED or admission. Patient had ample opportunity for questions and discussion. All patient's questions were answered with full understanding. Strict return precautions discussed. Patient expresses understanding and agreement to plan.   Portions of this note were generated with [employer]. Dictation errors may occur despite best attempts at proofreading.   Final Clinical Impressions(s) / ED Diagnoses   Final diagnoses:  Left foot pain    ED Discharge Orders         Ordered    HYDROcodone-acetaminophen (NORCO/VICODIN) 5-325 MG tablet  Every 6 hours PRN     06/25/18 1912    predniSONE (DELTASONE) 20 MG tablet  Daily     06/25/18 1912           Volanda Napoleon, PA-C 06/25/18 2235    Volanda Napoleon, PA-C 06/25/18 2236     Sherwood Gambler, MD 06/25/18 2253

## 2018-06-25 NOTE — ED Notes (Signed)
Patient transported to Ultrasound 

## 2019-03-29 ENCOUNTER — Ambulatory Visit: Payer: BC Managed Care – PPO | Attending: Family

## 2019-03-29 DIAGNOSIS — Z23 Encounter for immunization: Secondary | ICD-10-CM

## 2019-03-29 NOTE — Progress Notes (Signed)
   Covid-19 Vaccination Clinic  Name:  Arlan Birks    MRN: [id number] DOB: 04-29-1965  03/29/2019  Mr. Robles was observed post Covid-19 immunization for 15 minutes without incident. He was provided with Vaccine Information Sheet and instruction to access the V-Safe system.   Mr. Avans was instructed to call 911 with any severe reactions post vaccine: Marland Kitchen Difficulty breathing  . Swelling of face and throat  . A fast heartbeat  . A bad rash all over body  . Dizziness and weakness   Immunizations Administered    Name Date Dose VIS Date Route   Moderna COVID-19 Vaccine 03/29/2019  4:26 PM 0.5 mL 12/12/2018 Intramuscular   Manufacturer: Moderna   Lot: [id number]   NDC: 30940-768-08

## 2019-05-01 ENCOUNTER — Ambulatory Visit: Payer: BC Managed Care – PPO

## 2019-05-10 ENCOUNTER — Ambulatory Visit: Payer: BC Managed Care – PPO | Attending: Family

## 2019-05-10 DIAGNOSIS — Z23 Encounter for immunization: Secondary | ICD-10-CM

## 2019-05-10 NOTE — Progress Notes (Signed)
   Covid-19 Vaccination Clinic  Name:  Kevin Jefferson    MRN: [id number] DOB: May 01, 1965  05/10/2019  [name] was observed post Covid-19 immunization for 15 minutes without incident. He was provided with Vaccine Information Sheet and instruction to access the V-Safe system.   [name] was instructed to call 911 with any severe reactions post vaccine: Marland Kitchen Difficulty breathing  . Swelling of face and throat  . A fast heartbeat  . A bad rash all over body  . Dizziness and weakness   Immunizations Administered    Name Date Dose VIS Date Route   Moderna COVID-19 Vaccine 05/10/2019  5:05 PM 0.5 mL 12/2018 Intramuscular   Manufacturer: Moderna   Lot: [id number]   NDC: 99357-017-79

## 2019-07-31 ENCOUNTER — Other Ambulatory Visit: Payer: Self-pay

## 2019-07-31 ENCOUNTER — Encounter (HOSPITAL_BASED_OUTPATIENT_CLINIC_OR_DEPARTMENT_OTHER): Payer: Self-pay | Admitting: *Deleted

## 2019-07-31 ENCOUNTER — Emergency Department (HOSPITAL_BASED_OUTPATIENT_CLINIC_OR_DEPARTMENT_OTHER)
Admission: EM | Admit: 2019-07-31 | Discharge: 2019-07-31 | Disposition: A | Discharge status: Home / Self Care | Payer: BC Managed Care – PPO | Attending: Emergency Medicine | Admitting: Emergency Medicine

## 2019-07-31 DIAGNOSIS — N183 Chronic kidney disease, stage 3 unspecified: Secondary | ICD-10-CM | POA: Diagnosis not present

## 2019-07-31 DIAGNOSIS — I129 Hypertensive chronic kidney disease with stage 1 through stage 4 chronic kidney disease, or unspecified chronic kidney disease: Secondary | ICD-10-CM | POA: Diagnosis not present

## 2019-07-31 DIAGNOSIS — Z79899 Other long term (current) drug therapy: Secondary | ICD-10-CM | POA: Insufficient documentation

## 2019-07-31 DIAGNOSIS — L739 Follicular disorder, unspecified: Secondary | ICD-10-CM

## 2019-07-31 DIAGNOSIS — R21 Rash and other nonspecific skin eruption: Secondary | ICD-10-CM | POA: Diagnosis present

## 2019-07-31 DIAGNOSIS — L662 Folliculitis decalvans: Secondary | ICD-10-CM | POA: Diagnosis not present

## 2019-07-31 MED ORDER — DOXYCYCLINE HYCLATE 100 MG PO CAPS: 100.0000 mg | ORAL_CAPSULE | Freq: Two times a day (BID) | ORAL | 0 refills | Status: AC

## 2019-07-31 MED ORDER — DOXYCYCLINE HYCLATE 100 MG PO TABS
100.0000 mg | ORAL_TABLET | Freq: Once | ORAL | Status: AC
  Administered 2019-07-31: 100 mg via ORAL
  Filled 2019-07-31: qty 1

## 2019-07-31 NOTE — ED Triage Notes (Signed)
Pt c/o rash x 3 days   ?

## 2019-07-31 NOTE — ED Provider Notes (Signed)
[facility] EMERGENCY DEPARTMENT Provider Note   CSN: [id number] Arrival date & time: 07/31/19  2136     History Chief Complaint  Patient presents with  . Rash    Kevin Jefferson is a 54 y.o. male.  HPI   54 year old male with a history of gout, hypertension, CVA, who presents the emergency department today for evaluation of a rash to his head.  He states that he got the Covid vaccine back in May and following this he developed a rash to the top of his head that is pruritic.  He denies any fevers associated with this.  He was seen by his PCP and was started on medication which completely resolved his symptoms.  States that the rash popped up again about 3 days ago.  His symptoms are exactly the same as prior.  He denies any fevers.  Past Medical History:  Diagnosis Date  . Gout   . Hypertension   . Stroke [facility])     Patient Active Problem List   Diagnosis Date Noted  . CVA (cerebral infarction) 04/03/2013  . Chest pain 04/01/2013  . Encephalopathy 04/01/2013  . Hypertensive urgency 04/01/2013  . Right sided weakness 04/01/2013  . CKD (chronic kidney disease) stage 3, GFR 30-59 ml/min 04/01/2013  . IRSWNIOE(703.5) 04/01/2013    Past Surgical History:  Procedure Laterality Date  . ANKLE FRACTURE SURGERY    . TEE WITHOUT CARDIOVERSION N/A 04/03/2013   Procedure: TRANSESOPHAGEAL ECHOCARDIOGRAM (TEE);  Surgeon: Lewayne Bunting, MD;  Location: [facility] ENDOSCOPY;  Service: Cardiovascular;  Laterality: N/A;       Family History  Problem Relation Age of Onset  . Hypertension Mother   . Irregular heart beat Maternal Grandmother   . Hypertension Maternal Grandmother     Social History   Tobacco Use  . Smoking status: Never Smoker  . Smokeless tobacco: Never Used  Substance Use Topics  . Alcohol use: No  . Drug use: No    Home Medications Prior to Admission medications   Medication Sig Start Date End Date Taking? Authorizing Provider  amLODipine (NORVASC) 10 MG  tablet Take 10 mg by mouth daily.    [provider]  colchicine 0.6 MG tablet Take 0.5 tablets (0.3 mg total) by mouth daily. 03/24/18   Howard Pouch, MD  doxycycline (VIBRAMYCIN) 100 MG capsule Take 1 capsule (100 mg total) by mouth 2 (two) times daily for 7 days. 07/31/19 08/07/19  Jedaiah Rathbun S, PA-C  HYDROcodone-acetaminophen (NORCO/VICODIN) 5-325 MG tablet Take 1-2 tablets by mouth every 6 (six) hours as needed. 06/25/18   Maxwell Caul, PA-C  lisinopril (PRINIVIL,ZESTRIL) 20 MG tablet Take 1 tablet (20 mg total) by mouth daily. 03/24/18 03/24/19  Howard Pouch, MD  simvastatin (ZOCOR) 20 MG tablet Take 1 tablet (20 mg total) by mouth every evening. 04/04/13   Rai, Delene Ruffini, MD    Allergies    Hydralazine hcl  Review of Systems   Review of Systems  Constitutional: Negative for fever.  Skin: Positive for rash.    Physical Exam Updated Vital Signs BP (!) 162/112   Pulse 87   Temp 98.2 F (36.8 C)   Resp 16   Ht 5\' 11"  (1.803 m)   Wt 95.3 kg   SpO2 100%   BMI 29.29 kg/m   Physical Exam Constitutional:      General: He is not in acute distress.    Appearance: He is well-developed.  Eyes:     Conjunctiva/sclera: Conjunctivae normal.  Cardiovascular:     Rate and Rhythm: Normal rate.  Pulmonary:     Effort: Pulmonary effort is normal.  Skin:    General: Skin is warm and dry.     Comments: Pustular areas noted to the posterior scalp along the hairline.  Some areas are scabbed.  There is no fluctuance or significant induration noted.  Neurological:     Mental Status: He is alert and oriented to person, place, and time.          ED Results / Procedures / Treatments   Labs (all labs ordered are listed, but only abnormal results are displayed) Labs Reviewed - No data to display  EKG None  Radiology No results found.  Procedures Procedures (including critical care time)  Medications Ordered in ED Medications - No data to display  ED Course  I  have reviewed the triage vital signs and the nursing notes.  Pertinent labs & imaging results that were available during my care of the patient were reviewed by me and considered in my medical decision making (see chart for details).    MDM Rules/Calculators/A&P                          54 year old male presenting for evaluation of a rash.  Had similar rash earlier this year and on chart review per care everywhere patient was diagnosed with folliculitis and treated with doxycycline.  He states that this completely resolved his symptoms.  On exam, pustular areas noted to the posterior scalp along the hairline.  Some areas are scabbed.  There is no fluctuance or significant induration noted.   Suspect that this is recurrence of his folliculitis.  Will treat with doxycycline him follow-up with PCP.  Have advised him to return to the emergency department for any new or worsening symptoms.  He voices understanding of the plan and reasons to return.  All questions answered.  Patient able for discharge.  Final Clinical Impression(s) / ED Diagnoses Final diagnoses:  Folliculitis    Rx / DC Orders ED Discharge Orders         Ordered    doxycycline (VIBRAMYCIN) 100 MG capsule  2 times daily     Discontinue  Reprint     07/31/19 2211           Karrie Meres, PA-C 07/31/19 2211    Tilden Fossa, MD 07/31/19 [phone]

## 2019-07-31 NOTE — Discharge Instructions (Signed)
You were given a prescription for antibiotics. Please take the antibiotic prescription fully.   Please follow up with your primary care provider within 5-7 days for re-evaluation of your symptoms. If you do not have a primary care provider, information for a healthcare clinic has been provided for you to make arrangements for follow up care. Please return to the emergency department for any new or worsening symptoms.  

## 2019-12-10 ENCOUNTER — Emergency Department (HOSPITAL_BASED_OUTPATIENT_CLINIC_OR_DEPARTMENT_OTHER)
Admission: EM | Admit: 2019-12-10 | Discharge: 2019-12-10 | Disposition: A | Discharge status: Home / Self Care | Payer: BC Managed Care – PPO | Attending: Emergency Medicine | Admitting: Emergency Medicine

## 2019-12-10 ENCOUNTER — Emergency Department (HOSPITAL_BASED_OUTPATIENT_CLINIC_OR_DEPARTMENT_OTHER): Payer: BC Managed Care – PPO

## 2019-12-10 ENCOUNTER — Encounter (HOSPITAL_BASED_OUTPATIENT_CLINIC_OR_DEPARTMENT_OTHER): Payer: Self-pay | Admitting: *Deleted

## 2019-12-10 ENCOUNTER — Other Ambulatory Visit: Payer: Self-pay

## 2019-12-10 DIAGNOSIS — I129 Hypertensive chronic kidney disease with stage 1 through stage 4 chronic kidney disease, or unspecified chronic kidney disease: Secondary | ICD-10-CM | POA: Insufficient documentation

## 2019-12-10 DIAGNOSIS — J069 Acute upper respiratory infection, unspecified: Secondary | ICD-10-CM | POA: Diagnosis not present

## 2019-12-10 DIAGNOSIS — N183 Chronic kidney disease, stage 3 unspecified: Secondary | ICD-10-CM | POA: Diagnosis not present

## 2019-12-10 DIAGNOSIS — Z79899 Other long term (current) drug therapy: Secondary | ICD-10-CM | POA: Insufficient documentation

## 2019-12-10 DIAGNOSIS — Z20822 Contact with and (suspected) exposure to covid-19: Secondary | ICD-10-CM | POA: Diagnosis not present

## 2019-12-10 DIAGNOSIS — R059 Cough, unspecified: Secondary | ICD-10-CM | POA: Diagnosis present

## 2019-12-10 LAB — RESP PANEL BY RT-PCR (FLU A&B, COVID) ARPGX2
Influenza A by PCR: NEGATIVE
Influenza B by PCR: NEGATIVE
SARS Coronavirus 2 by RT PCR: NEGATIVE

## 2019-12-10 NOTE — ED Triage Notes (Signed)
Nasal congestion x 2 weeks. Sob and fatigue for a week.

## 2019-12-10 NOTE — Discharge Instructions (Addendum)
Follow-up with primary care doctor for further medication adjustment for your blood pressure.

## 2019-12-10 NOTE — ED Notes (Signed)
Discharge instructions discussed with patient. Unable to sign d/t computer malfunction. Departs ED at this time instable condition.

## 2019-12-10 NOTE — ED Provider Notes (Signed)
[facility] EMERGENCY DEPARTMENT Provider Note   CSN: [id number] Arrival date & time: 12/10/19  1903     History Chief Complaint  Patient presents with  . Nasal Congestion    Kevin Jefferson is a 54 y.o. male.  The history is provided by the patient.  URI Presenting symptoms: congestion   Presenting symptoms: no cough, no ear pain, no fever and no sore throat   Onset quality:  Gradual Timing:  Intermittent Progression:  Waxing and waning Chronicity:  New Relieved by:  Nothing Worsened by:  Nothing Associated symptoms: no arthralgias, no headaches, no myalgias, no neck pain, no sinus pain, no sneezing, no swollen glands and no wheezing        Past Medical History:  Diagnosis Date  . Gout   . Hypertension   . Stroke [facility])     Patient Active Problem List   Diagnosis Date Noted  . CVA (cerebral infarction) 04/03/2013  . Chest pain 04/01/2013  . Encephalopathy 04/01/2013  . Hypertensive urgency 04/01/2013  . Right sided weakness 04/01/2013  . CKD (chronic kidney disease) stage 3, GFR 30-59 ml/min (HCC) 04/01/2013  . OEUMPNTI(144.3) 04/01/2013    Past Surgical History:  Procedure Laterality Date  . ANKLE FRACTURE SURGERY    . TEE WITHOUT CARDIOVERSION N/A 04/03/2013   Procedure: TRANSESOPHAGEAL ECHOCARDIOGRAM (TEE);  Surgeon: Lewayne Bunting, MD;  Location: [facility] ENDOSCOPY;  Service: Cardiovascular;  Laterality: N/A;       Family History  Problem Relation Age of Onset  . Hypertension Mother   . Irregular heart beat Maternal Grandmother   . Hypertension Maternal Grandmother     Social History   Tobacco Use  . Smoking status: Never Smoker  . Smokeless tobacco: Never Used  Substance Use Topics  . Alcohol use: No  . Drug use: No    Home Medications Prior to Admission medications   Medication Sig Start Date End Date Taking? Authorizing Provider  amLODipine (NORVASC) 10 MG tablet Take 10 mg by mouth daily.    [provider]  colchicine  0.6 MG tablet Take 0.5 tablets (0.3 mg total) by mouth daily. 03/24/18   Howard Pouch, MD  HYDROcodone-acetaminophen (NORCO/VICODIN) 5-325 MG tablet Take 1-2 tablets by mouth every 6 (six) hours as needed. 06/25/18   Maxwell Caul, PA-C  lisinopril (PRINIVIL,ZESTRIL) 20 MG tablet Take 1 tablet (20 mg total) by mouth daily. 03/24/18 03/24/19  Howard Pouch, MD  simvastatin (ZOCOR) 20 MG tablet Take 1 tablet (20 mg total) by mouth every evening. 04/04/13   Rai, Delene Ruffini, MD    Allergies    Hydralazine hcl  Review of Systems   Review of Systems  Constitutional: Negative for chills and fever.  HENT: Positive for congestion. Negative for ear pain, sinus pain, sneezing and sore throat.   Eyes: Negative for pain and visual disturbance.  Respiratory: Negative for cough, shortness of breath and wheezing.   Cardiovascular: Negative for chest pain and palpitations.  Gastrointestinal: Negative for abdominal pain and vomiting.  Genitourinary: Negative for dysuria and hematuria.  Musculoskeletal: Negative for arthralgias, back pain, myalgias and neck pain.  Skin: Negative for color change and rash.  Neurological: Negative for seizures, syncope and headaches.  All other systems reviewed and are negative.   Physical Exam Updated Vital Signs BP (!) 204/130   Pulse 82   Temp 98 F (36.7 C) (Oral)   Resp 16   Ht 5\' 11"  (1.803 m)   Wt 99.8 kg   SpO2 99%  BMI 30.68 kg/m   Physical Exam Vitals and nursing note reviewed.  Constitutional:      General: He is not in acute distress.    Appearance: He is well-developed. He is not ill-appearing.  HENT:     Head: Normocephalic and atraumatic.     Nose: Congestion present.     Mouth/Throat:     Mouth: Mucous membranes are moist.  Eyes:     Conjunctiva/sclera: Conjunctivae normal.     Pupils: Pupils are equal, round, and reactive to light.  Cardiovascular:     Rate and Rhythm: Normal rate and regular rhythm.     Pulses: Normal pulses.      Heart sounds: Normal heart sounds. No murmur heard.   Pulmonary:     Effort: Pulmonary effort is normal. No respiratory distress.     Breath sounds: Normal breath sounds.  Abdominal:     Palpations: Abdomen is soft.     Tenderness: There is no abdominal tenderness.  Musculoskeletal:     Cervical back: Neck supple.     Right lower leg: Edema (1+) present.     Left lower leg: Edema (1+) present.  Skin:    General: Skin is warm and dry.     Capillary Refill: Capillary refill takes less than 2 seconds.  Neurological:     General: No focal deficit present.     Mental Status: He is alert.     ED Results / Procedures / Treatments   Labs (all labs ordered are listed, but only abnormal results are displayed) Labs Reviewed  RESP PANEL BY RT-PCR (FLU A&B, COVID) ARPGX2    EKG EKG Interpretation  Date/Time:  Monday December 10 2019 19:43:07 EST Ventricular Rate:  83 PR Interval:  160 QRS Duration: 102 QT Interval:  378 QTC Calculation: 444 R Axis:   -48 Text Interpretation: Normal sinus rhythm Incomplete right bundle branch block Confirmed by Virgina Norfolk [phone]) on 12/10/2019 10:12:17 PM   Radiology DG Chest 2 View  Result Date: 12/10/2019 CLINICAL DATA:  Shortness of breath, congestion for 2 weeks EXAM: CHEST - 2 VIEW COMPARISON:  Radiograph 04/16/2014, CT 04/01/2013 FINDINGS: No consolidation, features of edema, pneumothorax, or effusion. Pulmonary vascularity is normally distributed. The cardiomediastinal contours are unremarkable. No acute osseous or soft tissue abnormality. Degenerative changes are present in the imaged spine and shoulders. IMPRESSION: No acute cardiopulmonary abnormality. Electronically Signed   By: Kreg Shropshire M.D.   On: 12/10/2019 21:54    Procedures Procedures (including critical care time)  Medications Ordered in ED Medications - No data to display  ED Course  I have reviewed the triage vital signs and the nursing notes.  Pertinent labs &  imaging results that were available during my care of the patient were reviewed by me and considered in my medical decision making (see chart for details).    MDM Rules/Calculators/A&P                          Kevin Jefferson is here with URI symptoms for the past week or 2.  Blood pressure elevated upon arrival but has not taken blood pressure medication for the last 2 days.  Normal vitals otherwise.  No chest pain, no shortness of breath.  Has had some nasal congestion, fatigue.  Covid testing negative and influenza testing negative.  Chest x-ray with no signs of pneumonia, no pneumothorax, no pleural effusion.  Does have some edema in his legs but states that is  chronic.  Overall suspect viral or allergic process.  Not having any chest pain or severe shortness of breath.  Chronically elevated blood pressure and fairly asymptomatic from that standpoint.  States that he has bad about taking his medication through the holidays and recommend that he continue his medication.  He states that he does have prescription for all of his current medications.  Recommend follow-up with primary care doctor and discharged from ED in good condition.  This chart was dictated using voice recognition software.  Despite best efforts to proofread,  errors can occur which can change the documentation meaning.    Final Clinical Impression(s) / ED Diagnoses Final diagnoses:  Viral upper respiratory tract infection    Rx / DC Orders ED Discharge Orders    None       Virgina Norfolk, DO 12/10/19 2243

## 2020-08-21 ENCOUNTER — Other Ambulatory Visit: Payer: Self-pay

## 2020-08-21 ENCOUNTER — Encounter (HOSPITAL_BASED_OUTPATIENT_CLINIC_OR_DEPARTMENT_OTHER): Payer: Self-pay | Admitting: *Deleted

## 2020-08-21 ENCOUNTER — Emergency Department (HOSPITAL_BASED_OUTPATIENT_CLINIC_OR_DEPARTMENT_OTHER)
Admission: EM | Admit: 2020-08-21 | Discharge: 2020-08-21 | Disposition: A | Discharge status: Home / Self Care | Payer: BC Managed Care – PPO | Attending: Emergency Medicine | Admitting: Emergency Medicine

## 2020-08-21 DIAGNOSIS — Z79899 Other long term (current) drug therapy: Secondary | ICD-10-CM | POA: Insufficient documentation

## 2020-08-21 DIAGNOSIS — M791 Myalgia, unspecified site: Secondary | ICD-10-CM | POA: Diagnosis not present

## 2020-08-21 DIAGNOSIS — Z20822 Contact with and (suspected) exposure to covid-19: Secondary | ICD-10-CM | POA: Diagnosis not present

## 2020-08-21 DIAGNOSIS — J029 Acute pharyngitis, unspecified: Secondary | ICD-10-CM | POA: Insufficient documentation

## 2020-08-21 DIAGNOSIS — R509 Fever, unspecified: Secondary | ICD-10-CM | POA: Insufficient documentation

## 2020-08-21 DIAGNOSIS — R0981 Nasal congestion: Secondary | ICD-10-CM | POA: Diagnosis not present

## 2020-08-21 DIAGNOSIS — I129 Hypertensive chronic kidney disease with stage 1 through stage 4 chronic kidney disease, or unspecified chronic kidney disease: Secondary | ICD-10-CM | POA: Insufficient documentation

## 2020-08-21 DIAGNOSIS — N183 Chronic kidney disease, stage 3 unspecified: Secondary | ICD-10-CM | POA: Diagnosis not present

## 2020-08-21 DIAGNOSIS — B349 Viral infection, unspecified: Secondary | ICD-10-CM

## 2020-08-21 LAB — RESP PANEL BY RT-PCR (FLU A&B, COVID) ARPGX2
Influenza A by PCR: NEGATIVE
Influenza B by PCR: NEGATIVE
SARS Coronavirus 2 by RT PCR: NEGATIVE

## 2020-08-21 NOTE — Discharge Instructions (Addendum)
Please take your blood pressure medicine, your blood pressure was elevated here in the ED.  Take Tylenol and Motrin as needed for body aches and fever.  You may to routinely use lozenges as needed for sore throat.  He did not have COVID or flu, but you have a viral infection.  This will pass on its own without any assistance of antibiotics.

## 2020-08-21 NOTE — ED Triage Notes (Signed)
C/o cough , fever , h/a body aches x 4 days

## 2020-08-21 NOTE — ED Provider Notes (Signed)
[facility] EMERGENCY DEPARTMENT Provider Note   CSN: [id number] Arrival date & time: 08/21/20  2118     History Chief Complaint  Patient presents with   Generalized Body Aches    Kevin Jefferson is a 55 y.o. male.  HPI  Patient presents with generalized body aches, fever, sore throat x3 days.  He has not tried any alleviating factors, coughing makes it worse.  There is no chest pain or shortness of breath.  Patient is not immunocompromise, he is vaccinated against flu and COVID.  Symptoms have been constant, not worsening or improving.  Past Medical History:  Diagnosis Date   Gout    Hypertension    Stroke [facility])     Patient Active Problem List   Diagnosis Date Noted   CVA (cerebral infarction) 04/03/2013   Chest pain 04/01/2013   Encephalopathy 04/01/2013   Hypertensive urgency 04/01/2013   Right sided weakness 04/01/2013   CKD (chronic kidney disease) stage 3, GFR 30-59 ml/min (HCC) 04/01/2013   Headache(784.0) 04/01/2013    Past Surgical History:  Procedure Laterality Date   ANKLE FRACTURE SURGERY     TEE WITHOUT CARDIOVERSION N/A 04/03/2013   Procedure: TRANSESOPHAGEAL ECHOCARDIOGRAM (TEE);  Surgeon: Lewayne Bunting, MD;  Location: [facility] ENDOSCOPY;  Service: Cardiovascular;  Laterality: N/A;       Family History  Problem Relation Age of Onset   Hypertension Mother    Irregular heart beat Maternal Grandmother    Hypertension Maternal Grandmother     Social History   Tobacco Use   Smoking status: Never   Smokeless tobacco: Never  Substance Use Topics   Alcohol use: No   Drug use: No    Home Medications Prior to Admission medications   Medication Sig Start Date End Date Taking? Authorizing Provider  amLODipine (NORVASC) 10 MG tablet Take 10 mg by mouth daily.    [provider]  colchicine 0.6 MG tablet Take 0.5 tablets (0.3 mg total) by mouth daily. 03/24/18   Howard Pouch, MD  HYDROcodone-acetaminophen (NORCO/VICODIN) 5-325 MG tablet  Take 1-2 tablets by mouth every 6 (six) hours as needed. 06/25/18   Maxwell Caul, PA-C  lisinopril (PRINIVIL,ZESTRIL) 20 MG tablet Take 1 tablet (20 mg total) by mouth daily. 03/24/18 03/24/19  Howard Pouch, MD  simvastatin (ZOCOR) 20 MG tablet Take 1 tablet (20 mg total) by mouth every evening. 04/04/13   Rai, Delene Ruffini, MD    Allergies    Hydralazine hcl  Review of Systems   Review of Systems  Constitutional:  Positive for chills and fever.  HENT:  Positive for sore throat.   Respiratory:  Positive for cough. Negative for shortness of breath.   Cardiovascular:  Negative for chest pain.  Musculoskeletal:  Positive for myalgias.   Physical Exam Updated Vital Signs BP (!) 197/108 (BP Location: Left Arm)   Pulse 78   Temp 98.5 F (36.9 C) (Oral)   Resp 18   Ht 5\' 11"  (1.803 m)   Wt 90.7 kg   SpO2 99%   BMI 27.89 kg/m   Physical Exam Vitals and nursing note reviewed. Exam conducted with a chaperone present.  Constitutional:      General: He is not in acute distress.    Appearance: Normal appearance.  HENT:     Head: Normocephalic and atraumatic.     Nose: Congestion present.     Mouth/Throat:     Pharynx: Posterior oropharyngeal erythema present.  Eyes:     General: No scleral  icterus.    Extraocular Movements: Extraocular movements intact.     Pupils: Pupils are equal, round, and reactive to light.  Cardiovascular:     Rate and Rhythm: Normal rate and regular rhythm.  Pulmonary:     Effort: Pulmonary effort is normal.     Breath sounds: Normal breath sounds.  Skin:    Coloration: Skin is not jaundiced.  Neurological:     Mental Status: He is alert. Mental status is at baseline.     Coordination: Coordination normal.    ED Results / Procedures / Treatments   Labs (all labs ordered are listed, but only abnormal results are displayed) Labs Reviewed  RESP PANEL BY RT-PCR (FLU A&B, COVID) ARPGX2    EKG None  Radiology No results  found.  Procedures Procedures   Medications Ordered in ED Medications - No data to display  ED Course  I have reviewed the triage vital signs and the nursing notes.  Pertinent labs & imaging results that were available during my care of the patient were reviewed by me and considered in my medical decision making (see chart for details).    MDM Rules/Calculators/A&P                           Patient is hypertensive, states has not taken his blood pressure medicine today.  Patient is a [employer], states she often forgets his medicine when he is on trips.  He is not having any chest pain, shortness of breath, headache, dizziness, vision changes.  Low suspicion for hypertensive urgency, suspect this is due to just medical noncompliance.  Do not think this indicates needing further work-up at this time.  Patient is signs of viral URI on physical exam.  He tested Negative for COVID and flu, will give him symptomatic management and discharge home.  His vitals are stable other than being hypertensive, he is able to care for himself and is appropriate for discharge at this time.  He has nontoxic-appearing, clear lung sounds.  Satting at 99% on room air.  Low suspicion for pneumonia, advised to return back to the ED if things worsen.  Final Clinical Impression(s) / ED Diagnoses Final diagnoses:  None    Rx / DC Orders ED Discharge Orders     None        Theron Arista, PA-C 08/21/20 2259    Virgina Norfolk, DO 08/21/20 2351

## 2020-11-21 ENCOUNTER — Encounter (HOSPITAL_BASED_OUTPATIENT_CLINIC_OR_DEPARTMENT_OTHER): Payer: Self-pay | Admitting: *Deleted

## 2020-11-21 ENCOUNTER — Emergency Department (HOSPITAL_BASED_OUTPATIENT_CLINIC_OR_DEPARTMENT_OTHER)
Admission: EM | Admit: 2020-11-21 | Discharge: 2020-11-21 | Disposition: A | Discharge status: Home / Self Care | Payer: BC Managed Care – PPO | Attending: Emergency Medicine | Admitting: Emergency Medicine

## 2020-11-21 ENCOUNTER — Other Ambulatory Visit: Payer: Self-pay

## 2020-11-21 DIAGNOSIS — I129 Hypertensive chronic kidney disease with stage 1 through stage 4 chronic kidney disease, or unspecified chronic kidney disease: Secondary | ICD-10-CM | POA: Insufficient documentation

## 2020-11-21 DIAGNOSIS — N183 Chronic kidney disease, stage 3 unspecified: Secondary | ICD-10-CM | POA: Insufficient documentation

## 2020-11-21 DIAGNOSIS — Z79899 Other long term (current) drug therapy: Secondary | ICD-10-CM | POA: Diagnosis not present

## 2020-11-21 DIAGNOSIS — R21 Rash and other nonspecific skin eruption: Secondary | ICD-10-CM | POA: Diagnosis present

## 2020-11-21 HISTORY — DX: Disorder of kidney and ureter, unspecified: N28.9

## 2020-11-21 MED ORDER — DOXYCYCLINE MONOHYDRATE 100 MG PO CAPS: 100.0000 mg | ORAL_CAPSULE | Freq: Two times a day (BID) | ORAL | 0 refills | Status: AC

## 2020-11-21 NOTE — ED Provider Notes (Signed)
[facility] EMERGENCY DEPARTMENT Provider Note   CSN: [id number] Arrival date & time: 11/21/20  2001     History Chief Complaint  Patient presents with   Rash    Kevin Jefferson is a 55 y.o. male w/ PMHx of HTN, CKD, CVA presents to the ED for scalp rash. This has been worsening/spreading over the last 1-2 weeks. He attempted to get an appointment with his PCP but could not be scheduled until 11/30. He has had trouble with this in the past and was resolved with an antibiotic. He does shave his head but states it is a clean smooth razor. Did have a recent illness with COVID beginning of October. The rash is pruritic, located at the back of scalp and spreading down his neck. It appears pus or fluid filled then wipes off but does not heal.   The history is provided by the patient.  Rash Location:  Head/neck Head/neck rash location:  Scalp Quality: itchiness and redness   Severity:  Severe Onset quality:  Gradual Duration:  2 weeks Timing:  Constant Progression:  Worsening Chronicity:  Recurrent Associated symptoms: URI   Associated symptoms: no abdominal pain, no fever, no headaches, no joint pain, no myalgias, no shortness of breath and no sore throat       Past Medical History:  Diagnosis Date   Gout    Hypertension    Renal disorder    Stroke [facility])     Patient Active Problem List   Diagnosis Date Noted   CVA (cerebral infarction) 04/03/2013   Chest pain 04/01/2013   Encephalopathy 04/01/2013   Hypertensive urgency 04/01/2013   Right sided weakness 04/01/2013   CKD (chronic kidney disease) stage 3, GFR 30-59 ml/min (HCC) 04/01/2013   Headache(784.0) 04/01/2013    Past Surgical History:  Procedure Laterality Date   ANKLE FRACTURE SURGERY     TEE WITHOUT CARDIOVERSION N/A 04/03/2013   Procedure: TRANSESOPHAGEAL ECHOCARDIOGRAM (TEE);  Surgeon: Lewayne Bunting, MD;  Location: [facility] ENDOSCOPY;  Service: Cardiovascular;  Laterality: N/A;       Family History   Problem Relation Age of Onset   Hypertension Mother    Irregular heart beat Maternal Grandmother    Hypertension Maternal Grandmother     Social History   Tobacco Use   Smoking status: Never   Smokeless tobacco: Never  Vaping Use   Vaping Use: Never used  Substance Use Topics   Alcohol use: No   Drug use: No    Home Medications Prior to Admission medications   Medication Sig Start Date End Date Taking? Authorizing Provider  amLODipine (NORVASC) 10 MG tablet Take 10 mg by mouth daily.   Yes [provider]  lisinopril (PRINIVIL,ZESTRIL) 20 MG tablet Take 1 tablet (20 mg total) by mouth daily. 03/24/18 11/21/20 Yes Howard Pouch, MD  colchicine 0.6 MG tablet Take 0.5 tablets (0.3 mg total) by mouth daily. 03/24/18   Howard Pouch, MD  HYDROcodone-acetaminophen (NORCO/VICODIN) 5-325 MG tablet Take 1-2 tablets by mouth every 6 (six) hours as needed. 06/25/18   Maxwell Caul, PA-C  simvastatin (ZOCOR) 20 MG tablet Take 1 tablet (20 mg total) by mouth every evening. 04/04/13   Rai, Delene Ruffini, MD    Allergies    Hydralazine hcl  Review of Systems   Review of Systems  Constitutional:  Positive for activity change. Negative for fever.  HENT:  Negative for congestion, rhinorrhea and sore throat.   Eyes:  Negative for visual disturbance.  Respiratory:  Negative for shortness of breath.   Cardiovascular:  Negative for chest pain.  Gastrointestinal:  Negative for abdominal pain.  Genitourinary:  Negative for difficulty urinating.  Musculoskeletal:  Negative for arthralgias and myalgias.  Skin:  Positive for rash.  Neurological:  Negative for headaches.  Psychiatric/Behavioral:  Positive for sleep disturbance.    Physical Exam Updated Vital Signs BP (!) 204/124 (BP Location: Right Arm)   Pulse 93   Temp 97.7 F (36.5 C) (Oral)   Resp 18   Ht 5\' 11"  (1.803 m)   Wt 90.7 kg   SpO2 98%   BMI 27.89 kg/m   Physical Exam Vitals and nursing note reviewed.   Constitutional:      General: He is not in acute distress.    Appearance: Normal appearance. He is not ill-appearing, toxic-appearing or diaphoretic.  Eyes:     Conjunctiva/sclera: Conjunctivae normal.  Cardiovascular:     Rate and Rhythm: Normal rate and regular rhythm.     Heart sounds: Normal heart sounds.  Pulmonary:     Effort: Pulmonary effort is normal.     Breath sounds: Normal breath sounds.  Skin:    Findings: Rash present.     Comments: Multiple white to erythematous ulcerated/crusted over lesions located over the occipital region of the scalp and posterior neck. Various stages of healing.   Neurological:     Mental Status: He is alert and oriented to person, place, and time.  Psychiatric:        Mood and Affect: Mood normal.        Behavior: Behavior normal.    ED Results / Procedures / Treatments   Labs (all labs ordered are listed, but only abnormal results are displayed) Labs Reviewed - No data to display  EKG None  Radiology No results found.  Procedures Procedures   Medications Ordered in ED Medications - No data to display  ED Course  I have reviewed the triage vital signs and the nursing notes.  Pertinent labs & imaging results that were available during my care of the patient were reviewed by me and considered in my medical decision making (see chart for details).    MDM Rules/Calculators/A&P                          55 yo M presents to ED with non-specific skin eruption worsening for 2 weeks. He does shave his head often and has a history of scalp infection that improved with doxycycline. He was unable to get in to his PCP office and desires relief; this is causing him distress and he is losing sleeping 2/2 pruritis. No other systemic symptoms. Etiology unknown but will treat with 2 week of doxycycline until he can been seen by PCP. Consider fungal component if fails to improve. Consider outpatient dermatology referral for skin biopsy.   Final  Clinical Impression(s) / ED Diagnoses Final diagnoses:  Rash and nonspecific skin eruption    Rx / DC Orders ED Discharge Orders          Ordered    doxycycline (MONODOX) 100 MG capsule  2 times daily        11/21/20 2058             Autry-Lott, Naaman Plummer, DO 11/21/20 2115    Deno Etienne, DO 11/21/20 2119

## 2020-11-21 NOTE — ED Triage Notes (Signed)
Rash on his scalp for a month. He had the same type rash a year ago that was a bacterial injection treated with an antibiotic.

## 2020-11-21 NOTE — ED Notes (Signed)
Patient discharged to home.  All discharge instructions reviewed.  Patient verbalized understanding via teachback method.  VS WDL.  Respirations even and unlabored.  Ambulatory out of ED.   °

## 2021-03-01 ENCOUNTER — Other Ambulatory Visit: Payer: Self-pay

## 2021-03-01 ENCOUNTER — Emergency Department (HOSPITAL_BASED_OUTPATIENT_CLINIC_OR_DEPARTMENT_OTHER): Payer: BC Managed Care – PPO

## 2021-03-01 ENCOUNTER — Encounter (HOSPITAL_BASED_OUTPATIENT_CLINIC_OR_DEPARTMENT_OTHER): Payer: Self-pay | Admitting: Emergency Medicine

## 2021-03-01 ENCOUNTER — Emergency Department (HOSPITAL_BASED_OUTPATIENT_CLINIC_OR_DEPARTMENT_OTHER)
Admission: EM | Admit: 2021-03-01 | Discharge: 2021-03-01 | Disposition: A | Discharge status: Home / Self Care | Payer: BC Managed Care – PPO | Attending: Emergency Medicine | Admitting: Emergency Medicine

## 2021-03-01 ENCOUNTER — Emergency Department (HOSPITAL_COMMUNITY): Payer: BC Managed Care – PPO

## 2021-03-01 DIAGNOSIS — R42 Dizziness and giddiness: Secondary | ICD-10-CM | POA: Diagnosis present

## 2021-03-01 DIAGNOSIS — I10 Essential (primary) hypertension: Secondary | ICD-10-CM | POA: Diagnosis not present

## 2021-03-01 DIAGNOSIS — N1831 Chronic kidney disease, stage 3a: Secondary | ICD-10-CM

## 2021-03-01 DIAGNOSIS — Z79899 Other long term (current) drug therapy: Secondary | ICD-10-CM | POA: Insufficient documentation

## 2021-03-01 DIAGNOSIS — R7989 Other specified abnormal findings of blood chemistry: Secondary | ICD-10-CM | POA: Diagnosis not present

## 2021-03-01 DIAGNOSIS — R110 Nausea: Secondary | ICD-10-CM | POA: Diagnosis not present

## 2021-03-01 LAB — BASIC METABOLIC PANEL
Anion gap: 7 (ref 5–15)
BUN: 26 mg/dL — ABNORMAL HIGH (ref 6–20)
CO2: 23 mmol/L (ref 22–32)
Calcium: 9.6 mg/dL (ref 8.9–10.3)
Chloride: 105 mmol/L (ref 98–111)
Creatinine, Ser: 1.73 mg/dL — ABNORMAL HIGH (ref 0.61–1.24)
GFR, Estimated: 46 mL/min — ABNORMAL LOW (ref >60)
Glucose, Bld: 97 mg/dL (ref 70–99)
Potassium: 4.5 mmol/L (ref 3.5–5.1)
Sodium: 135 mmol/L (ref 135–145)

## 2021-03-01 LAB — URINALYSIS, ROUTINE W REFLEX MICROSCOPIC
Bilirubin Urine: NEGATIVE
Glucose, UA: NEGATIVE mg/dL
Hgb urine dipstick: NEGATIVE
Ketones, ur: NEGATIVE mg/dL
Leukocytes,Ua: NEGATIVE
Nitrite: NEGATIVE
Protein, ur: NEGATIVE mg/dL
Specific Gravity, Urine: 1.02 (ref 1.005–1.030)
pH: 5 (ref 5.0–8.0)

## 2021-03-01 LAB — CBC
HCT: 40.5 % (ref 39.0–52.0)
Hemoglobin: 13.1 g/dL (ref 13.0–17.0)
MCH: 28.2 pg (ref 26.0–34.0)
MCHC: 32.3 g/dL (ref 30.0–36.0)
MCV: 87.1 fL (ref 80.0–100.0)
Platelets: 237 10*3/uL (ref 150–400)
RBC: 4.65 MIL/uL (ref 4.22–5.81)
RDW: 14.3 % (ref 11.5–15.5)
WBC: 5.8 10*3/uL (ref 4.0–10.5)
nRBC: 0 % (ref 0.0–0.2)

## 2021-03-01 LAB — CBG MONITORING, ED: Glucose-Capillary: 100 mg/dL — ABNORMAL HIGH (ref 70–99)

## 2021-03-01 MED ORDER — LORAZEPAM 2 MG/ML IJ SOLN
INTRAMUSCULAR | Status: AC
  Administered 2021-03-01: 1 mg via INTRAVENOUS
  Filled 2021-03-01: qty 1

## 2021-03-01 MED ORDER — LORAZEPAM 2 MG/ML IJ SOLN
1.0000 mg | Freq: Once | INTRAMUSCULAR | Status: AC
  Administered 2021-03-01: 1 mg via INTRAVENOUS
  Filled 2021-03-01: qty 1

## 2021-03-01 MED ORDER — LORAZEPAM 2 MG/ML IJ SOLN: 1.0000 mg | Freq: Once | INTRAMUSCULAR | Status: AC

## 2021-03-01 NOTE — ED Notes (Signed)
PA at bedside.

## 2021-03-01 NOTE — ED Notes (Signed)
ED Provider at bedside. 

## 2021-03-01 NOTE — Discharge Instructions (Addendum)
It was our pleasure to provide your ER care today - we hope that you feel better.  Drink plenty of fluids/stay well hydrated.  From today's labs, your kidney function test/creatinine is mildly elevated compared to prior (Cr 1.73). your blood pressure is also high today.  Continue your blood pressure meds, limit salt intake, avoid taking any nsaid type meds such as ibuprofen/motrin or naprosyn/aleve,  and follow up with primary care doctor in the coming week.  Return to ER if worse, new symptoms, chest pain, trouble breathing, fainting, change in speech or vision, one-side of body numbness/weakness, or other concern.

## 2021-03-01 NOTE — ED Provider Notes (Signed)
[facility] EMERGENCY DEPARTMENT Provider Note   CSN: [id number] Arrival date & time: 03/01/21  0901     History  Chief Complaint  Patient presents with   Dizziness    Phillipe Eberhardt is a 56 y.o. male with past medical history significant for hypertension, hypercholesterolemia, stroke around 6 years ago who presents with concern for sudden onset dizziness, lightheadedness while eating breakfast this morning.  Patient reports that he was sitting drinking his second cup of coffee, waiting for his food when he suddenly had feeling of lightheadedness and dizziness.  He has no chest pain, shortness of breath.  He did endorse some nausea without vomiting.  Denies any constipation, diarrhea.  Reports that he has been in good health recently, no cough, sore throat, fever, chills, dysuria or other issues.  Reports that he did take his blood pressure medication this morning, blood pressure at home in the 130-140 range systolic.  Patient reports that he has had these episodes periodically over the last 2 weeks, usually associated with head turning.  Patient reports that this has been the most severe, first one associated with nausea. Denies pain in ears, headache, vision changes.   Dizziness Associated symptoms: nausea       Home Medications Prior to Admission medications   Medication Sig Start Date End Date Taking? Authorizing Provider  amLODipine (NORVASC) 10 MG tablet Take 10 mg by mouth daily.   Yes [provider]  colchicine 0.6 MG tablet Take 0.5 tablets (0.3 mg total) by mouth daily. 03/24/18   Howard Pouch, MD  HYDROcodone-acetaminophen (NORCO/VICODIN) 5-325 MG tablet Take 1-2 tablets by mouth every 6 (six) hours as needed. 06/25/18   Maxwell Caul, PA-C  lisinopril (PRINIVIL,ZESTRIL) 20 MG tablet Take 1 tablet (20 mg total) by mouth daily. 03/24/18 11/21/20  Howard Pouch, MD  simvastatin (ZOCOR) 20 MG tablet Take 1 tablet (20 mg total) by mouth every evening. 04/04/13    Rai, Delene Ruffini, MD      Allergies    Hydralazine hcl    Review of Systems   Review of Systems  Gastrointestinal:  Positive for nausea.  Neurological:  Positive for dizziness.  All other systems reviewed and are negative.  Physical Exam Updated Vital Signs BP (!) 163/94 (BP Location: Right Arm)    Pulse 78    Temp (!) 97.5 F (36.4 C) (Oral)    Resp 19    Ht 5\' 11"  (1.803 m)    Wt 96.2 kg    SpO2 98%    BMI 29.57 kg/m  Physical Exam Vitals and nursing note reviewed.  Constitutional:      General: He is not in acute distress.    Appearance: Normal appearance.  HENT:     Head: Normocephalic and atraumatic.  Eyes:     General:        Right eye: No discharge.        Left eye: No discharge.     Extraocular Movements: Extraocular movements intact.     Pupils: Pupils are equal, round, and reactive to light.  Cardiovascular:     Rate and Rhythm: Normal rate and regular rhythm.     Heart sounds: No murmur heard.   No friction rub. No gallop.  Pulmonary:     Effort: Pulmonary effort is normal.     Breath sounds: Normal breath sounds.  Abdominal:     General: Bowel sounds are normal.     Palpations: Abdomen is soft.  Skin:  General: Skin is warm and dry.     Capillary Refill: Capillary refill takes less than 2 seconds.  Neurological:     Mental Status: He is alert and oriented to person, place, and time.     Comments: Cranial nerves II through XII grossly intact.  Intact finger-nose, intact heel-to-shin.  Romberg negative, very minimal swaying, but no step out, gait normal.  Alert and oriented x3.  Moves all 4 limbs spontaneously, normal coordination.  No pronator drift.  Intact strength 5 out of 5 bilateral upper and lower extremities.  No corrective saccade with head impulse, no evidence of deficit with skew.  Patient does endorse some ongoing dizziness, which is replicated with rapid decline of HOB, without nystagmus or positive dix-hallpike.    Psychiatric:        Mood and  Affect: Mood normal.        Behavior: Behavior normal.    ED Results / Procedures / Treatments   Labs (all labs ordered are listed, but only abnormal results are displayed) Labs Reviewed  BASIC METABOLIC PANEL - Abnormal; Notable for the following components:      Result Value   BUN 26 (*)    Creatinine, Ser 1.73 (*)    GFR, Estimated 46 (*)    All other components within normal limits  CBG MONITORING, ED - Abnormal; Notable for the following components:   Glucose-Capillary 100 (*)    All other components within normal limits  CBC  URINALYSIS, ROUTINE W REFLEX MICROSCOPIC    EKG EKG Interpretation  Date/Time:  Sunday March 01 2021 09:18:13 EST Ventricular Rate:  74 PR Interval:  175 QRS Duration: 140 QT Interval:  408 QTC Calculation: 453 R Axis:   -47 Text 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 RADIATION DOSE REDUCTION: This exam was performed according to the departmental dose-optimization program which includes automated exposure control, adjustment of the mA and/or kV according to patient size and/or use of iterative reconstruction technique. COMPARISON:  CT head 10/07/2014 FINDINGS: Brain: No acute intracranial hemorrhage, mass effect, or herniation. No extra-axial fluid collections. No evidence of acute territorial infarct. No hydrocephalus. Old lacunar infarct in the left basal ganglia. Vascular: No hyperdense vessel or unexpected calcification. Skull: Normal. Negative for fracture or focal lesion. Sinuses/Orbits: No acute finding. Other: None. IMPRESSION: No acute intracranial process identified. Old lacunar infarct in the left basal  ganglia. Electronically Signed   By: Jannifer Hick M.D.   On: 03/01/2021 10:50    Procedures Procedures    Medications Ordered in ED Medications - No data to display  ED Course/ Medical Decision Making/ A&P                           Medical Decision Making Amount and/or Complexity of Data Reviewed Labs: ordered. Radiology: ordered.   This patient presents to the ED for concern of dizziness, nausea lightheadedness that began suddenly this morning at breakfast, was possibly associated with head turning, this involves an extensive number of treatment options, and is a complaint that carries with it a high risk of complications and morbidity. The emergent differential diagnosis prior to evaluation includes, but is not limited to,  posterior fossa tumor, posterior circulation stroke, BPPV, Mnire's, labyrinthitis, Ramsay Hunt, vertebral artery dissection versus other.  This is not an exhaustive differential.  Past Medical History / Co-morbidities: Hypertension, hypercholesterolemia, history of stroke  Additional history: Additional history obtained from wife. External records from outside source obtained and reviewed including previous emergency department visits, hospitalizations, neurology consults.  Physical Exam: Physical exam performed. The pertinent findings include: Overall nonfocal neurologic exam, I can replicate some of patient's dizziness symptoms with rapid decline of head of bed, but patient without positive Dix-Hallpike, no nystagmus noted.  Unremarkable hints exam.  Lab Tests: I ordered, and personally interpreted labs.  The pertinent results include: Unremarkable CBC, CBG with glucose of 100.  BMP notable for elevated creatinine, BUN.  No recent baseline on file.  Patient has had a history of creatinine that is as or more elevated.  He reports that he is very adequately hydrated, minimal concern for acute dehydration, hypotension, or AKI based on presentation.  Urinalysis  is unremarkable.   Imaging Studies: I ordered imaging studies including head without contrast. I independently visualized and interpreted imaging which showed no acute intracranial abnormality. I agree with the radiologist interpretation.   Cardiac Monitoring:  The patient was maintained on a cardiac monitor.  My attending physician Dr. Rubin Payor viewed and interpreted the cardiac monitored which showed an underlying rhythm of: RBBB, LAFB.   Patient with some notable hypertension. Systolic 163. Elevated creatinine, no recent baseline on file.  Based on patient presentation do not believe that dehydration, hypotension, or AKI or responsible for patient's dizziness at this time.  Overall he is well-appearing, favor peripheral lesion as explanation for his dizziness, however cannot rule out central lesion, with his history of stroke believe that he would benefit from further imaging.  Performed shared decision making with patient he would prefer to perform MRI today rather than follow-up with ENT.  I spoke with Dr. Bernette Mayers excepting physician at [facility] who accepts transfer of patient for MRI to further rule out possible posterior circulation stroke.   Final Clinical Impression(s) / ED Diagnoses Final diagnoses:  None    Rx / DC Orders ED Discharge Orders     None         [place] 03/01/21 1108    Benjiman Core, MD 03/01/21 1531

## 2021-03-01 NOTE — ED Notes (Signed)
Report given to [facility]. 

## 2021-03-01 NOTE — ED Triage Notes (Signed)
Pt arrives from outside [facility] ED location for MRI.

## 2021-03-01 NOTE — ED Triage Notes (Signed)
Pt reports he was sitting in a restaurant this morning, felt dizzy and lightheaded. No hx of diabetes. Denies pain. Also having nausea.

## 2021-03-01 NOTE — ED Provider Notes (Signed)
Care assumed on transfer from [facility] with dizziness and headache, prior history of stroke. Here for MRI. He denies any other complaints.  Physical Exam  BP (!) 148/90 (BP Location: Right Arm)    Pulse 77    Temp 97.9 F (36.6 C) (Oral)    Resp 18    Ht 5\' 11"  (1.803 m)    Wt 96.2 kg    SpO2 98%    BMI 29.57 kg/m   Physical Exam  Procedures  Procedures  ED Course / MDM   Clinical Course as of 03/01/21 1521  Sun Mar 01, 2021  1520 Care of the patient signed out to Dr. Ashok Cordia pending MRI.  [CS]    Clinical Course User Index [CS] Truddie Hidden, MD   Medical Decision Making Amount and/or Complexity of Data Reviewed Labs: ordered. Radiology: ordered.  Risk Prescription drug management.          Truddie Hidden, MD 03/01/21 [phone]

## 2021-03-01 NOTE — ED Notes (Signed)
Pt to MRI

## 2021-03-01 NOTE — ED Notes (Signed)
[facility] Transport Team phoned to request tx to [facility] ED for MRI

## 2021-03-01 NOTE — ED Notes (Signed)
Pt verbalized understanding of d/c instructions, meds and followup care. Denies questions. VSS, no distress noted. Steady gait to exit with all belongings.  ?

## 2021-03-01 NOTE — ED Notes (Signed)
Pt with severe anxiety in MRI, RN brought additional dose IV ativan to MRI and administered to pt.

## 2021-03-01 NOTE — ED Notes (Signed)
Dewaine Oats wife [phone] requesting an update on the patient

## 2021-03-01 NOTE — ED Notes (Signed)
[facility] at bedside 

## 2021-03-01 NOTE — ED Provider Notes (Signed)
Signed out by Dr Bernette Mayers to d/c to home when MRI resulted (if neg for acute stroke).  MR neg for acute cva.   Pt alert, content, no distress.  Pt currently appears stable for d/c per Dr Francena Hanly plan.   Rec pcp f/u.     Cathren Laine, MD 03/01/21 1540

## 2021-06-25 DIAGNOSIS — N179 Acute kidney failure, unspecified: Secondary | ICD-10-CM | POA: Insufficient documentation

## 2021-06-25 DIAGNOSIS — I10 Essential (primary) hypertension: Secondary | ICD-10-CM | POA: Insufficient documentation

## 2022-05-24 ENCOUNTER — Other Ambulatory Visit: Payer: Self-pay

## 2022-05-24 ENCOUNTER — Encounter (HOSPITAL_BASED_OUTPATIENT_CLINIC_OR_DEPARTMENT_OTHER): Payer: Self-pay | Admitting: Urology

## 2022-05-24 ENCOUNTER — Emergency Department (HOSPITAL_BASED_OUTPATIENT_CLINIC_OR_DEPARTMENT_OTHER)
Admission: EM | Admit: 2022-05-24 | Discharge: 2022-05-24 | Disposition: A | Discharge status: Home / Self Care | Payer: BC Managed Care – PPO | Attending: Emergency Medicine | Admitting: Emergency Medicine

## 2022-05-24 DIAGNOSIS — I10 Essential (primary) hypertension: Secondary | ICD-10-CM | POA: Diagnosis not present

## 2022-05-24 DIAGNOSIS — M109 Gout, unspecified: Secondary | ICD-10-CM | POA: Diagnosis not present

## 2022-05-24 DIAGNOSIS — M79672 Pain in left foot: Secondary | ICD-10-CM | POA: Diagnosis present

## 2022-05-24 LAB — URINALYSIS, ROUTINE W REFLEX MICROSCOPIC
Bilirubin Urine: NEGATIVE
Glucose, UA: NEGATIVE mg/dL
Ketones, ur: NEGATIVE mg/dL
Leukocytes,Ua: NEGATIVE
Nitrite: NEGATIVE
Protein, ur: >=300 mg/dL — AB
Specific Gravity, Urine: 1.025 (ref 1.005–1.030)
pH: 7 (ref 5.0–8.0)

## 2022-05-24 LAB — CBC WITH DIFFERENTIAL/PLATELET
Abs Immature Granulocytes: 0.05 10*3/uL (ref 0.00–0.07)
Basophils Absolute: 0 10*3/uL (ref 0.0–0.1)
Basophils Relative: 0 %
Eosinophils Absolute: 0.1 10*3/uL (ref 0.0–0.5)
Eosinophils Relative: 1 %
HCT: 40.7 % (ref 39.0–52.0)
Hemoglobin: 13.4 g/dL (ref 13.0–17.0)
Immature Granulocytes: 1 %
Lymphocytes Relative: 17 %
Lymphs Abs: 1.9 10*3/uL (ref 0.7–4.0)
MCH: 28.3 pg (ref 26.0–34.0)
MCHC: 32.9 g/dL (ref 30.0–36.0)
MCV: 85.9 fL (ref 80.0–100.0)
Monocytes Absolute: 0.8 10*3/uL (ref 0.1–1.0)
Monocytes Relative: 8 %
Neutro Abs: 8 10*3/uL — ABNORMAL HIGH (ref 1.7–7.7)
Neutrophils Relative %: 73 %
Platelets: 173 10*3/uL (ref 150–400)
RBC: 4.74 MIL/uL (ref 4.22–5.81)
RDW: 14.2 % (ref 11.5–15.5)
WBC: 10.9 10*3/uL — ABNORMAL HIGH (ref 4.0–10.5)
nRBC: 0 % (ref 0.0–0.2)

## 2022-05-24 LAB — URINALYSIS, MICROSCOPIC (REFLEX)
RBC / HPF: NONE SEEN RBC/hpf (ref 0–5)
Squamous Epithelial / HPF: NONE SEEN /HPF (ref 0–5)

## 2022-05-24 LAB — BASIC METABOLIC PANEL
Anion gap: 12 (ref 5–15)
BUN: 26 mg/dL — ABNORMAL HIGH (ref 6–20)
CO2: 24 mmol/L (ref 22–32)
Calcium: 9.3 mg/dL (ref 8.9–10.3)
Chloride: 100 mmol/L (ref 98–111)
Creatinine, Ser: 2.03 mg/dL — ABNORMAL HIGH (ref 0.61–1.24)
GFR, Estimated: 38 mL/min — ABNORMAL LOW (ref >60)
Glucose, Bld: 96 mg/dL (ref 70–99)
Potassium: 3.8 mmol/L (ref 3.5–5.1)
Sodium: 136 mmol/L (ref 135–145)

## 2022-05-24 MED ORDER — AMLODIPINE BESYLATE 10 MG PO TABS: 10.0000 mg | ORAL_TABLET | Freq: Every day | ORAL | 2 refills | Status: AC

## 2022-05-24 MED ORDER — METHYLPREDNISOLONE 4 MG PO TBPK: ORAL_TABLET | ORAL | 0 refills | Status: DC

## 2022-05-24 MED ORDER — LISINOPRIL 20 MG PO TABS: 20.0000 mg | ORAL_TABLET | Freq: Every day | ORAL | 2 refills | Status: AC

## 2022-05-24 MED ORDER — OXYCODONE-ACETAMINOPHEN 5-325 MG PO TABS
1.0000 | ORAL_TABLET | Freq: Once | ORAL | Status: AC
  Administered 2022-05-24: 1 via ORAL
  Filled 2022-05-24: qty 1

## 2022-05-24 MED ORDER — METOPROLOL SUCCINATE ER 50 MG PO TB24: 50.0000 mg | ORAL_TABLET | Freq: Every day | ORAL | 2 refills | Status: AC

## 2022-05-24 MED ORDER — LABETALOL HCL 5 MG/ML IV SOLN
20.0000 mg | Freq: Once | INTRAVENOUS | Status: AC
  Administered 2022-05-24: 20 mg via INTRAVENOUS
  Filled 2022-05-24: qty 4

## 2022-05-24 NOTE — ED Notes (Signed)
ED Provider at bedside. 

## 2022-05-24 NOTE — Discharge Instructions (Addendum)
It was a pleasure taking care of you today!  You will be sent a prescription for Medrol dosepak (steroids), take as prescribed.  Call your nephrologist today to set up a follow-up appointment regarding today's ED visit.  You will also be sent refills of your blood pressure medication, take as directed.  Ensure that you are checking your blood pressure at home.  You may follow up with your primary care provider as needed. Ensure to maintain fluid intake. Return to the emergency department if you are experiencing increasing/worsening chest pain, trouble breathing, dizziness, headache, vision changes, or symptoms.

## 2022-05-24 NOTE — ED Triage Notes (Signed)
Pt states left foot pain that started yesterday, unable to put shoe on this am due to swelling, states pain worse in big toe  No known injury   H/o gout Pt htn in triage, states ran out of BP meds 3 days ago and has new rx at pharmacy waiting to be picked up   Takes norvasc and lisinipril

## 2022-05-24 NOTE — ED Provider Notes (Signed)
[place] [facility] AT [facility] Provider Note   CSN: [id number] Arrival date & time: 05/24/22  4098     History  Chief Complaint  Patient presents with   Foot Pain    Kevin Jefferson is a 57 y.o. male who presents to the ED with concerns for left foot pain onset yesterday. No known injury. Notes when he has his gout flares it typically is in his great toe. Denies recent use of NSAIDs, colchicine, allopurinol, steroids for his gout. Endorses recent consumption of red meat and shellfish. Denies recent beer use. Has been out of his antihypertensives x 7 days. Denies chest pain, shortness of breath, headache, blurred vision, diplopia, abdominal pain, nausea, vomiting, urinary symptoms, numbness, tingling, weakness, dizziness.    Per pt chart review: Pt called his nephrology office on 04/30/22 for refill of his antihypertensives. At that time, nephrology recommended patient follow up with his PCP for medication refill. Review of chart notes that patient had an appointment with his nephrologist on 06/25/2021 and was prescribed metroprolol 50 mg.   The history is provided by the patient. No language interpreter was used.       Home Medications Prior to Admission medications   Medication Sig Start Date End Date Taking? Authorizing Provider  methylPREDNISolone (MEDROL DOSEPAK) 4 MG TBPK tablet Take as directed 05/24/22  Yes Crispin Vogel A, PA-C  metoprolol succinate (TOPROL-XL) 50 MG 24 hr tablet Take 1 tablet (50 mg total) by mouth daily. 05/24/22  Yes Andriana Casa A, PA-C  amLODipine (NORVASC) 10 MG tablet Take 1 tablet (10 mg total) by mouth daily. 05/24/22 06/23/22  Jisela Merlino A, PA-C  colchicine 0.6 MG tablet Take 0.5 tablets (0.3 mg total) by mouth daily. 03/24/18   Howard Pouch, MD  HYDROcodone-acetaminophen (NORCO/VICODIN) 5-325 MG tablet Take 1-2 tablets by mouth every 6 (six) hours as needed. 06/25/18   Maxwell Caul, PA-C  lisinopril (ZESTRIL) 20 MG tablet  Take 1 tablet (20 mg total) by mouth daily. 05/24/22 06/23/22  Mahaley Schwering A, PA-C  simvastatin (ZOCOR) 20 MG tablet Take 1 tablet (20 mg total) by mouth every evening. 04/04/13   Rai, Delene Ruffini, MD      Allergies    Hydralazine hcl    Review of Systems   Review of Systems  All other systems reviewed and are negative.   Physical Exam Updated Vital Signs BP (!) 258/154 (BP Location: Right Arm)   Pulse 100   Temp 98.2 F (36.8 C) (Oral)   Resp 20   Ht 5\' 11"  (1.803 m)   Wt 90.7 kg   SpO2 97%   BMI 27.89 kg/m  Physical Exam Vitals and nursing note reviewed.  Constitutional:      General: He is not in acute distress.    Appearance: Normal appearance.  Eyes:     General: No scleral icterus.    Extraocular Movements: Extraocular movements intact.  Cardiovascular:     Rate and Rhythm: Normal rate.  Pulmonary:     Effort: Pulmonary effort is normal. No respiratory distress.  Abdominal:     Palpations: Abdomen is soft. There is no mass.     Tenderness: There is no abdominal tenderness.  Musculoskeletal:        General: Normal range of motion.     Cervical back: Neck supple.     Comments: Pedal pulse intact. TTP noted to left great toe without appreciable erythema or swelling noted. Able to flex and extend against resistance. Ambulates  with an antalgic gait.   Skin:    General: Skin is warm and dry.     Findings: No rash.  Neurological:     Mental Status: He is alert.     Sensory: Sensation is intact.     Motor: Motor function is intact.  Psychiatric:        Behavior: Behavior normal.     ED Results / Procedures / Treatments   Labs (all labs ordered are listed, but only abnormal results are displayed) Labs Reviewed  BASIC METABOLIC PANEL - Abnormal; Notable for the following components:      Result Value   BUN 26 (*)    Creatinine, Ser 2.03 (*)    GFR, Estimated 38 (*)    All other components within normal limits  CBC WITH DIFFERENTIAL/PLATELET - Abnormal;  Notable for the following components:   WBC 10.9 (*)    Neutro Abs 8.0 (*)    All other components within normal limits  URINALYSIS, ROUTINE W REFLEX MICROSCOPIC - Abnormal; Notable for the following components:   Hgb urine dipstick TRACE (*)    Protein, ur >=300 (*)    All other components within normal limits  URINALYSIS, MICROSCOPIC (REFLEX) - Abnormal; Notable for the following components:   Bacteria, UA RARE (*)    All other components within normal limits    EKG None  Radiology No results found.  Procedures Procedures    Medications Ordered in ED Medications  labetalol (NORMODYNE) injection 20 mg (20 mg Intravenous Given 05/24/22 0942)  oxyCODONE-acetaminophen (PERCOCET/ROXICET) 5-325 MG per tablet 1 tablet (1 tablet Oral Given 05/24/22 1013)    ED Course/ Medical Decision Making/ A&P Clinical Course as of 05/24/22 1742  Mon May 24, 2022  1119 Pt re-evaluated and resting comfortably on stretcher.  Patient notes improvement of his symptoms with treatment regimen in the ED.  In-depth conversation held with patient regarding his blood pressure and need for him to follow-up with his primary care provider as well as his nephrologist.  Patient agreeable at this time.  Discussed with patient discharge treatment plan.  Answered all available questions.  Patient appears safe to discharge this time. [SB]    Clinical Course User Index [SB] Khaleb Broz A, PA-C                             Medical Decision Making Amount and/or Complexity of Data Reviewed Labs: ordered.  Risk Prescription drug management.   Pt presents with concerns for left foot pain onset yesterday.  Has a history of gout.  Does not take any medications for his gout.  Patient afebrile.  Denies recent fall, injury, trauma.  On exam patient with tenderness to palpation noted to left great toe without appreciable erythema or swelling noted.  Able to flex and extend against resistance.  Ambulates with antalgic  gait. Differential diagnosis includes gout, septic arthritis, osteoarthritis, cellulitis.    Co morbidities that complicate the patient evaluation: Gout HTN  Additional history obtained:  External records from outside source obtained and reviewed including: Pt called his nephrology office on 04/30/22 for refill of his antihypertensives. At that time, nephrology recommended patient follow up with his PCP for medication refill.   Labs:  I ordered, and personally interpreted labs.  The pertinent results include:   CBC with mildly elevated leukocytosis at 7.9 otherwise unremarkable (leukocytosis likely in the setting of pain) BMP with BUN elevated at 26, creatinine elevated 2.03,  GFR decreased at 38 Urinalysis trace hemoglobin otherwise unremarkable  Medications:  I ordered medication including labetalol and Percocet for symptom management and blood pressure treatment Reevaluation of the patient after these medicines and interventions, I reevaluated the patient and found that they have improved I have reviewed the patients home medicines and have made adjustments as needed   Disposition: Presented suspicious for likely gout exacerbation.  Doubt concerns at this time for septic arthritis, no increased warmth or erythema noted to the joint.  Doubt concerns at this time for osteoarthritis or cellulitis.  After consideration of the diagnostic results and the patients response to treatment, I feel that the patient would benefit from Discharge home.  Discussed with patient in depth conversation regarding uncontrolled hypertension today.  Patient instructed to follow-up with primary care provider regarding today's ED visit.  Home antihypertensives refilled today consisted of Norvasc, lisinopril, metoprolol.  Patient sent with a prescription for Medrol Dosepak.  Instructed to follow-up with nephrology regarding today's ED visit as well. Supportive care measures and strict return precautions discussed with  patient at bedside. Pt acknowledges and verbalizes understanding. Pt appears safe for discharge. Follow up as indicated in discharge paperwork.    This chart was dictated using voice recognition software, Dragon. Despite the best efforts of this provider to proofread and correct errors, errors may still occur which can change documentation meaning.   Final Clinical Impression(s) / ED Diagnoses Final diagnoses:  Uncontrolled hypertension  Acute gout involving toe of left foot, unspecified cause    Rx / DC Orders ED Discharge Orders          Ordered    methylPREDNISolone (MEDROL DOSEPAK) 4 MG TBPK tablet        05/24/22 1146    amLODipine (NORVASC) 10 MG tablet  Daily        05/24/22 1150    lisinopril (ZESTRIL) 20 MG tablet  Daily        05/24/22 1150    metoprolol succinate (TOPROL-XL) 50 MG 24 hr tablet  Daily        05/24/22 1150              Armani Brar A, PA-C 05/24/22 1742    Melene Plan, DO 05/25/22 0830

## 2022-09-03 ENCOUNTER — Emergency Department (HOSPITAL_BASED_OUTPATIENT_CLINIC_OR_DEPARTMENT_OTHER)
Admission: EM | Admit: 2022-09-03 | Discharge: 2022-09-03 | Disposition: A | Discharge status: Home / Self Care | Payer: BC Managed Care – PPO | Attending: Emergency Medicine | Admitting: Emergency Medicine

## 2022-09-03 ENCOUNTER — Other Ambulatory Visit: Payer: Self-pay

## 2022-09-03 ENCOUNTER — Emergency Department (HOSPITAL_BASED_OUTPATIENT_CLINIC_OR_DEPARTMENT_OTHER): Payer: BC Managed Care – PPO

## 2022-09-03 DIAGNOSIS — Y9389 Activity, other specified: Secondary | ICD-10-CM | POA: Insufficient documentation

## 2022-09-03 DIAGNOSIS — W228XXA Striking against or struck by other objects, initial encounter: Secondary | ICD-10-CM | POA: Diagnosis not present

## 2022-09-03 DIAGNOSIS — Z79899 Other long term (current) drug therapy: Secondary | ICD-10-CM | POA: Insufficient documentation

## 2022-09-03 DIAGNOSIS — N189 Chronic kidney disease, unspecified: Secondary | ICD-10-CM | POA: Insufficient documentation

## 2022-09-03 DIAGNOSIS — I129 Hypertensive chronic kidney disease with stage 1 through stage 4 chronic kidney disease, or unspecified chronic kidney disease: Secondary | ICD-10-CM | POA: Insufficient documentation

## 2022-09-03 DIAGNOSIS — M25572 Pain in left ankle and joints of left foot: Secondary | ICD-10-CM | POA: Insufficient documentation

## 2022-09-03 MED ORDER — AMLODIPINE BESYLATE 5 MG PO TABS
10.0000 mg | ORAL_TABLET | Freq: Once | ORAL | Status: AC
  Administered 2022-09-03: 10 mg via ORAL
  Filled 2022-09-03: qty 2

## 2022-09-03 MED ORDER — LISINOPRIL 10 MG PO TABS
40.0000 mg | ORAL_TABLET | Freq: Once | ORAL | Status: AC
  Administered 2022-09-03: 40 mg via ORAL
  Filled 2022-09-03: qty 4

## 2022-09-03 MED ORDER — HYDROCODONE-ACETAMINOPHEN 5-325 MG PO TABS
1.0000 | ORAL_TABLET | Freq: Once | ORAL | Status: AC
  Administered 2022-09-03: 1 via ORAL
  Filled 2022-09-03: qty 1

## 2022-09-03 MED ORDER — NAPROXEN 500 MG PO TABS: 500.0000 mg | ORAL_TABLET | Freq: Two times a day (BID) | ORAL | 0 refills | Status: AC | PRN

## 2022-09-03 NOTE — ED Provider Notes (Signed)
[place] [facility] AT [facility] Provider Note   CSN: [id number] Arrival date & time: 09/03/22  1823     History  Chief Complaint  Patient presents with   Ankle Pain    Kevin Jefferson is a 57 y.o. male with a history of stroke, hypertension, and chronic kidney disease who presents to the ED today for left ankle pain.  Patient reports that about 4 days ago he was helping his son move and was carrying a dresser when it hit his inner left ankle and pinned it against a wall.  He reports some pain and swelling to the left ankle and foot.  He fell asleep soaking his foot and ultimately left it submerged in water for an hour and a half. He reports increased pain to the foot after that and not being able to stand on it at that point. Patient has not tried oral medications for pain. No weakness, numbness, or loss of sensation. Denies any other complaints or concerns at this time.    Home Medications Prior to Admission medications   Medication Sig Start Date End Date Taking? Authorizing Provider  naproxen (NAPROSYN) 500 MG tablet Take 1 tablet (500 mg total) by mouth 2 (two) times daily as needed for up to 7 days. 09/03/22 09/10/22 Yes Maxwell Marion, PA-C  amLODipine (NORVASC) 10 MG tablet Take 1 tablet (10 mg total) by mouth daily. 05/24/22 06/23/22  Blue, Soijett A, PA-C  colchicine 0.6 MG tablet Take 0.5 tablets (0.3 mg total) by mouth daily. 03/24/18   Howard Pouch, MD  HYDROcodone-acetaminophen (NORCO/VICODIN) 5-325 MG tablet Take 1-2 tablets by mouth every 6 (six) hours as needed. 06/25/18   Maxwell Caul, PA-C  lisinopril (ZESTRIL) 20 MG tablet Take 1 tablet (20 mg total) by mouth daily. 05/24/22 06/23/22  Blue, Soijett A, PA-C  methylPREDNISolone (MEDROL DOSEPAK) 4 MG TBPK tablet Take as directed 05/24/22   Blue, Soijett A, PA-C  metoprolol succinate (TOPROL-XL) 50 MG 24 hr tablet Take 1 tablet (50 mg total) by mouth daily. 05/24/22   Blue, Soijett A, PA-C  simvastatin  (ZOCOR) 20 MG tablet Take 1 tablet (20 mg total) by mouth every evening. 04/04/13   Rai, Ripudeep K, MD      Allergies    Hydralazine hcl    Review of Systems   Review of Systems  Musculoskeletal:        Left ankle pain  All other systems reviewed and are negative.   Physical Exam Updated Vital Signs BP (!) 209/115   Pulse 76   Temp 98.9 F (37.2 C) (Oral)   Resp 16   Ht 5\' 11"  (1.803 m)   Wt 98 kg   SpO2 97%   BMI 30.13 kg/m  Physical Exam Vitals and nursing note reviewed.  Constitutional:      Appearance: Normal appearance.  HENT:     Head: Normocephalic and atraumatic.     Mouth/Throat:     Mouth: Mucous membranes are moist.  Eyes:     Conjunctiva/sclera: Conjunctivae normal.     Pupils: Pupils are equal, round, and reactive to light.  Cardiovascular:     Rate and Rhythm: Normal rate and regular rhythm.     Pulses: Normal pulses.     Heart sounds: Normal heart sounds.  Pulmonary:     Effort: Pulmonary effort is normal.     Breath sounds: Normal breath sounds.  Abdominal:     Palpations: Abdomen is soft.     Tenderness:  There is no abdominal tenderness.  Musculoskeletal:        General: Swelling and tenderness present.     Comments: Tenderness and swelling present at the medial and lateral malleoli of the left ankle as well as the dorsum of the left foot. Strength and sensation intact.  Skin:    General: Skin is warm and dry.     Findings: No rash.  Neurological:     General: No focal deficit present.     Mental Status: He is alert.     Sensory: No sensory deficit.     Motor: No weakness.  Psychiatric:        Mood and Affect: Mood normal.        Behavior: Behavior normal.     ED Results / Procedures / Treatments   Labs (all labs ordered are listed, but only abnormal results are displayed) Labs Reviewed - No data to display  EKG None  Radiology DG Foot 2 Views Left  Result Date: 09/03/2022 CLINICAL DATA:  Left foot pain following heavy  lifting, initial encounter EXAM: LEFT FOOT - 2 VIEW COMPARISON:  None Available. FINDINGS: Mild hallux valgus deformity is noted. No acute fracture or dislocation is noted. Mild soft tissue swelling is seen. IMPRESSION: Soft tissue swelling without acute bony abnormality. Electronically Signed   By: Alcide Clever M.D.   On: 09/03/2022 19:43   DG Ankle Complete Left  Result Date: 09/03/2022 CLINICAL DATA:  Hurt ankle while moving furniture, initial encounter EXAM: LEFT ANKLE COMPLETE - 3+ VIEW COMPARISON:  06/25/2018 FINDINGS: Postsurgical changes are noted in the distal fibula. Increased sclerosis along the articulation of the distal fibula and tibia is noted likely related to partial fusion. No acute fracture or dislocation is seen. Mild soft tissue swelling is noted. IMPRESSION: Mild soft tissue swelling.  No acute bony abnormality is seen. Electronically Signed   By: Alcide Clever M.D.   On: 09/03/2022 19:42    Procedures Procedures: not indicated.   Medications Ordered in ED Medications  HYDROcodone-acetaminophen (NORCO/VICODIN) 5-325 MG per tablet 1 tablet (1 tablet Oral Given 09/03/22 1905)  lisinopril (ZESTRIL) tablet 40 mg (40 mg Oral Given 09/03/22 2016)  amLODipine (NORVASC) tablet 10 mg (10 mg Oral Given 09/03/22 2016)    ED Course/ Medical Decision Making/ A&P                                 Medical Decision Making Amount and/or Complexity of Data Reviewed Radiology: ordered.  Risk Prescription drug management.   This patient presents to the ED for concern of left ankle pain, this involves an extensive number of treatment options, and is a complaint that carries with it a high risk of complications and morbidity.   Differential diagnosis includes: fracture, dislocation, muscle sprain, contusion, etc.   Comorbidities  See HPI above   Additional History  Additional history obtained from previous ED records.   Imaging Studies  I ordered imaging studies including left  foot and ankle x-rays  I independently visualized and interpreted imaging which showed: mild soft tissue swelling without acute bony abnormality. I agree with the radiologist interpretation   Problem List / ED Course / Critical Interventions / Medication Management  Left ankle pain I ordered medications including: Norco for pain  Reevaluation of the patient after these medicines showed that the patient improved. Repeat BP of 224/128. I ordered patient's night time medications of Lisinopril and Amlodipine.  Denies headache, chest pain, or vision changes. BP dropped to 209/115 45 minutes after BP meds given. With shared decision making, patient decided that he did not wish to stay for further work up for hypertensive urgency but instead would like to follow up with his cardiologist. He tracks his BP readings at home and runs in the 170s systolically on average. I have reviewed the patients home medicines and have made adjustments as needed   Social Determinants of Health  Access to healthcare   Test / Admission - Considered  Reviewed results with patient. He is stable and safe for discharge home. Return precautions provided.        Final Clinical Impression(s) / ED Diagnoses Final diagnoses:  Acute left ankle pain    Rx / DC Orders ED Discharge Orders          Ordered    naproxen (NAPROSYN) 500 MG tablet  2 times daily PRN        09/03/22 2003              Maxwell Marion, PA-C 09/03/22 2130    Glyn Ade, MD 09/06/22 [phone]

## 2022-09-03 NOTE — ED Notes (Signed)
Appropriate sized crutches already at home, did not need them today.

## 2022-09-03 NOTE — ED Triage Notes (Signed)
Pt POV in wheelchair- c/o L ankle pain starting last night. Was carrying dresser 4 days ago, may have injured it at that time.   Reports tingling in left ankle.   Pt reports not having taken BP meds today. Denies CP, ShOB.

## 2022-09-03 NOTE — Discharge Instructions (Addendum)
As discussed, your imaging showed swelling of your foot and ankle but no fractures. Please utilize RICE therapy to help with swelling and pain. I have sent Naproxen to your pharmacy. Take this medication twice a day as needed for ankle and foot pain and swelling. Utilize crutches until you can be evaluated by orthopedics.   I have provided the phone number for ortho. Give them a call Monday to make an appointment for reevaluation.  Additionally, please track your BP and take your medications as prescribed. Call your cardiologist as soon as you can and let them know of your elevated readings.  Contact a health care provider if: Your pain gets worse. Your pain does not get better with medicine. You have a fever or chills. You have more trouble walking. You have new symptoms. Your foot, leg, toes, or ankle tingles, becomes numb or swollen, or turns cold and blue.

## 2022-09-07 DIAGNOSIS — M109 Gout, unspecified: Secondary | ICD-10-CM | POA: Insufficient documentation

## 2022-09-07 DIAGNOSIS — S9002XA Contusion of left ankle, initial encounter: Secondary | ICD-10-CM | POA: Insufficient documentation

## 2022-10-03 ENCOUNTER — Encounter (HOSPITAL_BASED_OUTPATIENT_CLINIC_OR_DEPARTMENT_OTHER): Payer: Self-pay

## 2022-10-03 ENCOUNTER — Other Ambulatory Visit: Payer: Self-pay

## 2022-10-03 ENCOUNTER — Emergency Department (HOSPITAL_BASED_OUTPATIENT_CLINIC_OR_DEPARTMENT_OTHER)
Admission: EM | Admit: 2022-10-03 | Discharge: 2022-10-03 | Disposition: A | Discharge status: Home / Self Care | Payer: BC Managed Care – PPO | Attending: Emergency Medicine | Admitting: Emergency Medicine

## 2022-10-03 DIAGNOSIS — Z79899 Other long term (current) drug therapy: Secondary | ICD-10-CM | POA: Diagnosis not present

## 2022-10-03 DIAGNOSIS — U071 COVID-19: Secondary | ICD-10-CM | POA: Diagnosis not present

## 2022-10-03 DIAGNOSIS — R059 Cough, unspecified: Secondary | ICD-10-CM | POA: Diagnosis present

## 2022-10-03 LAB — SARS CORONAVIRUS 2 BY RT PCR: SARS Coronavirus 2 by RT PCR: POSITIVE — AB

## 2022-10-03 MED ORDER — MOLNUPIRAVIR 200 MG PO CAPS: 4.0000 | ORAL_CAPSULE | Freq: Two times a day (BID) | ORAL | 0 refills | Status: AC

## 2022-10-03 NOTE — ED Provider Notes (Signed)
[place] [facility] Provider Note   CSN: [id number] Arrival date & time: 10/03/22  1500     History  Chief Complaint  Patient presents with   Sore Throat   Cough    Kevin Jefferson is a 57 y.o. male who presents emergency department with a chief complaint of flulike symptoms.  Onset yesterday at 10 AM.  He has associated sore throat, headache, cough, body aches, chills.  He states he was at [employer] and there were a lot of people who are coughing around him.  He has no shortness of breath.  He has subjective fevers.  He has associated comorbidities of high blood pressure.  And high cholesterol   Sore Throat  Cough      Home Medications Prior to Admission medications   Medication Sig Start Date End Date Taking? Authorizing Provider  amLODipine (NORVASC) 10 MG tablet Take 1 tablet (10 mg total) by mouth daily. 05/24/22 06/23/22  Blue, Soijett A, PA-C  colchicine 0.6 MG tablet Take 0.5 tablets (0.3 mg total) by mouth daily. 03/24/18   Howard Pouch, MD  HYDROcodone-acetaminophen (NORCO/VICODIN) 5-325 MG tablet Take 1-2 tablets by mouth every 6 (six) hours as needed. 06/25/18   Maxwell Caul, PA-C  lisinopril (ZESTRIL) 20 MG tablet Take 1 tablet (20 mg total) by mouth daily. 05/24/22 06/23/22  Blue, Soijett A, PA-C  methylPREDNISolone (MEDROL DOSEPAK) 4 MG TBPK tablet Take as directed 05/24/22   Blue, Soijett A, PA-C  metoprolol succinate (TOPROL-XL) 50 MG 24 hr tablet Take 1 tablet (50 mg total) by mouth daily. 05/24/22   Blue, Soijett A, PA-C  simvastatin (ZOCOR) 20 MG tablet Take 1 tablet (20 mg total) by mouth every evening. 04/04/13   Rai, Ripudeep K, MD      Allergies    Hydralazine hcl    Review of Systems   Review of Systems  Respiratory:  Positive for cough.     Physical Exam Updated Vital Signs BP (!) 182/113   Pulse 99   Temp 98.5 F (36.9 C)   Resp 18   Ht 5\' 11"  (1.803 m)   Wt 98 kg   SpO2 100%   BMI 30.13 kg/m   Physical Exam Vitals and nursing note reviewed.  Constitutional:      General: He is not in acute distress.    Appearance: He is well-developed. He is ill-appearing. He is not toxic-appearing or diaphoretic.  HENT:     Head: Normocephalic and atraumatic.  Eyes:     General: No scleral icterus.    Conjunctiva/sclera: Conjunctivae normal.  Cardiovascular:     Rate and Rhythm: Normal rate and regular rhythm.     Heart sounds: Normal heart sounds.  Pulmonary:     Effort: Pulmonary effort is normal. No respiratory distress.     Breath sounds: Normal breath sounds.  Abdominal:     Palpations: Abdomen is soft.     Tenderness: There is no abdominal tenderness.  Musculoskeletal:     Cervical back: Normal range of motion and neck supple.  Skin:    General: Skin is warm and dry.  Neurological:     Mental Status: He is alert.  Psychiatric:        Behavior: Behavior normal.     ED Results / Procedures / Treatments   Labs (all labs ordered are listed, but only abnormal results are displayed) Labs Reviewed  SARS CORONAVIRUS 2 BY RT PCR - Abnormal; Notable for the following  components:      Result Value   SARS Coronavirus 2 by RT PCR POSITIVE (*)    All other components within normal limits    EKG None  Radiology No results found.  Procedures Procedures    Medications Ordered in ED Medications - No data to display  ED Course/ Medical Decision Making/ A&P Clinical Course as of 10/03/22 1612  Sun Oct 03, 2022  1611 SARS Coronavirus 2 by RT PCR (hospital order, performed in [facility] hospital lab) *cepheid single result test* Anterior Nasal Swab(!) [AH]    Clinical Course User Index [AH] Arthor Captain, PA-C                                 Medical Decision Making  57 year old male who presents with flulike symptoms.  He is notably hypertensive.  He has a history of chronic hypertension.  Patient is positive for coronavirus.  He is hemodynamically stable without  active fever wheezing or shortness of breath.  Due to comorbidities were discharged with molnupiravir, discussed over-the-counter medications and return precautions.  Appropriate for discharge at this time        Final Clinical Impression(s) / ED Diagnoses Final diagnoses:  None    Rx / DC Orders ED Discharge Orders     None         Arthor Captain, PA-C 10/03/22 1617    Glyn Ade, MD 10/03/22 2251

## 2022-10-03 NOTE — ED Triage Notes (Signed)
The patient has been having cough, sore throat, congestion and body aches since yesterday.

## 2022-11-02 IMAGING — CT CT HEAD W/O CM
4 series · 16 of 47 positions shown, 18 images · non-contrast
Comparison: CT head 10/07/2014

CLINICAL DATA: Neuro deficit



[Series 2: head wo · axial · 0.47mm/px · z∈[-318,-198]mm · 7 of 32 slices shown, 9 images]
[im 4/32  brain]
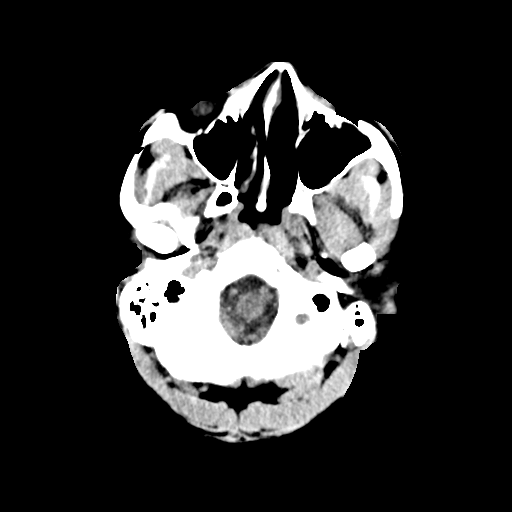
[im 4/32  bone]
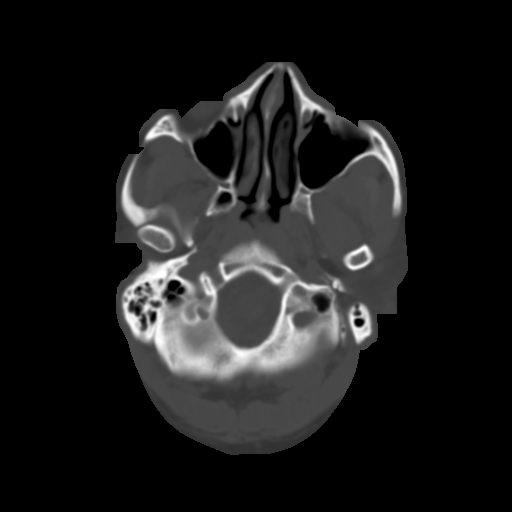
[im 8/32  brain]
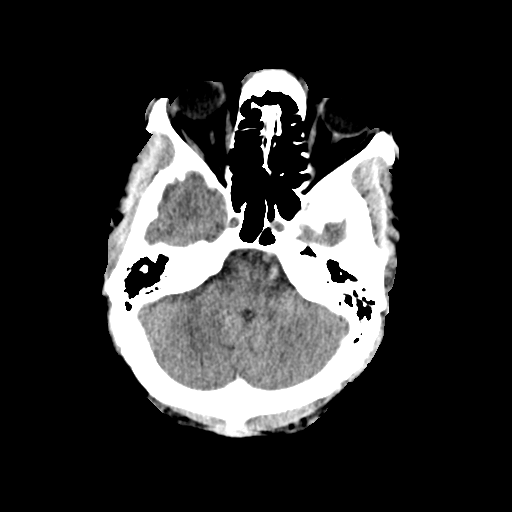
[im 12/32  brain]
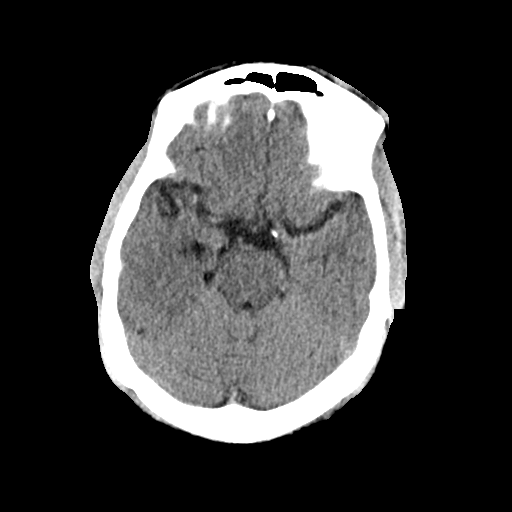
[im 16/32  brain]
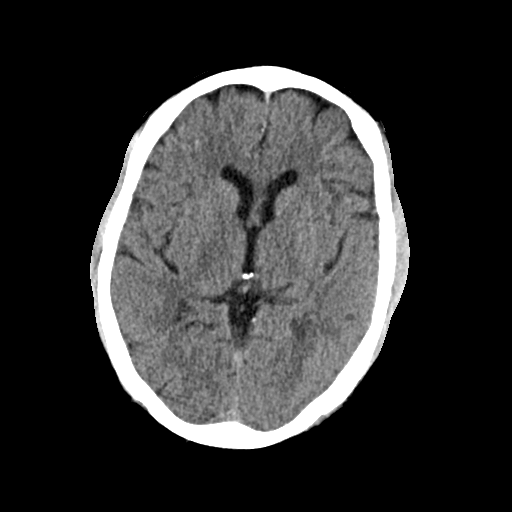
[im 20/32  brain]
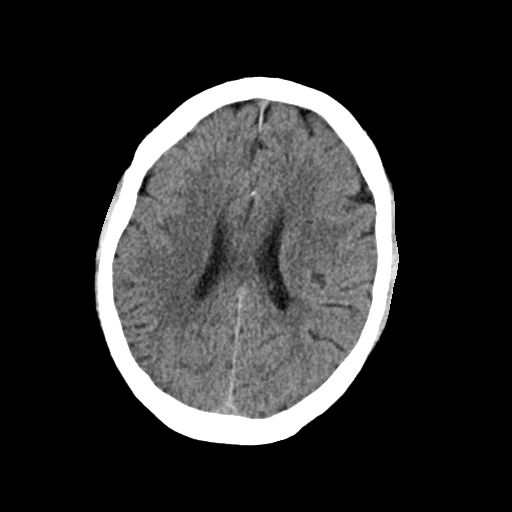
[im 20/32  bone]
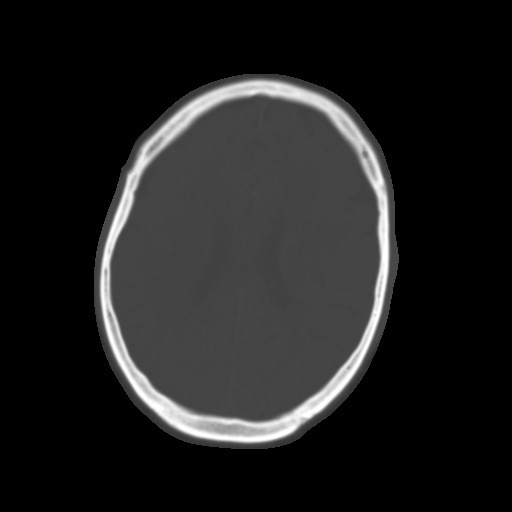
[im 24/32  brain]
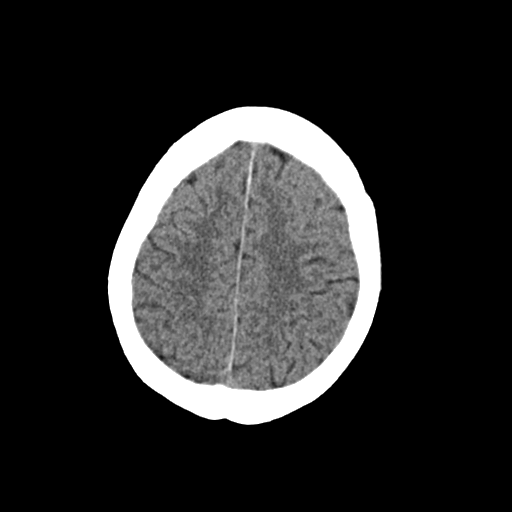
[im 28/32  brain]
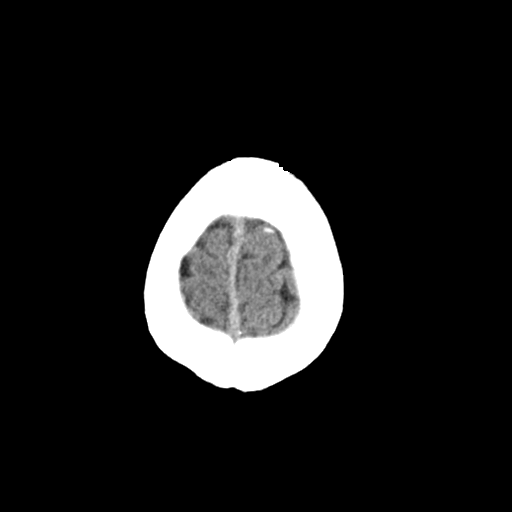

[Series 3: head bone · axial · 0.47mm/px · z∈[-318,-286]mm · 3 of 80 slices shown]
[im 8/80  bone]
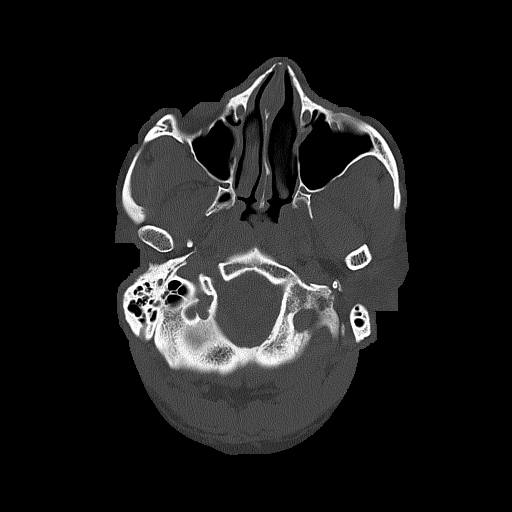
[im 16/80  bone]
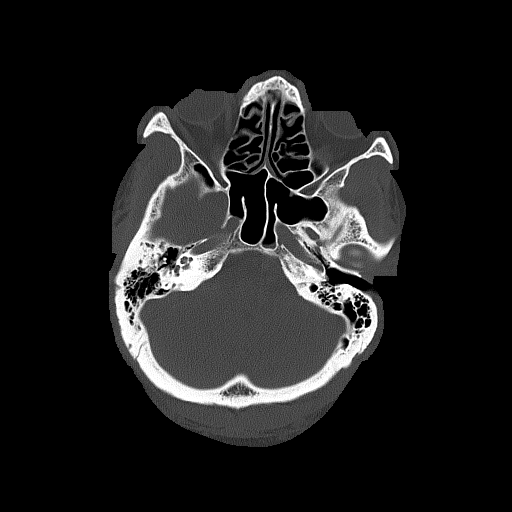
[im 24/80  bone]
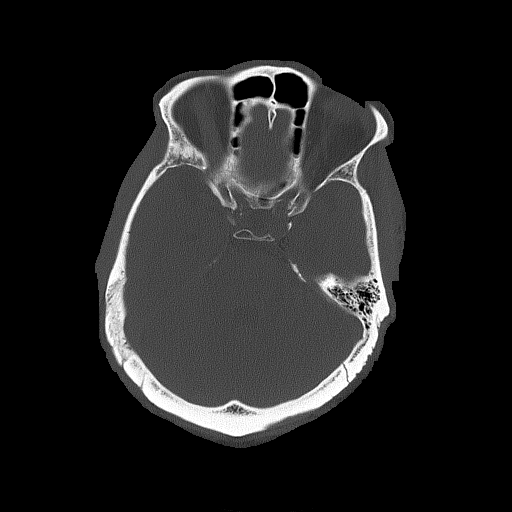

[Series 4: coronal soft · coronal · 0.31mm/px · 3 of 69 slices shown]
[im 23/69  brain]
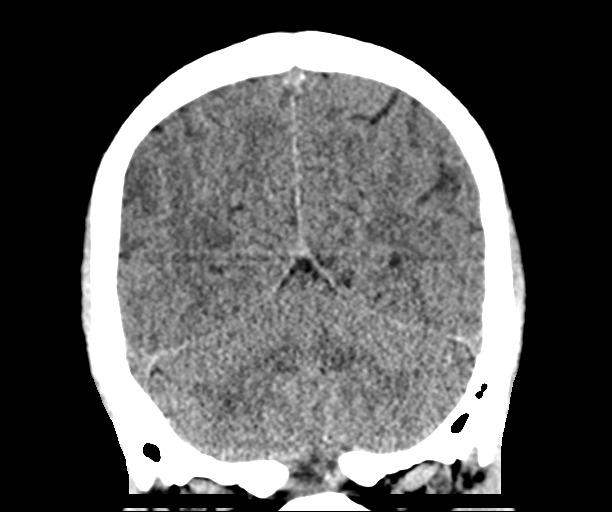
[im 31/69  brain]
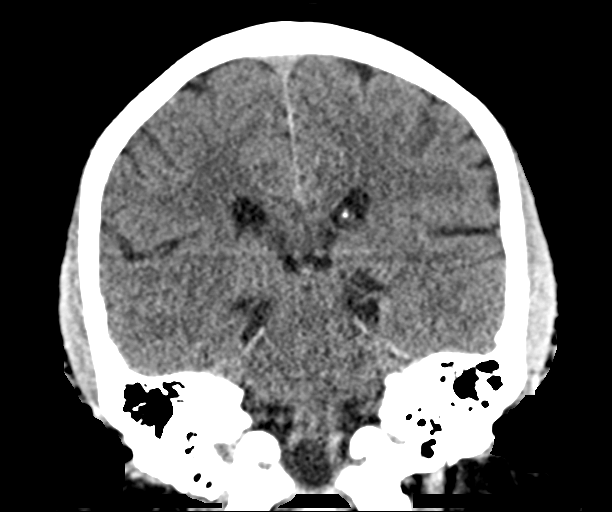
[im 38/69  brain]
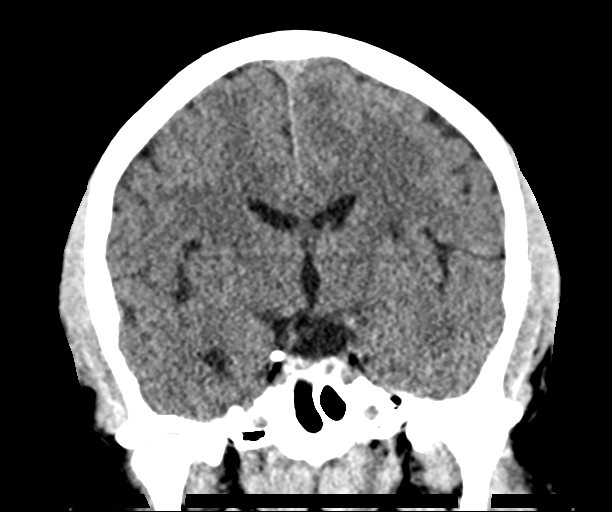

[Series 5: sag soft · sagittal · 0.31mm/px · 3 of 58 slices shown]
[im 20/58  brain]
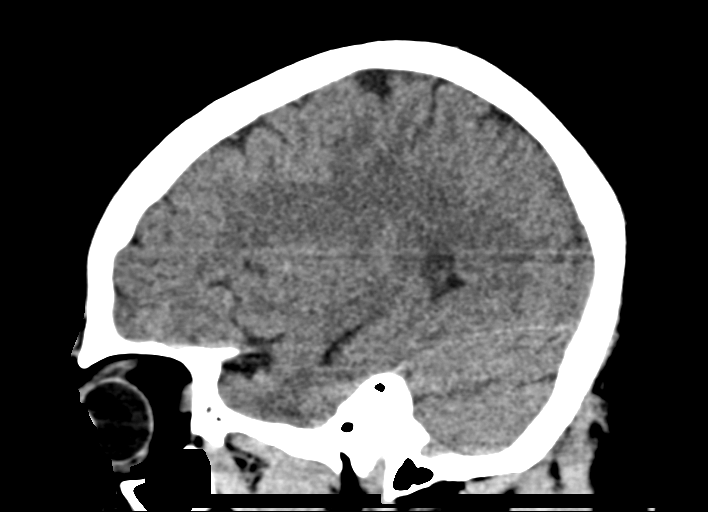
[im 29/58  brain]
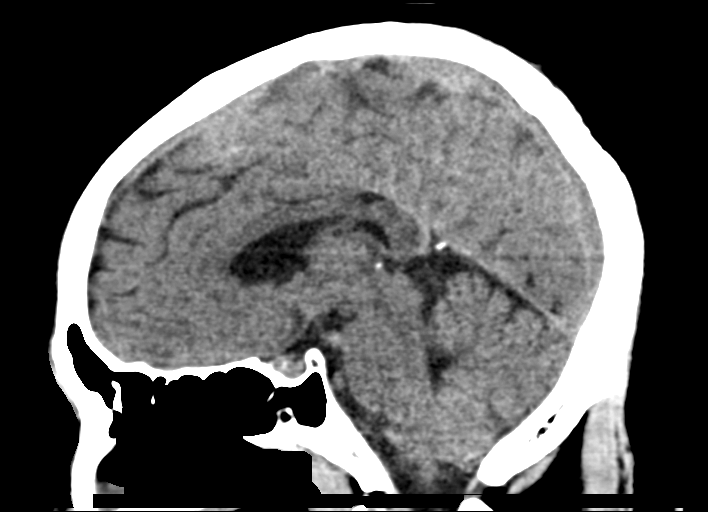
[im 39/58  brain]
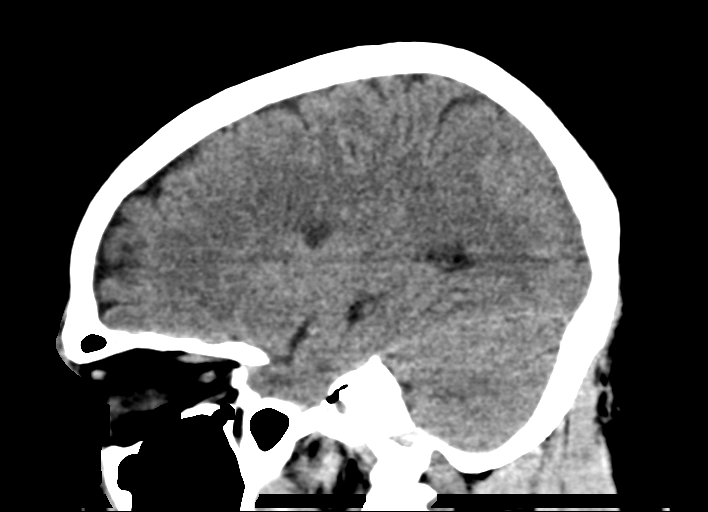

[16 of 47 positions shown; findings below may reference images not displayed]

FINDINGS: Brain: No acute intracranial hemorrhage, mass effect, or herniation.
No extra-axial fluid collections. No evidence of acute territorial
infarct. No hydrocephalus. Old lacunar infarct in the left basal
ganglia.

Vascular: No hyperdense vessel or unexpected calcification.

Skull: Normal. Negative for fracture or focal lesion.

Sinuses/Orbits: No acute finding.

Other: None.
IMPRESSION: No acute intracranial process identified. Old lacunar infarct in the
left basal ganglia.

## 2023-02-23 ENCOUNTER — Emergency Department (HOSPITAL_BASED_OUTPATIENT_CLINIC_OR_DEPARTMENT_OTHER): Payer: BC Managed Care – PPO

## 2023-02-23 ENCOUNTER — Emergency Department (HOSPITAL_BASED_OUTPATIENT_CLINIC_OR_DEPARTMENT_OTHER)
Admission: EM | Admit: 2023-02-23 | Discharge: 2023-02-23 | Disposition: A | Discharge status: Home / Self Care | Payer: BC Managed Care – PPO | Attending: Emergency Medicine | Admitting: Emergency Medicine

## 2023-02-23 ENCOUNTER — Other Ambulatory Visit: Payer: Self-pay

## 2023-02-23 ENCOUNTER — Encounter (HOSPITAL_BASED_OUTPATIENT_CLINIC_OR_DEPARTMENT_OTHER): Payer: Self-pay

## 2023-02-23 DIAGNOSIS — M79674 Pain in right toe(s): Secondary | ICD-10-CM | POA: Diagnosis present

## 2023-02-23 MED ORDER — OXYCODONE HCL 5 MG PO TABS
5.0000 mg | ORAL_TABLET | Freq: Once | ORAL | Status: AC
  Administered 2023-02-23: 5 mg via ORAL
  Filled 2023-02-23: qty 1

## 2023-02-23 MED ORDER — PREDNISONE 10 MG PO TABS: 20.0000 mg | ORAL_TABLET | Freq: Every day | ORAL | 0 refills | Status: AC

## 2023-02-23 MED ORDER — OXYCODONE HCL 5 MG PO TABS
5.0000 mg | ORAL_TABLET | Freq: Four times a day (QID) | ORAL | 0 refills | Status: AC | PRN
Start: 2023-02-23 — End: ?

## 2023-02-23 NOTE — ED Triage Notes (Signed)
Pt c/o right foot pain. Was seen & treated last week for gout but states pain is getting worse. Pt wearing boot on right foot that he states he already had at home.

## 2023-02-23 NOTE — ED Provider Notes (Signed)
 [place] [facility]PARTMENT AT [facility] Provider Note   CSN: [id number] Arrival date & time: 02/23/23  1946     History  Chief Complaint  Patient presents with   Foot Pain    Kevin Jefferson is a 58 y.o. male.  Patient here with ongoing right big toe pain.  He was put on prednisone for gout flare but a trip last night and the pain got worse today.  Nothing makes it worse or better.  History of CKD.  Denies any trauma.  He has been in walking boot.  Denies any fever or chills.  No weakness numbness tingling.  The history is provided by the patient.       Home Medications Prior to Admission medications   Medication Sig Start Date End Date Taking? Authorizing Provider  oxyCODONE (ROXICODONE) 5 MG immediate release tablet Take 1 tablet (5 mg total) by mouth every 6 (six) hours as needed for up to 10 doses. 02/23/23  Yes Kohei Antonellis, DO  predniSONE (DELTASONE) 10 MG tablet Take 2 tablets (20 mg total) by mouth daily for 4 days. 02/23/23 02/27/23 Yes Alnisa Hasley, DO  amLODipine (NORVASC) 10 MG tablet Take 1 tablet (10 mg total) by mouth daily. 05/24/22 06/23/22  Blue, Soijett A, PA-C  colchicine 0.6 MG tablet Take 0.5 tablets (0.3 mg total) by mouth daily. 03/24/18   Howard Pouch, MD  HYDROcodone-acetaminophen (NORCO/VICODIN) 5-325 MG tablet Take 1-2 tablets by mouth every 6 (six) hours as needed. 06/25/18   Maxwell Caul, PA-C  lisinopril (ZESTRIL) 20 MG tablet Take 1 tablet (20 mg total) by mouth daily. 05/24/22 06/23/22  Blue, Soijett A, PA-C  methylPREDNISolone (MEDROL DOSEPAK) 4 MG TBPK tablet Take as directed 05/24/22   Blue, Soijett A, PA-C  metoprolol succinate (TOPROL-XL) 50 MG 24 hr tablet Take 1 tablet (50 mg total) by mouth daily. 05/24/22   Blue, Soijett A, PA-C  simvastatin (ZOCOR) 20 MG tablet Take 1 tablet (20 mg total) by mouth every evening. 04/04/13   Rai, Ripudeep K, MD      Allergies    Hydralazine hcl    Review of Systems   Review of  Systems  Physical Exam Updated Vital Signs BP (!) 164/106 (BP Location: Left Arm)   Pulse 71   Temp 97.7 F (36.5 C)   Resp 18   Ht 5\' 11"  (1.803 m)   Wt 99.8 kg   SpO2 99%   BMI 30.68 kg/m  Physical Exam Vitals and nursing note reviewed.  Constitutional:      General: He is not in acute distress.    Appearance: He is well-developed. He is not ill-appearing.  HENT:     Head: Normocephalic and atraumatic.  Eyes:     Conjunctiva/sclera: Conjunctivae normal.  Cardiovascular:     Rate and Rhythm: Normal rate and regular rhythm.     Pulses: Normal pulses.     Heart sounds: No murmur heard. Pulmonary:     Effort: Pulmonary effort is normal. No respiratory distress.     Breath sounds: Normal breath sounds.  Abdominal:     Palpations: Abdomen is soft.     Tenderness: There is no abdominal tenderness.  Musculoskeletal:        General: Tenderness present. No swelling.     Cervical back: Normal range of motion and neck supple.     Comments: Tenderness to the right big toe but no major swelling redness erythema or drainage  Skin:    General: Skin  is warm and dry.     Capillary Refill: Capillary refill takes less than 2 seconds.  Neurological:     Mental Status: He is alert.  Psychiatric:        Mood and Affect: Mood normal.     ED Results / Procedures / Treatments   Labs (all labs ordered are listed, but only abnormal results are displayed) Labs Reviewed - No data to display  EKG None  Radiology DG Foot Complete Right Result Date: 02/23/2023 CLINICAL DATA:  Right foot pain. Treated last week for gout but the pain is getting worse. EXAM: RIGHT FOOT COMPLETE - 3+ VIEW COMPARISON:  None Available. FINDINGS: No acute fracture or dislocation. Mild swelling about the forefoot. 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 IMPRESSION: No acute fracture or dislocation. 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 Electronically Signed   By: Minerva Fester M.D.   On: 02/23/2023 20:59     Procedures Procedures    Medications Ordered in ED Medications  oxyCODONE (Oxy IR/ROXICODONE) immediate release tablet 5 mg (has no administration in time range)    ED Course/ Medical Decision Making/ A&P                                 Medical Decision Making Amount and/or Complexity of Data Reviewed Radiology: ordered.  Risk Prescription drug management.   Jamael Hoffmann is here with right toe pain.  History of the same.  Treated for gout with steroids but a trip last night.  He is neurovascular neuromuscular intact on exam.  There is no signs of infection on exam.  X-ray showed moderate arthritis of the first MTP joint.  But no fracture or dislocation.  He is not history of CKD.  I do think that this is likely gout or inflammatory process.  He can take colchicine.  Will extend out prednisone for couple more days.  Will write him for oxycodone for breakthrough pain.  Continue walking boot rest and ice.  Recommend follow-up with primary care doctor.  I have no concern for infectious process.  Patient discharged in good condition.  This chart was dictated using voice recognition software.  Despite best efforts to proofread,  errors can occur which can change the documentation meaning.         Final Clinical Impression(s) / ED Diagnoses Final diagnoses:  Great toe pain, right    Rx / DC Orders ED Discharge Orders          Ordered    predniSONE (DELTASONE) 10 MG tablet  Daily        02/23/23 2118    oxyCODONE (ROXICODONE) 5 MG immediate release tablet  Every 6 hours PRN        02/23/23 2119              Virgina Norfolk, DO 02/23/23 2127

## 2023-02-23 NOTE — Discharge Instructions (Signed)
Overall I suspect you have gout.  Follow-up with your primary care doctor.  Take narcotic pain medicine for breakthrough pain.  Continue your steroids.  Please try to avoid foods that cause gout.

## 2023-03-12 ENCOUNTER — Encounter (HOSPITAL_BASED_OUTPATIENT_CLINIC_OR_DEPARTMENT_OTHER): Payer: Self-pay | Admitting: Emergency Medicine

## 2023-03-12 ENCOUNTER — Other Ambulatory Visit: Payer: Self-pay

## 2023-03-12 ENCOUNTER — Emergency Department (HOSPITAL_BASED_OUTPATIENT_CLINIC_OR_DEPARTMENT_OTHER)
Admission: EM | Admit: 2023-03-12 | Discharge: 2023-03-12 | Disposition: A | Discharge status: Home / Self Care | Attending: Emergency Medicine | Admitting: Emergency Medicine

## 2023-03-12 DIAGNOSIS — Z79899 Other long term (current) drug therapy: Secondary | ICD-10-CM | POA: Insufficient documentation

## 2023-03-12 DIAGNOSIS — I160 Hypertensive urgency: Secondary | ICD-10-CM | POA: Diagnosis not present

## 2023-03-12 DIAGNOSIS — R059 Cough, unspecified: Secondary | ICD-10-CM | POA: Diagnosis present

## 2023-03-12 DIAGNOSIS — J101 Influenza due to other identified influenza virus with other respiratory manifestations: Secondary | ICD-10-CM | POA: Insufficient documentation

## 2023-03-12 DIAGNOSIS — N189 Chronic kidney disease, unspecified: Secondary | ICD-10-CM | POA: Diagnosis not present

## 2023-03-12 LAB — RESP PANEL BY RT-PCR (RSV, FLU A&B, COVID)  RVPGX2
Influenza A by PCR: POSITIVE — AB
Influenza B by PCR: NEGATIVE
Resp Syncytial Virus by PCR: NEGATIVE
SARS Coronavirus 2 by RT PCR: NEGATIVE

## 2023-03-12 MED ORDER — BENZONATATE 100 MG PO CAPS: 100.0000 mg | ORAL_CAPSULE | Freq: Three times a day (TID) | ORAL | 0 refills | Status: AC

## 2023-03-12 NOTE — Discharge Instructions (Addendum)
 You have the flu.  I have sent Tessalon Perles to your pharmacy to take as needed for cough.  If you have fever or muscle aches I would take Tylenol and ibuprofen.  You can take up to 1000 mg of Tylenol every 6 hours. Make sure you are staying well-hydrated with water he can alternate Pedialyte and Gatorade. If your symptoms begin to get worse or you start experiencing chest pain or shortness of breath please come back to emergency room.

## 2023-03-12 NOTE — ED Provider Notes (Signed)
  EMERGENCY DEPARTMENT AT [facility] Provider Note   CSN: [id number] Arrival date & time: 03/12/23  1515     History  Chief Complaint  Patient presents with   Cough    Kevin Jefferson is a 58 y.o. male patient with past medical history of chest pain, hypertensive urgency, chronic kidney disease, stroke, gout presenting to emergency room with complaint of 5 days of cough.  Patient reports his symptoms started with congestion, muscle aches, chills and fevers.  He reports his symptoms are starting to improve.  He reports no known sick contact.  Denies chest pain, shortness of breath abdominal pain nausea vomiting or diarrhea.   Cough      Home Medications Prior to Admission medications   Medication Sig Start Date End Date Taking? Authorizing Provider  benzonatate (TESSALON) 100 MG capsule Take 1 capsule (100 mg total) by mouth every 8 (eight) hours. 03/12/23  Yes Tynisa Vohs, Horald Chestnut, PA-C  amLODipine (NORVASC) 10 MG tablet Take 1 tablet (10 mg total) by mouth daily. 05/24/22 06/23/22  Blue, Soijett A, PA-C  colchicine 0.6 MG tablet Take 0.5 tablets (0.3 mg total) by mouth daily. 03/24/18   Howard Pouch, MD  HYDROcodone-acetaminophen (NORCO/VICODIN) 5-325 MG tablet Take 1-2 tablets by mouth every 6 (six) hours as needed. 06/25/18   Maxwell Caul, PA-C  lisinopril (ZESTRIL) 20 MG tablet Take 1 tablet (20 mg total) by mouth daily. 05/24/22 06/23/22  Blue, Soijett A, PA-C  methylPREDNISolone (MEDROL DOSEPAK) 4 MG TBPK tablet Take as directed 05/24/22   Blue, Soijett A, PA-C  metoprolol succinate (TOPROL-XL) 50 MG 24 hr tablet Take 1 tablet (50 mg total) by mouth daily. 05/24/22   Blue, Soijett A, PA-C  oxyCODONE (ROXICODONE) 5 MG immediate release tablet Take 1 tablet (5 mg total) by mouth every 6 (six) hours as needed for up to 10 doses. 02/23/23   Curatolo, Adam, DO  simvastatin (ZOCOR) 20 MG tablet Take 1 tablet (20 mg total) by mouth every evening. 04/04/13   Rai, Delene Ruffini, MD       Allergies    Hydralazine hcl    Review of Systems   Review of Systems  Respiratory:  Positive for cough.     Physical Exam Updated Vital Signs BP (!) 150/82 (BP Location: Right Arm)   Pulse 69   Temp 98.2 F (36.8 C)   Resp 16   Ht 5\' 11"  (1.803 m)   Wt 99.8 kg   SpO2 100%   BMI 30.69 kg/m  Physical Exam Vitals and nursing note reviewed.  Constitutional:      General: He is not in acute distress.    Appearance: He is not toxic-appearing.  HENT:     Head: Normocephalic and atraumatic.  Eyes:     General: No scleral icterus.    Conjunctiva/sclera: Conjunctivae normal.  Cardiovascular:     Rate and Rhythm: Normal rate and regular rhythm.     Pulses: Normal pulses.     Heart sounds: Normal heart sounds.  Pulmonary:     Effort: Pulmonary effort is normal. No respiratory distress.     Breath sounds: Normal breath sounds. No stridor. No wheezing, rhonchi or rales.  Abdominal:     General: Abdomen is flat. Bowel sounds are normal.     Palpations: Abdomen is soft.     Tenderness: There is no abdominal tenderness.  Musculoskeletal:     Right lower leg: No edema.     Left lower leg: No edema.  Skin:    General: Skin is warm and dry.     Findings: No lesion.  Neurological:     General: No focal deficit present.     Mental Status: He is alert and oriented to person, place, and time. Mental status is at baseline.     ED Results / Procedures / Treatments   Labs (all labs ordered are listed, but only abnormal results are displayed) Labs Reviewed  RESP PANEL BY RT-PCR (RSV, FLU A&B, COVID)  RVPGX2 - Abnormal; Notable for the following components:      Result Value   Influenza A by PCR POSITIVE (*)    All other components within normal limits    EKG None  Radiology No results found.  Procedures Procedures    Medications Ordered in ED Medications - No data to display  ED Course/ Medical Decision Making/ A&P                                 Medical  Decision Making Risk Prescription drug management.   Judie Bonus 58 y.o. presented today for URI like symptoms. Working DDx that I considered at this time includes, but not limited to, viral illness, pharyngitis, mono, sinusitis, electrolyte abnormality, AOM.  R/o DDx: these additional diagnoses are not consistent with patient's history, presentation, physical exam, labs/imaging findings.   Labs:  Respiratory Panel: Influenza A  Problem List / ED Course / Critical interventions / Medication management  He is reporting with 5 days of URI-like symptoms.  He is tested positive for influenza A.  His symptoms are beginning to improve.  He does not have productive cough.  Denies chest pain or shortness of breath.  He is hemodynamically stable and well-appearing.  He is not hypoxic.  Symptoms consistent with viral illness.  Do not feel further workup is necessary at this time.  Did discuss return precautions.  Do not feel patient is Tamiflu candidate considering symptoms have been ongoing for over 48 hours and he does not meet admission criteria.  Patients vitals assessed. Upon arrival patient is hemodynamically stable.  I have reviewed the patients home medicines and have made adjustments as needed     Plan:  F/u w/ PCP in 2-3d to ensure resolution of sx.  Patient was given return precautions. Patient stable for discharge at this time.  Patient educated on sx and dx and verbalized understanding of plan. Return to ER if new or worsening sx.          Final Clinical Impression(s) / ED Diagnoses Final diagnoses:  Influenza A    Rx / DC Orders ED Discharge Orders          Ordered    benzonatate (TESSALON) 100 MG capsule  Every 8 hours        03/12/23 1709              Charly Holcomb, Horald Chestnut, PA-C 03/12/23 1712    Sloan Leiter, DO 03/17/23 1949

## 2023-03-12 NOTE — ED Triage Notes (Signed)
 Pt c/o cough and chills x 1 wk

## 2023-03-27 ENCOUNTER — Emergency Department (HOSPITAL_COMMUNITY)
Admission: EM | Admit: 2023-03-27 | Discharge: 2023-03-27 | Disposition: A | Discharge status: Home / Self Care | Attending: Emergency Medicine | Admitting: Emergency Medicine

## 2023-03-27 ENCOUNTER — Emergency Department (HOSPITAL_COMMUNITY)

## 2023-03-27 DIAGNOSIS — D649 Anemia, unspecified: Secondary | ICD-10-CM | POA: Insufficient documentation

## 2023-03-27 DIAGNOSIS — I10 Essential (primary) hypertension: Secondary | ICD-10-CM | POA: Diagnosis not present

## 2023-03-27 DIAGNOSIS — Z79899 Other long term (current) drug therapy: Secondary | ICD-10-CM | POA: Diagnosis not present

## 2023-03-27 DIAGNOSIS — M25572 Pain in left ankle and joints of left foot: Secondary | ICD-10-CM | POA: Insufficient documentation

## 2023-03-27 DIAGNOSIS — M109 Gout, unspecified: Secondary | ICD-10-CM | POA: Diagnosis present

## 2023-03-27 LAB — BASIC METABOLIC PANEL
Anion gap: 8 (ref 5–15)
BUN: 17 mg/dL (ref 6–20)
CO2: 24 mmol/L (ref 22–32)
Calcium: 9 mg/dL (ref 8.9–10.3)
Chloride: 107 mmol/L (ref 98–111)
Creatinine, Ser: 1.85 mg/dL — ABNORMAL HIGH (ref 0.61–1.24)
GFR, Estimated: 42 mL/min — ABNORMAL LOW (ref >60)
Glucose, Bld: 112 mg/dL — ABNORMAL HIGH (ref 70–99)
Potassium: 3.8 mmol/L (ref 3.5–5.1)
Sodium: 139 mmol/L (ref 135–145)

## 2023-03-27 LAB — CBC WITH DIFFERENTIAL/PLATELET
Abs Immature Granulocytes: 0.05 10*3/uL (ref 0.00–0.07)
Basophils Absolute: 0 10*3/uL (ref 0.0–0.1)
Basophils Relative: 0 %
Eosinophils Absolute: 0 10*3/uL (ref 0.0–0.5)
Eosinophils Relative: 0 %
HCT: 33.9 % — ABNORMAL LOW (ref 39.0–52.0)
Hemoglobin: 10.7 g/dL — ABNORMAL LOW (ref 13.0–17.0)
Immature Granulocytes: 1 %
Lymphocytes Relative: 19 %
Lymphs Abs: 1.8 10*3/uL (ref 0.7–4.0)
MCH: 27.6 pg (ref 26.0–34.0)
MCHC: 31.6 g/dL (ref 30.0–36.0)
MCV: 87.6 fL (ref 80.0–100.0)
Monocytes Absolute: 0.8 10*3/uL (ref 0.1–1.0)
Monocytes Relative: 8 %
Neutro Abs: 6.9 10*3/uL (ref 1.7–7.7)
Neutrophils Relative %: 72 %
Platelets: 277 10*3/uL (ref 150–400)
RBC: 3.87 MIL/uL — ABNORMAL LOW (ref 4.22–5.81)
RDW: 15 % (ref 11.5–15.5)
WBC: 9.6 10*3/uL (ref 4.0–10.5)
nRBC: 0 % (ref 0.0–0.2)

## 2023-03-27 MED ORDER — COLCHICINE 0.6 MG PO TABS: 0.3000 mg | ORAL_TABLET | Freq: Every day | ORAL | 0 refills | Status: AC

## 2023-03-27 MED ORDER — METHYLPREDNISOLONE 4 MG PO TBPK: ORAL_TABLET | ORAL | 0 refills | Status: AC

## 2023-03-27 NOTE — ED Provider Notes (Signed)
 [place] EMERGENCY DEPARTMENT AT [facility] Provider Note   CSN: [id number] Arrival date & time: 03/27/23  1628     History  Chief Complaint  Patient presents with   Gout    Kevin Jefferson is a 57 y.o. male.  HPI Patient is a 58 year old male presents the ED today concerned with possible gouty attack in the left ankle.  States this pain began 2 days ago and has had increased swelling and pain..  Medical history of HTN, gout, stroke, disorder.  Previously seen in the ED on 02/23/2023 for suspected gout and great toe on right side.  He was given prednisone and states that symptoms have gotten better with last dose taken 2 weeks ago.  Reports having hardware from ankle surgery and left ankle.  Denies fever, fatigue, headache, vision changes, chest pain, shortness of breath, abdominal pain, nausea, vomiting, dysuria, diarrhea, weakness, numbness, tingling, hematochezia, melena, hematuria.    Home Medications Prior to Admission medications   Medication Sig Start Date End Date Taking? Authorizing Provider  amLODipine (NORVASC) 10 MG tablet Take 1 tablet (10 mg total) by mouth daily. 05/24/22 06/23/22  Blue, Soijett A, PA-C  benzonatate (TESSALON) 100 MG capsule Take 1 capsule (100 mg total) by mouth every 8 (eight) hours. 03/12/23   Barrett, Horald Chestnut, PA-C  colchicine 0.6 MG tablet Take 0.5 tablets (0.3 mg total) by mouth daily. 03/27/23   Lunette Stands, PA-C  HYDROcodone-acetaminophen (NORCO/VICODIN) 5-325 MG tablet Take 1-2 tablets by mouth every 6 (six) hours as needed. 06/25/18   Maxwell Caul, PA-C  lisinopril (ZESTRIL) 20 MG tablet Take 1 tablet (20 mg total) by mouth daily. 05/24/22 06/23/22  Blue, Soijett A, PA-C  methylPREDNISolone (MEDROL DOSEPAK) 4 MG TBPK tablet Take as directed 03/27/23   Lunette Stands, PA-C  metoprolol succinate (TOPROL-XL) 50 MG 24 hr tablet Take 1 tablet (50 mg total) by mouth daily. 05/24/22   Blue, Soijett A, PA-C  oxyCODONE (ROXICODONE) 5 MG  immediate release tablet Take 1 tablet (5 mg total) by mouth every 6 (six) hours as needed for up to 10 doses. 02/23/23   Curatolo, Adam, DO  simvastatin (ZOCOR) 20 MG tablet Take 1 tablet (20 mg total) by mouth every evening. 04/04/13   Rai, Delene Ruffini, MD      Allergies    Hydralazine hcl    Review of Systems   Review of Systems  Musculoskeletal:  Positive for arthralgias.  All other systems reviewed and are negative.   Physical Exam Updated Vital Signs BP (!) 166/96 (BP Location: Left Arm)   Pulse 69   Temp 97.9 F (36.6 C) (Oral)   Resp 18   SpO2 99%  Physical Exam Vitals and nursing note reviewed.  Constitutional:      General: He is not in acute distress.    Appearance: Normal appearance. He is not ill-appearing.  HENT:     Head: Normocephalic and atraumatic.  Eyes:     General: No scleral icterus.       Right eye: No discharge.        Left eye: No discharge.     Extraocular Movements: Extraocular movements intact.     Conjunctiva/sclera: Conjunctivae normal.  Cardiovascular:     Rate and Rhythm: Normal rate and regular rhythm.     Pulses: Normal pulses.     Heart sounds: Normal heart sounds. No murmur heard.    No friction rub. No gallop.  Pulmonary:     Effort:  Pulmonary effort is normal. No respiratory distress.     Breath sounds: No stridor. No wheezing, rhonchi or rales.  Abdominal:     General: Abdomen is flat. There is no distension.     Palpations: Abdomen is soft.     Tenderness: There is no abdominal tenderness. There is no guarding.  Musculoskeletal:        General: Swelling present.     Cervical back: No rigidity.     Right lower leg: No edema.     Comments: Patient noted to have edematous left ankle that is tender to palpation.  Skin:    General: Skin is warm and dry.     Coloration: Skin is not pale.     Findings: No bruising or erythema.  Neurological:     General: No focal deficit present.     Mental Status: He is alert and oriented to  person, place, and time. Mental status is at baseline.     Sensory: No sensory deficit.     Motor: No weakness.  Psychiatric:        Mood and Affect: Mood normal.     ED Results / Procedures / Treatments   Labs (all labs ordered are listed, but only abnormal results are displayed) Labs Reviewed  CBC WITH DIFFERENTIAL/PLATELET - Abnormal; Notable for the following components:      Result Value   RBC 3.87 (*)    Hemoglobin 10.7 (*)    HCT 33.9 (*)    All other components within normal limits  BASIC METABOLIC PANEL - Abnormal; Notable for the following components:   Glucose, Bld 112 (*)    Creatinine, Ser 1.85 (*)    GFR, Estimated 42 (*)    All other components within normal limits    EKG None  Radiology DG Ankle Complete Left Result Date: 03/27/2023 CLINICAL DATA:  Left ankle pain and swelling EXAM: LEFT ANKLE COMPLETE - 3+ VIEW COMPARISON:  09/03/2022 FINDINGS: Prior distal fibular ORIF with intact hardware. Posttraumatic distal tibiofibular synostosis. No acute fracture. No dislocation. Degenerative spurring in the medial gutter of the ankle. Mild soft tissue swelling at the lateral ankle. IMPRESSION: Mild soft tissue swelling at the lateral ankle. No acute fracture or dislocation. Electronically Signed   By: Duanne Guess D.O.   On: 03/27/2023 17:51    Procedures Procedures    Medications Ordered in ED Medications - No data to display  ED Course/ Medical Decision Making/ A&P                                 Medical Decision Making  Patient is a 58 year old male presents the ED today concerned with possible gouty attack in the left ankle.  States this pain began 2 days ago and has had increased swelling and pain..  Medical history of HTN, gout, stroke, disorder.  Previously seen in the ED on 02/23/2023 for suspected gout and great toe on right side.  He was given prednisone and states that symptoms have gotten better with last dose taken 2 weeks ago.  Reports having  hardware from ankle surgery and left ankle.  Has not been taking his colchicine due to being unaware that he has been prescribed this.  On physical exam, patient is to be afebrile, no acute distress, resting comfortably and speaking in full sentences.  He has a notable swelling and tenderness to palpation noted around left ankle.  Mild tenderness to  left calcaneal region.  Exam is otherwise unremarkable with normal cap refill, normal sensation.  X-ray has returned and showed soft swelling with no signs of infected hardware or fracture.  Due to him having previous gout in his foot and not having taken his colchicine while also having recently come off his prednisone.  I believe is most likely due to the gouty attack and not septic arthritis or cellulitis.  I believe the patient safe to discharge at this time and will provide prednisone as well as colchicine for him to follow with.  Half dose as previously prescribed due to kidney function.  Will also have him follow-up with PCP for further evaluation and redraw of his labs for his anemia.  But currently asymptomatic as I do not believe any other emergent pathology requiring emergent workup at this time.  Patient vital signs remained stable to the course of his time here.  Differential diagnoses prior to evaluation: The emergent differential diagnosis includes, but is not limited to, gout, septic arthritis, pseudogout, fracture, ligamentous injury. This is not an exhaustive differential.   Past Medical History / Co-morbidities / Social History: HTN, stroke, gout, renal disorder  Ankle fracture surgery on left ankle.  Additional history: Chart reviewed. Pertinent results include: Noted to have had surgery on left ankle on 09/07/2022 after a left ankle injury.  Seen on 02/23/2023 where he was provided prednisone and oxycodone for breakthrough pain and told to continue on colchicine.  When reviewing [facility] Everywhere, patient was noted to  have had gouty attack in left foot in 2024.  Lab Tests/Imaging studies: I personally interpreted labs/imaging and the pertinent results include:   CBC is notable for anemia with hemoglobin 10.7 decreased from  previous labs done 10 months ago BMP is notable for an elevated creatinine at 1.5 decreased GFR of 42 which seems improved from previous 10 months ago. X-ray left ankle does show soft tissue swelling but no signs of active infection or hardware disturbance. I agree with the radiologist interpretation.   Medications: I ordered medication including outpatient colchicine, prednisone, Tylenol.  I have reviewed the patients home medicines and have made adjustments as needed.  Disposition: After consideration of the diagnostic results and the patients response to treatment, I feel that the patient benefit from discharge and treatment as above.   emergency department workup does not suggest an emergent condition requiring admission or immediate intervention beyond what has been performed at this time. The plan is: follow-up PCP for noted anemia, colchicine, Tylenol, prednisone for possible gouty attack, and for any new or worsening symptoms. The patient is safe for discharge and has been instructed to return immediately for worsening symptoms, change in symptoms or any other concerns.  Final Clinical Impression(s) / ED Diagnoses Final diagnoses:  Acute left ankle pain    Rx / DC Orders ED Discharge Orders          Ordered    colchicine 0.6 MG tablet  Daily        03/27/23 1835    methylPREDNISolone (MEDROL DOSEPAK) 4 MG TBPK tablet        03/27/23 1836              Lunette Stands, PA-C 03/27/23 1839    Gerhard Munch, MD 03/27/23 [phone]

## 2023-03-27 NOTE — ED Triage Notes (Signed)
 Pt states that twice in the last two weeks he's been having R foot swelling that was related to gout. He states that on Friday he began having R foot swelling and pain similar to the recent gout diagnosis.

## 2023-03-27 NOTE — Discharge Instructions (Signed)
 You were seen today for left ankle pain that I assumed to be possibly gout.  Your x-rays and imaging were reassuring that no active source of infection is present at this time.  However CBC did note anemia that I believe you should follow-up with the PCP with for repeat lab draw to evaluate possible cause.  But due to you not being symptomatic at this time.  Do not believe it requires emergent workup.  I am prescribing you prednisone as well as refilling your colchicine which you are to take 1/2 tablet of due to your kidney function.  Return to the ED if you begin to experience worsening pain despite medication, fever, redness to the joint, weakness, numbness, tingling, fatigue, shortness of breath, chest pain.

## 2023-12-28 DIAGNOSIS — E669 Obesity, unspecified: Secondary | ICD-10-CM | POA: Insufficient documentation

## 2024-01-19 ENCOUNTER — Encounter: Payer: Self-pay | Admitting: Neurology

## 2024-01-19 ENCOUNTER — Ambulatory Visit (INDEPENDENT_AMBULATORY_CARE_PROVIDER_SITE_OTHER): Admitting: Neurology

## 2024-01-19 VITALS — BP 153/92 | HR 72 | Ht 70.0 in | Wt 227.8 lb

## 2024-01-19 DIAGNOSIS — R519 Headache, unspecified: Secondary | ICD-10-CM

## 2024-01-19 DIAGNOSIS — G43009 Migraine without aura, not intractable, without status migrainosus: Secondary | ICD-10-CM | POA: Diagnosis not present

## 2024-01-19 DIAGNOSIS — Z8673 Personal history of transient ischemic attack (TIA), and cerebral infarction without residual deficits: Secondary | ICD-10-CM

## 2024-01-19 DIAGNOSIS — G8929 Other chronic pain: Secondary | ICD-10-CM | POA: Diagnosis not present

## 2024-01-19 DIAGNOSIS — M542 Cervicalgia: Secondary | ICD-10-CM | POA: Diagnosis not present

## 2024-01-19 DIAGNOSIS — H8110 Benign paroxysmal vertigo, unspecified ear: Secondary | ICD-10-CM | POA: Diagnosis not present

## 2024-01-19 MED ORDER — NURTEC 75 MG PO TBDP: 75.0000 mg | ORAL_TABLET | ORAL | 3 refills | Status: AC | PRN

## 2024-01-19 MED ORDER — ASPIRIN 81 MG PO TBEC: 81.0000 mg | DELAYED_RELEASE_TABLET | Freq: Every day | ORAL | 12 refills | Status: AC

## 2024-01-19 MED ORDER — SIMVASTATIN 80 MG PO TABS: 80.0000 mg | ORAL_TABLET | Freq: Every evening | ORAL | 5 refills | Status: AC

## 2024-01-19 NOTE — Progress Notes (Signed)
 " Kevin Jefferson [address] [place]. [place] [address] [phone]       OFFICE CONSULT NOTE  Kevin Jefferson Date of Birth:  Jul 23, 1965 Medical Record Number:  [id number]   Referring MD: Kevin Jefferson,, PA-C  Reason for Referral: Headache and dizziness  HPI: Kevin Jefferson is a 59 year old pleasant African-American male seen today via office consultation visit companied by his wife.  History is obtained from them and review of electronic medical records and I personally reviewed pertinent available imaging films in PACS.  He has past medical history of hypertension, hyperlipidemia, gout as well as cryptogenic stroke in 2015 with no residual deficits.  Patient states over the last year or so has been having frequent headaches about 2-3 times a week.  Headaches usually wake him up from sleep is moderate to severe in intensity.  He describes a slight pressure-like for throbbing sensation on the vertex temples.  Light and sound bothers him.  He has to stop his activity and lie down.  Has tried taking Tylenol  wich helps but takes long time to work.  Going to sleep helps the headache.  Physical activity worsens the headache.  He is not able to describe any specific triggers though his wife feels that he was drinking a lot of caffeine which he has since refused but headache frequency has not improved.  He has not tried any stronger medications or headache prophylactic medications.  He does admit to having had some episodes of what sounds like migraine headaches as a teenager but he was never formally diagnosed.  Denies any specific aura but does admit to some visual disturbances and some of the headaches.  He also complains of a few episodes of sudden sharp midline posterior neck pain going down his spine.  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  Sometimes.  He denies any neck injury or accident.  Denies any numbness tingling in his hands or weakness.  He has also noticed episodes of  transient vertigo which are related to trying to get out of bed or sit up or move.  Denies any ringing in his ears no hearing loss.  No history of ear infections ,head injury with ear trauma. Prior office visit 07/12/2016 : Mr Kevin Jefferson is a 59 year male seen today for first office follow-up visit following hospital admission for TIA in March 2015. He presented with sudden onset of headache, dizziness and photophobia as well as lethargy and MRI scan showed a small right medial temporal infarct which I have personally reviewed the imaging films which was felt to be of cryptogenic etiology as workup including transesophageal echocardiogram, vascular studies and hypercoagulable panel were all unremarkable. He was enrolled in the Socrates research trial( aspirin  plus Brilinta versus aspirin  plus placebo) and computed participation but did not keep any schedule office follow-up visit after that. He states that patient was unable to work. He was under significant stress at that time since his son had passed away. He states is doing better now. He is past the department of transportation physical and is here to see me today to get a neurological clearance for driving. Patient states he is feeling a lot better now his blood pressure is much controlled. He is decreased salt intake. His blood pressure today is 139/91. He has stopped taking aspirin . Is tolerating Zocor  well without muscle aches or pains and states his last cholesterol was quite satisfactory. He has no new complaints today.    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  Tobacco Use   Smoking status: Never   Smokeless tobacco: Never  Vaping Use   Vaping status: Never Used  Substance and Sexual Activity   Alcohol use: No   Drug use: No   Sexual activity: Yes  Other Topics Concern   Not on file  Social History Narrative   Lives at home w/ his wife   Right-handed   Caffeine: 2 cups of coffee and 1 glass of tea per day   Social Drivers of Health   Tobacco Use: Low Risk (01/19/2024)   Patient History    Smoking Tobacco Use: Never    Smokeless Tobacco Use: Never    Passive Exposure: Not on file  Financial Resource Strain: Not on file  Food Insecurity: Not on file  Transportation Needs: Not on file  Physical Activity: Not on file  Stress: Not on file  Social Connections: Not on file  Intimate Partner Violence: Not on file  Depression (EYV7-0): Not on file  Alcohol Screen: Not on file  Housing: Not on file  Utilities: Not on file  Health Literacy: Not on file    Medications:  Medications Ordered Prior to Encounter[1]  Allergies:  Allergies[2]  Physical Exam General: well developed, well nourished middle-aged African-American male, seated, in no evident distress Head: head normocephalic and atraumatic.   Neck: supple with no carotid or supraclavicular bruits Cardiovascular: regular rate and rhythm, no murmurs Musculoskeletal: no deformity Skin:  no rash/petichiae Vascular:  Normal pulses all extremities  Neurologic Exam Mental Status: Awake and fully alert. Oriented to place and time. Recent and remote memory intact. Attention span, concentration and fund of knowledge appropriate. Mood and affect appropriate.  Cranial Nerves: Fundoscopic exam reveals sharp disc margins. Pupils equal, briskly reactive to light. Extraocular movements full without nystagmus. Visual fields full to confrontation. Hearing intact. Facial sensation  intact. Face, tongue, palate moves normally and symmetrically.  Motor: Normal bulk and tone. Normal strength in all tested extremity muscles. Sensory.: intact to touch , pinprick , position and vibratory sensation.  Coordination: Rapid alternating movements normal in all extremities. Finger-to-nose and heel-to-shin performed accurately bilaterally.  Fukuda stepping test patient moves of the base but does not rotate Gait and Station: Arises from chair without difficulty. Stance is normal. Gait demonstrates normal stride length and balance . Able to heel, toe and tandem walk with minimal difficulty.  Reflexes: 1+ and symmetric. Toes downgoing.      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  History of cryptogenic stroke and TIA in 2015.  Vascular risk factors of hyperlipidemia, hypertension, mild obesity and prior stroke     PLAN:I had a long discussion with the patient and his wife regarding his headaches which appear to be migraine headaches  with some somatic features.  Recommend trial of Nurtec 75 mg as needed and advised him to avoid headache provoking triggers.  I also encouraged him to increase participation in activities of stress laxation to do regular neck stretching exercises.  Recommend further evaluation with checking MRI scan of the brain and MRI scan of cervical spine.  Also encouraged him to see physical therapy for vestibular stabilization exercises for benign positional vertigo.  I recommend he start taking aspirin  81 mg daily for secondary stroke prevention and maintain aggressive risk factor modification with blood pressure goal below 140/90, LDL cholesterol goal below 70 mg percent, hemoglobin A1c goal below 6.5%.  I recommend we increase dose of simvastatin  to 80 mg daily for optimal  cholesterol control.  He was also encouraged to eat a healthy diet with lots of fruits, vegetables, cereals, whole grains and to be active and exercise regularly..  Return for follow-up in the future in 6 to 8 months or call earlier if necessary.   I personally spent a total of 60 minutes in the care of the patient today including getting/reviewing separately obtained history, performing a medically appropriate exam/evaluation, counseling and educating, placing orders, referring and communicating with other health care professionals, documenting clinical information in the EHR, independently interpreting results, and coordinating care.        Eather Popp, MD Note: This document was prepared with digital dictation and possible smart phrase technology. Any transcriptional errors that result from this process are unintentional.      [1]  Current Outpatient Medications on File Prior to Visit  Medication Sig Dispense Refill   amLODipine  (NORVASC ) 10 MG tablet Take 1 tablet (10 mg total) by mouth daily. 30 tablet 2   benzonatate  (TESSALON ) 100 MG capsule Take 1 capsule (100 mg total) by mouth every 8 (eight) hours. 21 capsule 0   carvedilol (COREG) 25 MG tablet Take 25 mg by mouth.     colchicine  0.6 MG tablet Take 0.5 tablets (0.3 mg total) by mouth daily. 4 tablet 0   cyanocobalamin (VITAMIN B12) 1000 MCG tablet Take 1,000 mcg by mouth daily.     ezetimibe (ZETIA) 10 MG tablet Take 10 mg by mouth.     folic acid (FOLVITE) 1 MG tablet Take 1 mg by mouth daily.     HYDROcodone -acetaminophen  (NORCO/VICODIN) 5-325 MG tablet Take 1-2 tablets by mouth every 6 (six) hours as needed. 8 tablet 0   lisinopril  (ZESTRIL ) 20 MG tablet Take 1 tablet (20 mg total) by mouth daily. 30 tablet 2   methylPREDNISolone  (MEDROL  DOSEPAK) 4 MG TBPK tablet Take as directed 21 tablet 0   metoprolol  succinate (TOPROL -XL) 50 MG 24 hr tablet Take 1 tablet (50 mg total) by mouth daily. 30 tablet 2   oxyCODONE  (ROXICODONE ) 5  MG immediate release tablet Take 1 tablet (5 mg total) by mouth every 6 (six) hours as needed for up to 10 doses. 10 tablet 0   rosuvastatin (CRESTOR) 40 MG tablet Take 40 mg by mouth daily.     simvastatin  (ZOCOR ) 20 MG tablet Take 1 tablet (20 mg total) by mouth every evening. 30 tablet 4   Vitamin D, Ergocalciferol, (DRISDOL) 1.25 MG (50000 UNIT) CAPS capsule Take 50,000 Units by mouth once a week.     No current facility-administered medications on file prior to visit.  [2]  Allergies Allergen Reactions   Hydralazine  Hcl Shortness Of Breath    SOB ? PT is unsure if he is allergic to this  Hydrochlorothiazide  Other (See Comments)    Induce gout   "

## 2024-01-19 NOTE — Patient Instructions (Signed)
 I had a long discussion with the patient and his wife regarding his headaches which appear to be migraine headaches with some somatic features.  Recommend trial of Nurtec 75 mg as needed and advised him to avoid headache provoking triggers.  I also encouraged him to increase participation in activities of stress laxation to do regular neck stretching exercises.  Recommend further evaluation with checking MRI scan of the brain and MRI scan of cervical spine.  Also encouraged him to see physical therapy for vestibular stabilization exercises for benign positional vertigo.  I recommend he start taking aspirin  81 mg daily for secondary stroke prevention and maintain aggressive risk factor modification with blood pressure goal below 140/90, LDL cholesterol goal below 70 mg percent, hemoglobin A1c goal below 6.5%.  I recommend we increase dose of simvastatin  to 80 mg daily for optimal cholesterol control.  He was also encouraged to eat a healthy diet with lots of fruits, vegetables, cereals, whole grains and to be active and exercise regularly..  Return for follow-up in the future in 6 to 8 months or call earlier if necessary.  Benign Positional Vertigo Vertigo is the feeling that you or your surroundings are moving when they are not. Benign positional vertigo is the most common form of vertigo. This is usually a harmless condition (benign). This condition is positional. This means that symptoms are triggered by certain movements and positions. This condition can be dangerous if it occurs while you are doing something that could cause harm to yourself or others. This includes activities such as driving or operating machinery. What are the causes? The inner ear has fluid-filled canals that help your brain sense movement and balance. When the fluid moves, the brain receives messages about your body's position. With benign positional vertigo, calcium crystals in the inner ear break free and disturb the inner ear area.  This causes your brain to receive confusing messages about your body's position. What increases the risk? You are more likely to develop this condition if: You are a woman. You are 24 years of age or older. You have recently had a head injury. You have an inner ear disease. What are the signs or symptoms? Symptoms of this condition usually happen when you move your head or your eyes in different directions. Symptoms may start suddenly and usually last for less than a minute. They include: Loss of balance and falling. Feeling like you are spinning or moving. Feeling like your surroundings are spinning or moving. Nausea and vomiting. Blurred vision. Dizziness. Involuntary eye movement (nystagmus). Symptoms can be mild and cause only minor problems, or they can be severe and interfere with daily life. Episodes of benign positional vertigo may return (recur) over time. Symptoms may also improve over time. How is this diagnosed? This condition may be diagnosed based on: Your medical history. A physical exam of the head, neck, and ears. Positional tests to check for or stimulate vertigo. You may be asked to turn your head and change positions, such as going from sitting to lying down. A health care provider will watch for symptoms of vertigo. You may be referred to a health care provider who specializes in ear, nose, and throat problems (ENT or otolaryngologist) or a provider who specializes in disorders of the nervous system (neurologist). How is this treated?  This condition may be treated in a session in which your health care provider moves your head in specific positions to help the displaced crystals in your inner ear move. Treatment for  this condition may take several sessions. Surgery may be needed in severe cases, but this is rare. In some cases, benign positional vertigo may resolve on its own in 2-4 weeks. Follow these instructions at home: Safety Move slowly. Avoid sudden body or  head movements or certain positions, as told by your health care provider. Avoid driving or operating machinery until your health care provider says it is safe. Avoid doing any tasks that would be dangerous to you or others if vertigo occurs. If you have trouble walking or keeping your balance, try using a cane for stability. If you feel dizzy or unstable, sit down right away. Return to your normal activities as told by your health care provider. Ask your health care provider what activities are safe for you. General instructions Take over-the-counter and prescription medicines only as told by your health care provider. Drink enough fluid to keep your urine pale yellow. Keep all follow-up visits. This is important. Contact a health care provider if: You have a fever. Your condition gets worse or you develop new symptoms. Your family or friends notice any behavioral changes. You have nausea or vomiting that gets worse. You have numbness or a prickling and tingling sensation. Get help right away if you: Have difficulty speaking or moving. Are always dizzy or faint. Develop severe headaches. Have weakness in your legs or arms. Have changes in your hearing or vision. Develop a stiff neck. Develop sensitivity to light. These symptoms may represent a serious problem that is an emergency. Do not wait to see if the symptoms will go away. Get medical help right away. Call your local emergency services (911 in the [place] Do not drive yourself to the hospital. Summary Vertigo is the feeling that you or your surroundings are moving when they are not. Benign positional vertigo is the most common form of vertigo. This condition is caused by calcium crystals in the inner ear that become displaced. This causes a disturbance in an area of the inner ear that helps your brain sense movement and balance. Symptoms include loss of balance and falling, feeling that you or your surroundings are moving, nausea and  vomiting, and blurred vision. This condition can be diagnosed based on symptoms, a physical exam, and positional tests. Follow safety instructions as told by your health care provider and keep all follow-up visits. This is important. This information is not intended to replace advice given to you by your health care provider. Make sure you discuss any questions you have with your health care provider. Document Revised: 07/19/2022 Document Reviewed: 07/19/2022 Elsevier Patient Education  2024 [employer].

## 2024-01-24 ENCOUNTER — Ambulatory Visit: Admitting: Rehabilitation

## 2024-01-24 ENCOUNTER — Telehealth: Payer: Self-pay | Admitting: Neurology

## 2024-01-24 NOTE — Telephone Encounter (Signed)
 For the brain MRI his insurance is asking that the office notes say:  notes from your doctor that say your headaches are getting worse (more frequent or more painful).  This is what they sent back for the cervical MRI:  notes from your doctor explaining why you need this test. The notes we received didn't tell us  enough about your problem or why this test is needed. We want to know more about the problem you are having and how this test will help your doctor decide the best way to treat you.

## 2024-01-26 NOTE — Telephone Encounter (Signed)
 I withdrew the prior authorizations and will try to resubmit them on Monday.

## 2024-01-29 NOTE — Therapy (Incomplete)
 " OUTPATIENT PHYSICAL THERAPY VESTIBULAR EVALUATION     Patient Name: Kevin Jefferson MRN: [id number] DOB:1965-10-04, 59 y.o., male Today's Date: 01/29/2024  END OF SESSION:   Past Medical History:  Diagnosis Date   Gout    Hypertension    Renal disorder    Stroke [facility])    Past Surgical History:  Procedure Laterality Date   ANKLE FRACTURE SURGERY     TEE WITHOUT CARDIOVERSION N/A 04/03/2013   Procedure: TRANSESOPHAGEAL ECHOCARDIOGRAM (TEE);  Surgeon: Redell GORMAN Shallow, MD;  Location: [facility] ENDOSCOPY;  Service: Cardiovascular;  Laterality: N/A;   Patient Active Problem List   Diagnosis Date Noted   Obesity (BMI 30-39.9) 12/28/2023   Acute gout of left foot 09/07/2022   Contusion of left ankle 09/07/2022   Accelerated hypertension 06/25/2021   Acute kidney injury 06/25/2021   Adrenal adenoma, right 01/12/2018   Aortic atherosclerosis 01/12/2018   B12 deficiency anemia 01/12/2018   Dyslipidemia, goal LDL below 70 01/12/2018   Influenza vaccination declined 01/12/2018   Vitamin D deficiency 01/12/2018   Late effect of cerebrovascular accident (CVA) 09/26/2015   Cerebral infarction (HCC) 04/03/2013   Chest pain 04/01/2013   Encephalopathy 04/01/2013   Hypertensive urgency 04/01/2013   Right sided weakness 04/01/2013   CKD (chronic kidney disease) stage 3, GFR 30-59 ml/min (HCC) 04/01/2013   Headache 04/01/2013   Left ventricular hypertrophy 10/29/2011    PCP: *** REFERRING PROVIDER: ***  REFERRING DIAG: ***  THERAPY DIAG:  No diagnosis found.  ONSET DATE: ***  Rationale for Evaluation and Treatment: {HABREHAB:27488}  SUBJECTIVE:   SUBJECTIVE STATEMENT: *** Pt accompanied by: {accompnied:27141}  PERTINENT HISTORY: ***  PAIN:  Are you having pain? {OPRCPAIN:27236}  PRECAUTIONS: {Therapy precautions:24002}  RED FLAGS: {PT Red Flags:29287}   WEIGHT BEARING RESTRICTIONS: {Yes ***/No:24003}  FALLS: Has patient fallen in last 6 months?  {fallsyesno:27318}  LIVING ENVIRONMENT: Lives with: {OPRC lives with:25569::lives with their family} Lives in: {Lives in:25570} Stairs: {opstairs:27293} Has following equipment at home: {Assistive devices:23999}  PLOF: {PLOF:24004}  PATIENT GOALS: ***  OBJECTIVE:  Note: Objective measures were completed at Evaluation unless otherwise noted.  DIAGNOSTIC FINDINGS: ***  COGNITION: Overall cognitive status: {cognition:24006}   SENSATION: {sensation:27233}  EDEMA:  {edema:24020}  MUSCLE TONE:  {LE tone:25568}  DTRs:  {DTR SITE:24025}  POSTURE:  {posture:25561}  Cervical ROM:    {AROM/PROM:27142} A/PROM (deg) eval  Flexion   Extension   Right lateral flexion   Left lateral flexion   Right rotation   Left rotation   (Blank rows = not tested)  STRENGTH: ***  LOWER EXTREMITY MMT:   MMT Right eval Left eval  Hip flexion    Hip abduction    Hip adduction    Hip internal rotation    Hip external rotation    Knee flexion    Knee extension    Ankle dorsiflexion    Ankle plantarflexion    Ankle inversion    Ankle eversion    (Blank rows = not tested)  BED MOBILITY:  {Bed mobility:24027}  TRANSFERS: Assistive device utilized: {Assistive devices:23999}  Sit to stand: {Levels of assistance:24026} Stand to sit: {Levels of assistance:24026} Chair to chair: {Levels of assistance:24026} Floor: {Levels of assistance:24026}  RAMP: {Levels of assistance:24026}  CURB: {Levels of assistance:24026}  GAIT: Gait pattern: {gait characteristics:25376} Distance walked: *** Assistive device utilized: {Assistive devices:23999} Level of assistance: {Levels of assistance:24026} Comments: ***  FUNCTIONAL TESTS:  {Functional tests:24029}  PATIENT SURVEYS:  {rehab surveys:24030}  VESTIBULAR ASSESSMENT:  GENERAL OBSERVATION: ***  SYMPTOM BEHAVIOR:  Subjective history: ***  Non-Vestibular symptoms: {nonvestibular symptoms:25260}  Type of dizziness: {Type  of Dizziness:25255}  Frequency: ***  Duration: ***  Aggravating factors: {Aggravating Factors:25258}  Relieving factors: {Relieving Factors:25259}  Progression of symptoms: {DESC; BETTER/WORSE:18575}  OCULOMOTOR EXAM:  Ocular Alignment: {Ocular Alignment:25262}  Ocular ROM: {RANGE OF MOTION:21649}  Spontaneous Nystagmus: {Spontaneous nystagmus:25263}  Gaze-Induced Nystagmus: {gaze-induced nystagmus:25264}  Smooth Pursuits: {smooth pursuit:25265}  Saccades: {saccades:25266}  Convergence/Divergence: *** cm   Cover-cross-cover test: {cover test:33756}  FRENZEL - FIXATION SUPRESSED:  Ocular Alignment: {Ocular Alignment:25262}  Spontaneous Nystagmus: {Spontaneous nystagmus:25263}  Gaze-Induced Nystagmus: {gaze-induced nystagmus:25264}  Horizontal head shaking - induced nystagmus: {head shaking induced nystagmus:25267}  Vertical head shaking - induced nystagmus: {head shaking induced nystagmus:25267}  Positional tests: {Positional tests:25271}  Pressure tests: {frenzel pressure tests:25268}  VESTIBULAR - OCULAR REFLEX:   Slow VOR: {slow VOR:25290}  VOR Cancellation: {vor cancellation:25291}  Head-Impulse Test: {head impulse test:25272}  Dynamic Visual Acuity: {dynamic visual acuity:25273}   POSITIONAL TESTING: {Positional tests:25271}  MOTION SENSITIVITY:  Motion Sensitivity Quotient Intensity: 0 = none, 1 = Lightheaded, 2 = Mild, 3 = Moderate, 4 = Severe, 5 = Vomiting  Intensity  1. Sitting to supine   2. Supine to L side   3. Supine to R side   4. Supine to sitting   5. L Hallpike-Dix   6. Up from L    7. R Hallpike-Dix   8. Up from R    9. Sitting, head tipped to L knee   10. Head up from L knee   11. Sitting, head tipped to R knee   12. Head up from R knee   13. Sitting head turns x5   14.Sitting head nods x5   15. In stance, 180 turn to L    16. In stance, 180 turn to R     OTHOSTATICS: {Exam; orthostatics:31331}  FUNCTIONAL GAIT: {Functional  tests:24029}                                                                                                                             TREATMENT DATE: ***   Canalith Repositioning:  {Canalith Repositioning:25283} Gaze Adaptation:  {gaze adaptation:25286} Habituation:  {habituation:25288} Other: ***  PATIENT EDUCATION: Education details: *** Person educated: {Person educated:25204} Education method: {Education Method:25205} Education comprehension: {Education Comprehension:25206}  HOME EXERCISE PROGRAM:  GOALS: Goals reviewed with patient? {yes/no:20286}  SHORT TERM GOALS: Target date: ***  *** Baseline: Goal status: {GOALSTATUS:25110}  2.  *** Baseline:  Goal status: {GOALSTATUS:25110}  3.  *** Baseline:  Goal status: {GOALSTATUS:25110}  4.  *** Baseline:  Goal status: {GOALSTATUS:25110}  5.  *** Baseline:  Goal status: {GOALSTATUS:25110}  6.  *** Baseline:  Goal status: {GOALSTATUS:25110}  LONG TERM GOALS: Target date: ***  *** Baseline:  Goal status: {GOALSTATUS:25110}  2.  *** Baseline:  Goal status: {GOALSTATUS:25110}  3.  *** Baseline:  Goal status: {GOALSTATUS:25110}  4.  *** Baseline:  Goal status: {GOALSTATUS:25110}  5.  *** Baseline:  Goal status: {GOALSTATUS:25110}  6.  *** Baseline:  Goal status: {GOALSTATUS:25110}  ASSESSMENT:  CLINICAL IMPRESSION: Patient is a *** y.o. *** who was seen today for physical therapy evaluation and treatment for ***.   OBJECTIVE IMPAIRMENTS: {opptimpairments:25111}.   ACTIVITY LIMITATIONS: {activitylimitations:27494}  PARTICIPATION LIMITATIONS: {participationrestrictions:25113}  PERSONAL FACTORS: {Personal factors:25162} are also affecting patient's functional outcome.   REHAB POTENTIAL: {rehabpotential:25112}  CLINICAL DECISION MAKING: {clinical decision making:25114}  EVALUATION COMPLEXITY: {Evaluation complexity:25115}   PLAN:  PT FREQUENCY: {rehab frequency:25116}  PT  DURATION: {rehab duration:25117}  PLANNED INTERVENTIONS: {rehab planned interventions:25118::97110-Therapeutic exercises,97530- Therapeutic [phone]- Neuromuscular re-education,97535- Self Rjmz,02859- Manual therapy,Patient/Family education}  PLAN FOR NEXT SESSION: PIERRETTE RED SENIOR, PT 01/29/2024, 8:03 PM  "

## 2024-01-29 NOTE — Therapy (Signed)
 " OUTPATIENT PHYSICAL THERAPY VESTIBULAR EVALUATION     Patient Name: Kevin Jefferson MRN: [id number] DOB:Dec 26, 1965, 59 y.o., male Today's Date: 02/01/2024  END OF SESSION:  PT End of Session - 02/01/24 0858     Visit Number 1    Date for Recertification  03/28/24    PT Start Time 0857   10' late   PT Stop Time 0940    PT Time Calculation (min) 43 min    Activity Tolerance Patient tolerated treatment well    Behavior During Therapy [facility] for tasks assessed/performed          Past Medical History:  Diagnosis Date   Gout    Hypertension    Renal disorder    Stroke [facility])    Past Surgical History:  Procedure Laterality Date   ANKLE FRACTURE SURGERY     TEE WITHOUT CARDIOVERSION N/A 04/03/2013   Procedure: TRANSESOPHAGEAL ECHOCARDIOGRAM (TEE);  Surgeon: Redell Jefferson Shallow, MD;  Location: [facility] ENDOSCOPY;  Service: Cardiovascular;  Laterality: N/A;   Patient Active Problem List   Diagnosis Date Noted   Obesity (BMI 30-39.9) 12/28/2023   Acute gout of left foot 09/07/2022   Contusion of left ankle 09/07/2022   Accelerated hypertension 06/25/2021   Acute kidney injury 06/25/2021   Adrenal adenoma, right 01/12/2018   Aortic atherosclerosis 01/12/2018   B12 deficiency anemia 01/12/2018   Dyslipidemia, goal LDL below 70 01/12/2018   Influenza vaccination declined 01/12/2018   Vitamin D deficiency 01/12/2018   Late effect of cerebrovascular accident (CVA) 09/26/2015   Cerebral infarction (HCC) 04/03/2013   Chest pain 04/01/2013   Encephalopathy 04/01/2013   Hypertensive urgency 04/01/2013   Right sided weakness 04/01/2013   CKD (chronic kidney disease) stage 3, GFR 30-59 ml/min (HCC) 04/01/2013   Headache 04/01/2013   Left ventricular hypertrophy 10/29/2011    PCP:  REFERRING PROVIDER: Rosemarie Rothman MD  REFERRING DIAG: H81.10 (ICD-10-CM) - Benign paroxysmal positional vertigo, unspecified laterality  THERAPY DIAG:  Dizziness and giddiness  Cervicalgia  Difficulty in  walking, not elsewhere classified  ONSET DATE: last 6- 8 months (difficult for patient to tell)  Rationale for Evaluation and Treatment: Rehabilitation  SUBJECTIVE:   SUBJECTIVE STATEMENT: 59 y/o patient referred to PT from Dr Kevin for BPPV.  He states he gets faint feeling when he stands up sometimes.   He also reports when he rolls quickly in the bed he gets dizzy feeling.   Transferring sit to and from supine does not seem to bother him per his report.      Denies any dizziness when reading.   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  He denies any difficulty with driving in the daytime hours.   Patient also reports posterior neck pain and headaches. States the headaches come and go, but have been better since he started a new BP medication about 3 weeks ago.   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 Yes: NPRS scale: 0/10 on arrival;  3-4/10 after PT today;  6/10 worst Pain location: posterior neck and upper trapezius area Pain description: aching, radiating Aggravating factors: can't think of anything that increases his pain particularly Relieving factors: rest, meds  PRECAUTIONS: None  RED FLAGS: None   WEIGHT BEARING RESTRICTIONS: No  FALLS: Has patient fallen in last 6 months? No  LIVING ENVIRONMENT: Lives  with: lives with their family and lives with their spouse Lives in: House/apartment Stairs: unknown Has following equipment at home: None  PLOF: Independent with gait  PATIENT GOALS: not get dizzy and stumble, not have neck pain  OBJECTIVE:  Note: Objective measures were completed at Evaluation unless otherwise noted.  DIAGNOSTIC FINDINGS:  CLINICAL DATA:  Neuro deficit, acute, stroke suspected   EXAM: MRI HEAD WITHOUT CONTRAST   TECHNIQUE: Multiplanar, multiecho  pulse sequences of the brain and surrounding structures were obtained without intravenous contrast.   COMPARISON:  Same day CT head.  MRI 01/25/2014.   FINDINGS: Motion limited study.   Brain: No acute infarction, hemorrhage, hydrocephalus, extra-axial collection or mass lesion. Remote lacunar infarct in the left corona radiata. A few additional T2/FLAIR hyperintensities in the white matter, nonspecific but compatible with chronic microvascular ischemic disease   Vascular: Major arterial flow voids are maintained skull base.   Skull and upper cervical spine: Normal marrow signal.   Sinuses/Orbits: Mild paranasal sinus mucosal thickening. Unremarkable orbits.   Other: No sizable mastoid effusions   IMPRESSION: 1. No evidence of acute abnormality on this motion limited study. 2. Remote lacunar infarct in the left corona radiata.     Electronically Signed   By: Kevin Jefferson Molt M.D.   On: 03/01/2021 15:28    COGNITION: Overall cognitive status: Within functional limits for tasks assessed   SENSATION: WFL  EDEMA:  None noted  MUSCLE TONE:  NT  DTRs:  NT  POSTURE:  rounded shoulders and forward head  Cervical ROM:    Active A/PROM (deg) eval  Flexion 100%  Extension 40%  Right lateral flexion 25%  Left lateral flexion 25%  Right rotation 60%  Left rotation 40%  (Blank rows = not tested)  STRENGTH: UPPER EXTREMITY MMT:  MMT Right eval Left eval  Shoulder flexion 5 5  Shoulder extension    Shoulder abduction 5 5  Shoulder adduction    Shoulder extension    Shoulder internal rotation 5 5  Shoulder external rotation 5 5  Middle trapezius    Lower trapezius    Elbow flexion 5 5  Elbow extension 5 5  Wrist flexion    Wrist extension    Wrist ulnar deviation    Wrist radial deviation    Wrist pronation    Wrist supination    Grip strength     (Blank rows = not tested)   LOWER EXTREMITY MMT:   MMT Right eval Left eval  Hip flexion     Hip abduction    Hip adduction    Hip internal rotation    Hip external rotation    Knee flexion    Knee extension    Ankle dorsiflexion    Ankle plantarflexion    Ankle inversion    Ankle eversion    (Blank rows = not tested)  BED MOBILITY:  Sit to supine Complete Independence Supine to sit Complete Independence  TRANSFERS: Assistive device utilized: None  Sit to stand: Complete Independence Stand to sit: Complete Independence Chair to chair: Complete Independence Floor: NT  GAIT: Gait pattern: slow cadence, somewhat tentative, holds cervical spine stationary with no movement during gait Distance walked: into clinic x 150' Assistive device utilized: None Level of assistance: Complete Independence Comments:   FUNCTIONAL TESTS:  TBD  PATIENT SURVEYS:  DHI: THE DIZZINESS HANDICAP INVENTORY (DHI)           02/01/24 P1. Does looking up increase your problem? 0 = No  E2.  Because of your problem, do you feel frustrated? 2 = Sometimes  F3. Because of your problem, do you restrict your travel for business or recreation?  0 = No  P4. Does walking down the aisle of a supermarket increase your problems?  0 = No  F5. Because of your problem, do you have difficulty getting into or out of bed?  0 = No  F6. Does your problem significantly restrict your participation in social activities, such as going out to dinner, going to the movies, dancing, or going to parties? 0 = No  F7. Because of your problem, do you have difficulty reading?  0 = No  P8. Does performing more ambitious activities such as sports, dancing, household chores (sweeping or putting dishes away) increase your problems?  0 = No  E9. Because of your problem, are you afraid to leave your home without having without having someone accompany you?  2 = Sometimes  E10. Because of your problem have you been embarrassed in front of others?  4 = Yes  P11. Do quick movements of your head increase your problem?  2 = Sometimes   F12. Because of your problem, do you avoid heights?  0 = No  P13. Does turning over in bed increase your problem?  0 = No  F14. Because of your problem, is it difficult for you to do strenuous homework or yard work? 0 = No  E15. Because of your problem, are you afraid people may think you are intoxicated? 0 = No  F16. Because of your problem, is it difficult for you to go for a walk by yourself?  2 = Sometimes  P17. Does walking down a sidewalk increase your problem?  0 = No  E18.Because of your problem, is it difficult for you to concentrate 2 = Sometimes  F19. Because of your problem, is it difficult for you to walk around your house in the dark? 2 = Sometimes  E20. Because of your problem, are you afraid to stay home alone?  0 = No  E21. Because of your problem, do you feel handicapped? 0 = No  E22. Has the problem placed stress on your relationships with members of your family or friends? 2 = Sometimes  E23. Because of your problem, are you depressed?  0 = No  F24. Does your problem interfere with your job or household responsibilities?  4 = Yes  P25. Does bending over increase your problem?  2 = Sometimes  TOTAL 24    DHI Scoring Instructions  The patient is asked to answer each question as it pertains to dizziness or unsteadiness problems, specifically  considering their condition during the last month. Questions are designed to incorporate functional (F), physical  (P), and emotional (E) impacts on disability.   Scores greater than 10 points should be referred to balance specialists for further evaluation.   16-34 Points (mild handicap)  36-52 Points (moderate handicap)  54+ Points (severe handicap)  Minimally Detectable Change: 17 points ([address] [place], 1990)  [place], G. SHAUNNA. and [place], C. W. (1990). The development of the Dizziness Handicap Inventory. Archives of Otolaryngology - Head and Neck Surgery 116(4): [id number].   VESTIBULAR ASSESSMENT:  GENERAL OBSERVATION:     SYMPTOM BEHAVIOR:  Subjective history: per subjective note above  Non-Vestibular symptoms: neck pain and headaches  Type of dizziness: Lightheadedness/Faint and sensation of movement  Frequency: intermittently throughout the day dizziness  Duration:   Aggravating factors: Induced by motion: bending down to  the ground and moving head  Relieving factors: head stationary  Progression of symptoms: unchanged  OCULOMOTOR EXAM:  Ocular Alignment: normal  Ocular ROM: No Limitations  Spontaneous Nystagmus: absent  Gaze-Induced Nystagmus: absent  Smooth Pursuits: intact  Saccades: intact  Convergence/Divergence: NT cm   Cover-cross-cover test: NT  VESTIBULAR - OCULAR REFLEX:   Slow VOR: Positive Left  VOR Cancellation: Corrective Saccades, R beating nystagmus (fast phase)  Head-Impulse Test: head to knee and back up is causing sense of movement (both sides )no nystagmus  Dynamic Visual Acuity: NT   POSITIONAL TESTING: Right Dix-Hallpike: no nystagmus Left Dix-Hallpike: no nystagmus Right Roll Test: no nystagmus Left Roll Test: no nystagmus Right Sidelying: no nystagmus Left Sidelying: no nystagmus  MOTION SENSITIVITY:  Motion Sensitivity Quotient Intensity: 0 = none, 1 = Lightheaded, 2 = Mild, 3 = Moderate, 4 = Severe, 5 = Vomiting  Intensity  1. Sitting to supine   2. Supine to L side   3. Supine to R side   4. Supine to sitting   5. L Hallpike-Dix   6. Up from L    7. R Hallpike-Dix   8. Up from R    9. Sitting, head tipped to L knee   10. Head up from L knee   11. Sitting, head tipped to R knee   12. Head up from R knee   13. Sitting head turns x5   14.Sitting head nods x5   15. In stance, 180 turn to L    16. In stance, 180 turn to R     OTHOSTATICS: not orthostatic. Paient's BP is 175/95 in sitting and 200/100 in standing.   He is asymptomatic for elevated BP  FUNCTIONAL GAIT: TBD                                                                                                                              TREATMENT DATE:  02/01/24 SELF CARE: Provided education on PT POC progression.initial HEP    PATIENT EDUCATION: Education details: PT POC, initial HEP Person educated: Patient Education method: Explanation, Demonstration, Tactile cues, Verbal cues, and Handouts Education comprehension: verbalized understanding, returned demonstration, verbal cues required, tactile cues required, and needs further education  HOME EXERCISE PROGRAM: Access Code: [email or web address] URL: [email or web address] Date: 02/01/2024 Prepared by: Garnette Montclair  Exercises - Standing Gaze Stabilization with Head Rotation  - 1 x daily - 7 x weekly - 3 sets - 10 reps - Walking Gaze Stabilization Head Rotation  - 1 x daily - 7 x weekly - 3 sets - 10 reps    GOALS: Goals reviewed with patient? Yes  SHORT TERM GOALS: Target date: 02/29/2024    2.  Patient will be independent with initial HEP Baseline: assist required for correct completion Goal status: INITIAL  3.  Patient will report 25% improvement in headaches and cervical pain for improved ADL abilities Baseline: 6/10 worst Goal status: INITIAL    LONG TERM  GOALS: Target date: 03/28/2024   Patient will improve in [facility] score to </= 6/100  (MCID = 18 pts) for improved QOL Baseline: 24/100 Goal status: INITIAL   2.  Patient will demonstate improved cervical ROM to 75% in all directions for driving Baseline: see ROM tables above Goal status: INITIAL  3.  Patient will report 75-100% improvement in neck pain and headaches for improved QOL Baseline: 6/10 worst Goal status: INITIAL  4.  Patient will able to ambulate with gaze stabilization activities without dizziness or loss of balance for safety in the community with shopping Baseline: gets dizzy and loses balance with forward ambulation with gaze stabilization in clinic on eval Goal status: INITIAL  5.  Patient will score >/= to 24/30 on FGA  to reduce fall risk Baseline: TBD Goal status: INITIAL   ASSESSMENT:  CLINICAL IMPRESSION: Patient is seen today for physical therapy evaluation and treatment for BPPV.   He reports he has been getting dizzy when he stands up to the point the sometimes he staggers/stumbles.   He denies any falls, but has had to catch himself.   He states the problem has been progressively worse over recent months for no apparent reason.   HE denies any trauma, CVA, etc.   However, he has a h/o cryptogenic CVA 10 yrs ago without residual deficits per his report.   He is going to have a brain MRI soon to r/o any new pathologies.   He also reports having very high BP that has been difficult to control.   He states it is not uncommon to run above 200 systolic.     His BP is checked today and in standing is found to 200/100 and his PCP is informed of this reading.   He is asymptomatic with the elevated BP and has no c/o headache or otherwise. His BPPV testing is all negative today.   He has no dizziness or nystagmus with any positional testing.   However, VOR is impaired representing a potentially neurological source for his dizziness.  He has restricted cervical ROM with bilateral upper trapezius trigger points.  His gait is slow and guarded with difficulty with gaze stabilization and loss of balance.   PT is necessary for deficits in dizziness, cervical ROM, headaches, neck pain, posture, VOR, gait and balance.   HE is agreeable to the POC  OBJECTIVE IMPAIRMENTS: difficulty walking, decreased ROM, decreased strength, dizziness, impaired flexibility, postural dysfunction, and pain.   ACTIVITY LIMITATIONS: bending and locomotion level  PARTICIPATION LIMITATIONS: driving, shopping, and community activity  PERSONAL FACTORS: Age, Fitness, Time since onset of injury/illness/exacerbation, and 1-2 comorbidities:   cryptogenic CVA with no residual deficits 2015; HTN, renal disorder, dyslipidemia, goutare also affecting patient's  functional outcome.   REHAB POTENTIAL: Good  CLINICAL DECISION MAKING: Evolving/moderate complexity  EVALUATION COMPLEXITY: Moderate   PLAN:  PT FREQUENCY: 1-2x/week  PT DURATION: 8 weeks  PLANNED INTERVENTIONS: 97110-Therapeutic exercises, 97530- Therapeutic activity, [id number]- Neuromuscular re-education, 97535- Self Care, 02859- Manual therapy, [id number]- Gait training, [phone]- Canalith repositioning, H9716- Electrical stimulation (unattended), 97016- Vasopneumatic device, [id number]- Ultrasound, [id number]- Traction (mechanical), 20560 (1-2 muscles), 20561 (3+ muscles)- Dry Needling, Patient/Family education, Balance training, Stair training, Taping, Joint mobilization, Spinal mobilization, Cryotherapy, and Moist heat  PLAN FOR NEXT SESSION: Progress with cervical ROM/postural stabilization exercises, MFR for upper trap trigger points, VOR control   Raymon Schlarb, PT 02/01/2024, 8:40 PM  "

## 2024-01-30 NOTE — Telephone Encounter (Signed)
 I resubmitted the prior authorizations and the brain was approved but the cervical spine is pending approval-I faxed the office notes.

## 2024-02-01 ENCOUNTER — Ambulatory Visit: Attending: Neurology | Admitting: Rehabilitation

## 2024-02-01 ENCOUNTER — Encounter: Payer: Self-pay | Admitting: Rehabilitation

## 2024-02-01 ENCOUNTER — Other Ambulatory Visit: Payer: Self-pay

## 2024-02-01 DIAGNOSIS — M542 Cervicalgia: Secondary | ICD-10-CM | POA: Insufficient documentation

## 2024-02-01 DIAGNOSIS — R262 Difficulty in walking, not elsewhere classified: Secondary | ICD-10-CM | POA: Diagnosis present

## 2024-02-01 DIAGNOSIS — H8110 Benign paroxysmal vertigo, unspecified ear: Secondary | ICD-10-CM | POA: Insufficient documentation

## 2024-02-01 DIAGNOSIS — R42 Dizziness and giddiness: Secondary | ICD-10-CM | POA: Insufficient documentation

## 2024-02-01 NOTE — Telephone Encounter (Signed)
 Copied from CRM #[id number]. Topic: Clinical Concerns - Medical Question >> Feb 01, 2024 12:09 PM Kevin Jefferson wrote: Briscoe, Daniello is calling other request    Include all details related to the request(s) below:  PATIENT IS CALLING CONCERNING WEIGHT LOSS MEDICATIONS . PLEASE CALL TO DISCUSS .     Confirm and type the Best Contact Number below:  Patient/caller contact number:  [phone]           [] Home  [x] Mobile  [] Work [] Other   [x] Okay to leave a voicemail   Medication List:  Current Outpatient Medications:    amLODIPine  (NORVASC ) 10 mg tablet, Take 1 tablet (10 mg total) by mouth daily., Disp: 90 tablet, Rfl: 3   carvedilol (COREG) 25 mg tablet, Take 1 tablet (25 mg total) by mouth in the morning and 1 tablet (25 mg total) in the evening. Take with meals., Disp: 180 tablet, Rfl: 3   cyanocobalamin (VITAMIN B12) 1,000 mcg tablet, Take 1 tablet (1,000 mcg total) by mouth daily., Disp: 90 tablet, Rfl: 3   ergocalciferol (VITAMIN D2) 1,250 mcg (50,000 unit) capsule, Take 1 capsule (50,000 Units total) by mouth once a week., Disp: 12 capsule, Rfl: 3   ezetimibe (ZETIA) 10 mg tablet, Take 1 tablet (10 mg total) by mouth daily. For cholesterol, Disp: 90 tablet, Rfl: 3   folic acid (FOLVITE) 1 mg tablet, Take 1 tablet (1 mg total) by mouth daily., Disp: 90 tablet, Rfl: 3   lisinopriL  (PRINIVIL ) 20 mg tablet, Take 1 tablet (20 mg total) by mouth 2 (two) times a day., Disp: 180 tablet, Rfl: 3   rosuvastatin (CRESTOR) 40 mg tablet, Take 1 tablet (40 mg total) by mouth daily., Disp: 90 tablet, Rfl: 3     Medication Request/Refills: Pharmacy Information (if applicable)   [] Not Applicable       []  Pharmacy listed  Send Medication Request to:                                                 [] Pharmacy not listed (added to pharmacy list in Epic) Send Medication Request to:      Listed Pharmacies: [id number] [address] [place], [place] - [address] -  PHONE: [phone] - FAX: [phone]

## 2024-02-02 NOTE — Telephone Encounter (Signed)
 I called the patient and let him know that his insurance approved the brain MRI but they denied the cervical MRI stating: The following notes were requested but have not been sent: notes from your doctor that show you completed a neck exercise plan (physical therapy, a medically directed home exercise program, or chiropractic treatment) for at least six weeks or more and did not get better. These notes should say that you did those exercises in the last six months. We need details of the dates and how often you did them. Your doctor could also send your therapy notes. If you did exercises at home, we also need to know what exercises you did.  He is coming in for the brain MRI on 02/07/24 and explained to him that once he has completed 6 weeks of PT to call us  and let us  know and I will resubmit the cervical MRI and he agreed.

## 2024-02-07 ENCOUNTER — Ambulatory Visit: Admitting: Rehabilitation

## 2024-02-07 ENCOUNTER — Other Ambulatory Visit

## 2024-02-08 ENCOUNTER — Other Ambulatory Visit

## 2024-02-08 DIAGNOSIS — Z8673 Personal history of transient ischemic attack (TIA), and cerebral infarction without residual deficits: Secondary | ICD-10-CM | POA: Diagnosis not present

## 2024-02-08 DIAGNOSIS — G43009 Migraine without aura, not intractable, without status migrainosus: Secondary | ICD-10-CM

## 2024-02-08 MED ORDER — GADOBENATE DIMEGLUMINE 529 MG/ML IV SOLN
20.0000 mL | Freq: Once | INTRAVENOUS | Status: AC | PRN
  Administered 2024-02-08: 20 mL via INTRAVENOUS

## 2024-02-09 ENCOUNTER — Ambulatory Visit: Payer: Self-pay | Admitting: Neurology

## 2024-02-15 ENCOUNTER — Ambulatory Visit: Admitting: Rehabilitation

## 2024-02-15 NOTE — Therapy (Incomplete)
 " OUTPATIENT PHYSICAL THERAPY VESTIBULAR EVALUATION     Patient Name: Kevin Jefferson MRN: [id number] DOB:[birth year], 59 y.o., male Today's Date: 02/15/2024  END OF SESSION:    Past Medical History:  Diagnosis Date   Gout    Hypertension    Renal disorder    Stroke [facility])    Past Surgical History:  Procedure Laterality Date   ANKLE FRACTURE SURGERY     TEE WITHOUT CARDIOVERSION N/A 04/03/2013   Procedure: TRANSESOPHAGEAL ECHOCARDIOGRAM (TEE);  Surgeon: Redell GORMAN Shallow, MD;  Location: [facility] ENDOSCOPY;  Service: Cardiovascular;  Laterality: N/A;   Patient Active Problem List   Diagnosis Date Noted   Obesity (BMI 30-39.9) 12/28/2023   Acute gout of left foot 09/07/2022   Contusion of left ankle 09/07/2022   Accelerated hypertension 06/25/2021   Acute kidney injury 06/25/2021   Adrenal adenoma, right 01/12/2018   Aortic atherosclerosis 01/12/2018   B12 deficiency anemia 01/12/2018   Dyslipidemia, goal LDL below 70 01/12/2018   Influenza vaccination declined 01/12/2018   Vitamin D deficiency 01/12/2018   Late effect of cerebrovascular accident (CVA) 09/26/2015   Cerebral infarction (HCC) 04/03/2013   Chest pain 04/01/2013   Encephalopathy 04/01/2013   Hypertensive urgency 04/01/2013   Right sided weakness 04/01/2013   CKD (chronic kidney disease) stage 3, GFR 30-59 ml/min (HCC) 04/01/2013   Headache 04/01/2013   Left ventricular hypertrophy 10/29/2011    PCP:  REFERRING PROVIDER: Rosemarie Rothman MD  REFERRING DIAG: H81.10 (ICD-10-CM) - Benign paroxysmal positional vertigo, unspecified laterality  THERAPY DIAG:  No diagnosis found.  ONSET DATE: last 6- 8 months (difficult for patient to tell)  Rationale for Evaluation and Treatment: Rehabilitation  SUBJECTIVE:   SUBJECTIVE STATEMENT: 59 y/o patient referred to PT from Dr Rosemarie for BPPV.  He states he gets faint feeling when he stands up sometimes.   He also reports when he rolls quickly in the bed he gets dizzy feeling.    Transferring sit to and from supine does not seem to bother him per his report.      Denies any dizziness when reading.   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  He denies any difficulty with driving in the daytime hours.   Patient also reports posterior neck pain and headaches. States the headaches come and go, but have been better since he started a new BP medication about 3 weeks ago.   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 Yes: NPRS scale: 0/10 on arrival;  3-4/10 after PT today;  6/10 worst Pain location: posterior neck and upper trapezius area Pain description: aching, radiating Aggravating factors: can't think of anything that increases his pain particularly Relieving factors: rest, meds  PRECAUTIONS: None  RED FLAGS: None   WEIGHT BEARING RESTRICTIONS: No  FALLS: Has patient fallen in last 6 months? No  LIVING ENVIRONMENT: Lives with: lives with their family and lives with their spouse Lives in: House/apartment Stairs: unknown Has following equipment at home: None  PLOF: Independent with gait  PATIENT GOALS: not get dizzy and stumble, not have neck pain  OBJECTIVE:  Note: Objective measures were completed at Evaluation unless otherwise noted.  DIAGNOSTIC FINDINGS:  CLINICAL DATA:  Neuro deficit, acute, stroke suspected   EXAM: MRI HEAD WITHOUT CONTRAST   TECHNIQUE: Multiplanar, multiecho pulse sequences of the brain and surrounding structures  were obtained without intravenous contrast.   COMPARISON:  Same day CT head.  MRI 01/25/2014.   FINDINGS: Motion limited study.   Brain: No acute infarction, hemorrhage, hydrocephalus, extra-axial collection or mass lesion. Remote lacunar infarct in the left corona radiata. A few additional T2/FLAIR hyperintensities in  the white matter, nonspecific but compatible with chronic microvascular ischemic disease   Vascular: Major arterial flow voids are maintained skull base.   Skull and upper cervical spine: Normal marrow signal.   Sinuses/Orbits: Mild paranasal sinus mucosal thickening. Unremarkable orbits.   Other: No sizable mastoid effusions   IMPRESSION: 1. No evidence of acute abnormality on this motion limited study. 2. Remote lacunar infarct in the left corona radiata.     Electronically Signed   By: Gilmore GORMAN Molt M.D.   On: 03/01/2021 15:28    COGNITION: Overall cognitive status: Within functional limits for tasks assessed   SENSATION: WFL  EDEMA:  None noted  MUSCLE TONE:  NT  DTRs:  NT  POSTURE:  rounded shoulders and forward head  Cervical ROM:    Active A/PROM (deg) eval  Flexion 100%  Extension 40%  Right lateral flexion 25%  Left lateral flexion 25%  Right rotation 60%  Left rotation 40%  (Blank rows = not tested)  STRENGTH: UPPER EXTREMITY MMT:  MMT Right eval Left eval  Shoulder flexion 5 5  Shoulder extension    Shoulder abduction 5 5  Shoulder adduction    Shoulder extension    Shoulder internal rotation 5 5  Shoulder external rotation 5 5  Middle trapezius    Lower trapezius    Elbow flexion 5 5  Elbow extension 5 5  Wrist flexion    Wrist extension    Wrist ulnar deviation    Wrist radial deviation    Wrist pronation    Wrist supination    Grip strength     (Blank rows = not tested)   LOWER EXTREMITY MMT:   MMT Right eval Left eval  Hip flexion    Hip abduction    Hip adduction    Hip internal rotation    Hip external rotation    Knee flexion    Knee extension    Ankle dorsiflexion    Ankle plantarflexion    Ankle inversion    Ankle eversion    (Blank rows = not tested)  BED MOBILITY:  Sit to supine Complete Independence Supine to sit Complete Independence  TRANSFERS: Assistive device utilized: None  Sit to  stand: Complete Independence Stand to sit: Complete Independence Chair to chair: Complete Independence Floor: NT  GAIT: Gait pattern: slow cadence, somewhat tentative, holds cervical spine stationary with no movement during gait Distance walked: into clinic x 150' Assistive device utilized: None Level of assistance: Complete Independence Comments:   FUNCTIONAL TESTS:  TBD  PATIENT SURVEYS:  DHI: THE DIZZINESS HANDICAP INVENTORY (DHI)           02/01/24 P1. Does looking up increase your problem? 0 = No  E2. Because of your problem, do you feel frustrated? 2 = Sometimes  F3. Because of your problem, do you restrict your travel for business or recreation?  0 = No  P4. Does walking down the aisle of a supermarket increase your problems?  0 = No  F5. Because of your problem, do you have difficulty getting into or out of bed?  0 = No  F6. Does your problem significantly restrict your participation in social activities, such as going out  to dinner, going to the movies, dancing, or going to parties? 0 = No  F7. Because of your problem, do you have difficulty reading?  0 = No  P8. Does performing more ambitious activities such as sports, dancing, household chores (sweeping or putting dishes away) increase your problems?  0 = No  E9. Because of your problem, are you afraid to leave your home without having without having someone accompany you?  2 = Sometimes  E10. Because of your problem have you been embarrassed in front of others?  4 = Yes  P11. Do quick movements of your head increase your problem?  2 = Sometimes  F12. Because of your problem, do you avoid heights?  0 = No  P13. Does turning over in bed increase your problem?  0 = No  F14. Because of your problem, is it difficult for you to do strenuous homework or yard work? 0 = No  E15. Because of your problem, are you afraid people may think you are intoxicated? 0 = No  F16. Because of your problem, is it difficult for you to go for a  walk by yourself?  2 = Sometimes  P17. Does walking down a sidewalk increase your problem?  0 = No  E18.Because of your problem, is it difficult for you to concentrate 2 = Sometimes  F19. Because of your problem, is it difficult for you to walk around your house in the dark? 2 = Sometimes  E20. Because of your problem, are you afraid to stay home alone?  0 = No  E21. Because of your problem, do you feel handicapped? 0 = No  E22. Has the problem placed stress on your relationships with members of your family or friends? 2 = Sometimes  E23. Because of your problem, are you depressed?  0 = No  F24. Does your problem interfere with your job or household responsibilities?  4 = Yes  P25. Does bending over increase your problem?  2 = Sometimes  TOTAL 24    DHI Scoring Instructions  The patient is asked to answer each question as it pertains to dizziness or unsteadiness problems, specifically  considering their condition during the last month. Questions are designed to incorporate functional (F), physical  (P), and emotional (E) impacts on disability.   Scores greater than 10 points should be referred to balance specialists for further evaluation.   16-34 Points (mild handicap)  36-52 Points (moderate handicap)  54+ Points (severe handicap)  Minimally Detectable Change: 17 points ([address] [place], 1990)  [place], G. SHAUNNA. and [place], C. W. (1990). The development of the Dizziness Handicap Inventory. Archives of Otolaryngology - Head and Neck Surgery 116(4): [id number].   VESTIBULAR ASSESSMENT:  GENERAL OBSERVATION:    SYMPTOM BEHAVIOR:  Subjective history: per subjective note above  Non-Vestibular symptoms: neck pain and headaches  Type of dizziness: Lightheadedness/Faint and sensation of movement  Frequency: intermittently throughout the day dizziness  Duration:   Aggravating factors: Induced by motion: bending down to the ground and moving head  Relieving factors: head  stationary  Progression of symptoms: unchanged  OCULOMOTOR EXAM:  Ocular Alignment: normal  Ocular ROM: No Limitations  Spontaneous Nystagmus: absent  Gaze-Induced Nystagmus: absent  Smooth Pursuits: intact  Saccades: intact  Convergence/Divergence: NT cm   Cover-cross-cover test: NT  VESTIBULAR - OCULAR REFLEX:   Slow VOR: Positive Left  VOR Cancellation: Corrective Saccades, R beating nystagmus (fast phase)  Head-Impulse Test: head to knee and back up is causing  sense of movement (both sides )no nystagmus  Dynamic Visual Acuity: NT   POSITIONAL TESTING: Right Dix-Hallpike: no nystagmus Left Dix-Hallpike: no nystagmus Right Roll Test: no nystagmus Left Roll Test: no nystagmus Right Sidelying: no nystagmus Left Sidelying: no nystagmus  MOTION SENSITIVITY:  Motion Sensitivity Quotient Intensity: 0 = none, 1 = Lightheaded, 2 = Mild, 3 = Moderate, 4 = Severe, 5 = Vomiting  Intensity  1. Sitting to supine   2. Supine to L side   3. Supine to R side   4. Supine to sitting   5. L Hallpike-Dix   6. Up from L    7. R Hallpike-Dix   8. Up from R    9. Sitting, head tipped to L knee   10. Head up from L knee   11. Sitting, head tipped to R knee   12. Head up from R knee   13. Sitting head turns x5   14.Sitting head nods x5   15. In stance, 180 turn to L    16. In stance, 180 turn to R     OTHOSTATICS: not orthostatic. Paient's BP is 175/95 in sitting and 200/100 in standing.   He is asymptomatic for elevated BP  FUNCTIONAL GAIT: TBD                                                                                                                             TREATMENT DATE:   UBE  UT MFR Cervical SB stretching Gaze stabilization with standing and walking and in tandem   02/01/24 SELF CARE: Provided education on PT POC progression.initial HEP    PATIENT EDUCATION: Education details: PT POC, initial HEP Person educated: Patient Education method: Explanation,  Demonstration, Tactile cues, Verbal cues, and Handouts Education comprehension: verbalized understanding, returned demonstration, verbal cues required, tactile cues required, and needs further education  HOME EXERCISE PROGRAM: Access Code: [email or web address] URL: [email or web address] Date: 02/01/2024 Prepared by: Garnette Montclair  Exercises - Standing Gaze Stabilization with Head Rotation  - 1 x daily - 7 x weekly - 3 sets - 10 reps - Walking Gaze Stabilization Head Rotation  - 1 x daily - 7 x weekly - 3 sets - 10 reps    GOALS: Goals reviewed with patient? Yes  SHORT TERM GOALS: Target date: 02/29/2024    2.  Patient will be independent with initial HEP Baseline: assist required for correct completion Goal status: INITIAL  3.  Patient will report 25% improvement in headaches and cervical pain for improved ADL abilities Baseline: 6/10 worst Goal status: INITIAL    LONG TERM GOALS: Target date: 03/28/2024   Patient will improve in [facility] score to </= 6/100  (MCID = 18 pts) for improved QOL Baseline: 24/100 Goal status: INITIAL   2.  Patient will demonstate improved cervical ROM to 75% in all directions for driving Baseline: see ROM tables above Goal status: INITIAL  3.  Patient will report 75-100% improvement in neck pain  and headaches for improved QOL Baseline: 6/10 worst Goal status: INITIAL  4.  Patient will able to ambulate with gaze stabilization activities without dizziness or loss of balance for safety in the community with shopping Baseline: gets dizzy and loses balance with forward ambulation with gaze stabilization in clinic on eval Goal status: INITIAL  5.  Patient will score >/= to 24/30 on FGA to reduce fall risk Baseline: TBD Goal status: INITIAL   ASSESSMENT:  CLINICAL IMPRESSION: Patient is seen today for physical therapy evaluation and treatment for BPPV.   He reports he has been getting dizzy when he stands up to the point the sometimes he  staggers/stumbles.   He denies any falls, but has had to catch himself.   He states the problem has been progressively worse over recent months for no apparent reason.   HE denies any trauma, CVA, etc.   However, he has a h/o cryptogenic CVA 10 yrs ago without residual deficits per his report.   He is going to have a brain MRI soon to r/o any new pathologies.   He also reports having very high BP that has been difficult to control.   He states it is not uncommon to run above 200 systolic.     His BP is checked today and in standing is found to 200/100 and his PCP is informed of this reading.   He is asymptomatic with the elevated BP and has no c/o headache or otherwise. His BPPV testing is all negative today.   He has no dizziness or nystagmus with any positional testing.   However, VOR is impaired representing a potentially neurological source for his dizziness.  He has restricted cervical ROM with bilateral upper trapezius trigger points.  His gait is slow and guarded with difficulty with gaze stabilization and loss of balance.   PT is necessary for deficits in dizziness, cervical ROM, headaches, neck pain, posture, VOR, gait and balance.   HE is agreeable to the POC  OBJECTIVE IMPAIRMENTS: difficulty walking, decreased ROM, decreased strength, dizziness, impaired flexibility, postural dysfunction, and pain.   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   REHAB POTENTIAL: Good  CLINICAL DECISION MAKING: Evolving/moderate complexity  EVALUATION COMPLEXITY: Moderate   PLAN:  PT FREQUENCY: 1-2x/week  PT DURATION: 8 weeks  PLANNED INTERVENTIONS: 97110-Therapeutic exercises, 97530-  Therapeutic activity, [id number]- Neuromuscular re-education, 97535- Self Care, [id number]- Manual therapy, [id number]- Gait training, [phone]- Canalith repositioning, H9716- Electrical stimulation (unattended), 97016- Vasopneumatic device, [id number]- Ultrasound, [id number]- Traction (mechanical), 20560 (1-2 muscles), 20561 (3+ muscles)- Dry Needling, Patient/Family education, Balance training, Stair training, Taping, Joint mobilization, Spinal mobilization, Cryotherapy, and Moist heat  PLAN FOR NEXT SESSION: Progress with cervical ROM/postural stabilization exercises, MFR for upper trap trigger points, VOR control   Troye Hiemstra, PT 02/15/2024, 10:14 AM  "

## 2024-02-21 ENCOUNTER — Ambulatory Visit: Admitting: Rehabilitation
